# Patient Record
Sex: Male | Born: 1935 | Race: White | Hispanic: No | Smoking: Never smoker
Health system: Southern US, Community
[De-identification: ages and names within clinical notes are randomized; demographics above are authoritative.]

## PROBLEM LIST (undated history)

## (undated) DIAGNOSIS — Z87442 Personal history of urinary calculi: Secondary | ICD-10-CM

## (undated) DIAGNOSIS — I219 Acute myocardial infarction, unspecified: Secondary | ICD-10-CM

## (undated) DIAGNOSIS — I2510 Atherosclerotic heart disease of native coronary artery without angina pectoris: Secondary | ICD-10-CM

## (undated) DIAGNOSIS — E663 Overweight: Secondary | ICD-10-CM

## (undated) DIAGNOSIS — H9190 Unspecified hearing loss, unspecified ear: Secondary | ICD-10-CM

## (undated) DIAGNOSIS — I10 Essential (primary) hypertension: Secondary | ICD-10-CM

## (undated) DIAGNOSIS — I4891 Unspecified atrial fibrillation: Secondary | ICD-10-CM

## (undated) DIAGNOSIS — D699 Hemorrhagic condition, unspecified: Secondary | ICD-10-CM

## (undated) DIAGNOSIS — E785 Hyperlipidemia, unspecified: Secondary | ICD-10-CM

## (undated) DIAGNOSIS — N200 Calculus of kidney: Secondary | ICD-10-CM

## (undated) DIAGNOSIS — I509 Heart failure, unspecified: Secondary | ICD-10-CM

## (undated) HISTORY — DX: Hyperlipidemia, unspecified: E78.5

## (undated) HISTORY — DX: Calculus of kidney: N20.0

## (undated) HISTORY — DX: Heart failure, unspecified: I50.9

## (undated) HISTORY — DX: Hemorrhagic condition, unspecified: D69.9

## (undated) HISTORY — DX: Unspecified atrial fibrillation: I48.91

## (undated) HISTORY — DX: Essential (primary) hypertension: I10

## (undated) HISTORY — DX: Atherosclerotic heart disease of native coronary artery without angina pectoris: I25.10

## (undated) HISTORY — DX: Overweight: E66.3

---

## 1989-06-30 HISTORY — PX: ANGIOPLASTY: SHX39

## 2004-07-02 ENCOUNTER — Ambulatory Visit: Payer: Self-pay | Admitting: *Deleted

## 2005-04-28 ENCOUNTER — Inpatient Hospital Stay (HOSPITAL_COMMUNITY): Admission: EM | Admit: 2005-04-28 | Discharge: 2005-05-08 | Payer: Self-pay | Admitting: Emergency Medicine

## 2005-04-28 ENCOUNTER — Encounter: Payer: Self-pay | Admitting: Emergency Medicine

## 2005-04-29 ENCOUNTER — Ambulatory Visit: Payer: Self-pay | Admitting: Cardiology

## 2005-04-29 ENCOUNTER — Encounter: Payer: Self-pay | Admitting: Cardiology

## 2005-04-29 ENCOUNTER — Ambulatory Visit: Payer: Self-pay | Admitting: *Deleted

## 2005-04-30 HISTORY — PX: CORONARY ARTERY BYPASS GRAFT: SHX141

## 2005-05-20 ENCOUNTER — Ambulatory Visit (HOSPITAL_COMMUNITY): Admission: RE | Admit: 2005-05-20 | Discharge: 2005-05-20 | Payer: Self-pay | Admitting: *Deleted

## 2005-05-20 ENCOUNTER — Ambulatory Visit: Payer: Self-pay | Admitting: *Deleted

## 2005-06-02 ENCOUNTER — Encounter (HOSPITAL_COMMUNITY): Admission: RE | Admit: 2005-06-02 | Discharge: 2005-06-27 | Payer: Self-pay | Admitting: *Deleted

## 2005-06-06 ENCOUNTER — Ambulatory Visit: Payer: Self-pay | Admitting: Cardiology

## 2005-06-11 ENCOUNTER — Ambulatory Visit: Payer: Self-pay | Admitting: *Deleted

## 2005-07-02 ENCOUNTER — Ambulatory Visit: Payer: Self-pay | Admitting: *Deleted

## 2005-07-02 ENCOUNTER — Ambulatory Visit (HOSPITAL_COMMUNITY): Admission: RE | Admit: 2005-07-02 | Discharge: 2005-07-02 | Payer: Self-pay | Admitting: *Deleted

## 2005-07-04 ENCOUNTER — Encounter (HOSPITAL_COMMUNITY): Admission: RE | Admit: 2005-07-04 | Discharge: 2005-08-03 | Payer: Self-pay | Admitting: *Deleted

## 2005-08-04 ENCOUNTER — Encounter (HOSPITAL_COMMUNITY): Admission: RE | Admit: 2005-08-04 | Discharge: 2005-09-03 | Payer: Self-pay | Admitting: *Deleted

## 2005-08-06 ENCOUNTER — Ambulatory Visit: Payer: Self-pay | Admitting: *Deleted

## 2005-09-02 ENCOUNTER — Ambulatory Visit: Payer: Self-pay | Admitting: *Deleted

## 2005-09-05 ENCOUNTER — Encounter (HOSPITAL_COMMUNITY): Admission: RE | Admit: 2005-09-05 | Discharge: 2005-10-05 | Payer: Self-pay | Admitting: *Deleted

## 2005-09-08 ENCOUNTER — Ambulatory Visit: Payer: Self-pay | Admitting: Cardiology

## 2005-09-23 ENCOUNTER — Ambulatory Visit: Payer: Self-pay | Admitting: *Deleted

## 2005-09-26 ENCOUNTER — Ambulatory Visit: Payer: Self-pay | Admitting: Cardiology

## 2005-09-26 ENCOUNTER — Ambulatory Visit (HOSPITAL_COMMUNITY): Admission: RE | Admit: 2005-09-26 | Discharge: 2005-09-26 | Payer: Self-pay | Admitting: *Deleted

## 2005-10-14 ENCOUNTER — Ambulatory Visit: Payer: Self-pay | Admitting: *Deleted

## 2005-10-28 ENCOUNTER — Ambulatory Visit: Payer: Self-pay | Admitting: *Deleted

## 2005-11-21 ENCOUNTER — Ambulatory Visit: Payer: Self-pay | Admitting: *Deleted

## 2005-12-08 ENCOUNTER — Ambulatory Visit: Payer: Self-pay | Admitting: *Deleted

## 2006-01-01 ENCOUNTER — Ambulatory Visit: Payer: Self-pay | Admitting: Internal Medicine

## 2006-01-06 ENCOUNTER — Ambulatory Visit: Payer: Self-pay | Admitting: Cardiology

## 2006-02-19 ENCOUNTER — Ambulatory Visit: Payer: Self-pay | Admitting: Cardiology

## 2006-03-11 ENCOUNTER — Ambulatory Visit: Payer: Self-pay | Admitting: Cardiology

## 2006-04-09 ENCOUNTER — Ambulatory Visit: Payer: Self-pay | Admitting: Cardiology

## 2006-04-25 ENCOUNTER — Emergency Department (HOSPITAL_COMMUNITY): Admission: EM | Admit: 2006-04-25 | Discharge: 2006-04-25 | Payer: Self-pay | Admitting: Obstetrics & Gynecology

## 2006-05-12 ENCOUNTER — Ambulatory Visit: Payer: Self-pay | Admitting: Cardiology

## 2006-06-09 ENCOUNTER — Ambulatory Visit: Payer: Self-pay | Admitting: Cardiology

## 2006-06-30 HISTORY — PX: COLONOSCOPY: SHX174

## 2006-07-07 ENCOUNTER — Ambulatory Visit: Payer: Self-pay | Admitting: Cardiology

## 2006-08-11 ENCOUNTER — Ambulatory Visit: Payer: Self-pay | Admitting: Cardiology

## 2006-08-12 ENCOUNTER — Ambulatory Visit (HOSPITAL_COMMUNITY): Admission: RE | Admit: 2006-08-12 | Discharge: 2006-08-12 | Payer: Self-pay | Admitting: Cardiology

## 2006-09-14 ENCOUNTER — Ambulatory Visit: Payer: Self-pay | Admitting: Cardiology

## 2006-09-16 ENCOUNTER — Ambulatory Visit: Payer: Self-pay | Admitting: Internal Medicine

## 2006-09-16 ENCOUNTER — Ambulatory Visit (HOSPITAL_COMMUNITY): Admission: RE | Admit: 2006-09-16 | Discharge: 2006-09-16 | Payer: Self-pay | Admitting: Internal Medicine

## 2006-09-25 ENCOUNTER — Ambulatory Visit: Payer: Self-pay | Admitting: Cardiology

## 2006-10-08 ENCOUNTER — Ambulatory Visit: Payer: Self-pay | Admitting: Cardiology

## 2006-11-09 ENCOUNTER — Ambulatory Visit: Payer: Self-pay | Admitting: Cardiology

## 2006-11-20 ENCOUNTER — Emergency Department (HOSPITAL_COMMUNITY): Admission: EM | Admit: 2006-11-20 | Discharge: 2006-11-20 | Payer: Self-pay | Admitting: Emergency Medicine

## 2006-12-14 ENCOUNTER — Ambulatory Visit: Payer: Self-pay | Admitting: Cardiology

## 2006-12-22 ENCOUNTER — Ambulatory Visit: Payer: Self-pay | Admitting: Cardiology

## 2007-01-19 ENCOUNTER — Emergency Department (HOSPITAL_COMMUNITY): Admission: EM | Admit: 2007-01-19 | Discharge: 2007-01-19 | Payer: Self-pay | Admitting: Emergency Medicine

## 2007-01-20 ENCOUNTER — Ambulatory Visit: Payer: Self-pay | Admitting: Cardiovascular Disease

## 2007-02-17 ENCOUNTER — Ambulatory Visit: Payer: Self-pay | Admitting: Cardiology

## 2007-03-22 ENCOUNTER — Ambulatory Visit: Payer: Self-pay | Admitting: Cardiology

## 2007-04-26 ENCOUNTER — Ambulatory Visit: Payer: Self-pay | Admitting: Internal Medicine

## 2007-05-05 ENCOUNTER — Ambulatory Visit: Payer: Self-pay | Admitting: Cardiology

## 2007-05-12 ENCOUNTER — Ambulatory Visit: Payer: Self-pay | Admitting: Cardiovascular Disease

## 2007-05-24 ENCOUNTER — Encounter (HOSPITAL_COMMUNITY): Admission: RE | Admit: 2007-05-24 | Discharge: 2007-06-23 | Payer: Self-pay | Admitting: Cardiology

## 2007-05-27 ENCOUNTER — Ambulatory Visit: Payer: Self-pay | Admitting: Cardiology

## 2007-07-05 ENCOUNTER — Ambulatory Visit: Payer: Self-pay | Admitting: Cardiology

## 2008-02-28 ENCOUNTER — Ambulatory Visit: Payer: Self-pay | Admitting: Cardiology

## 2009-03-06 ENCOUNTER — Encounter: Payer: Self-pay | Admitting: Cardiology

## 2009-03-06 ENCOUNTER — Encounter (INDEPENDENT_AMBULATORY_CARE_PROVIDER_SITE_OTHER): Payer: Self-pay | Admitting: *Deleted

## 2009-03-06 LAB — CONVERTED CEMR LAB
ALT: 15 units/L
ALT: 15 units/L (ref 0–53)
AST: 16 units/L
AST: 16 units/L (ref 0–37)
Albumin: 4 g/dL
Albumin: 4 g/dL (ref 3.5–5.2)
Alkaline Phosphatase: 80 units/L
Alkaline Phosphatase: 80 units/L (ref 39–117)
BUN: 18 mg/dL
BUN: 18 mg/dL (ref 6–23)
CO2: 25 meq/L
CO2: 25 meq/L (ref 19–32)
Calcium: 8.8 mg/dL
Calcium: 8.8 mg/dL (ref 8.4–10.5)
Chloride: 105 meq/L
Chloride: 105 meq/L (ref 96–112)
Cholesterol: 134 mg/dL
Cholesterol: 134 mg/dL (ref 0–200)
Creatinine, Ser: 1.04 mg/dL
Creatinine, Ser: 1.04 mg/dL (ref 0.40–1.50)
Glucose, Bld: 115 mg/dL
Glucose, Bld: 115 mg/dL — ABNORMAL HIGH (ref 70–99)
HDL: 48 mg/dL
HDL: 48 mg/dL (ref >39)
LDL Cholesterol: 70 mg/dL
LDL Cholesterol: 70 mg/dL (ref 0–99)
Potassium: 4.9 meq/L
Potassium: 4.9 meq/L (ref 3.5–5.3)
Sodium: 140 meq/L
Sodium: 140 meq/L (ref 135–145)
Total Bilirubin: 0.9 mg/dL (ref 0.3–1.2)
Total CHOL/HDL Ratio: 2.8
Total Protein: 7.1 g/dL
Total Protein: 7.1 g/dL (ref 6.0–8.3)
Triglycerides: 81 mg/dL
Triglycerides: 81 mg/dL (ref <150)
VLDL: 16 mg/dL (ref 0–40)

## 2009-03-07 ENCOUNTER — Encounter (INDEPENDENT_AMBULATORY_CARE_PROVIDER_SITE_OTHER): Payer: Self-pay | Admitting: *Deleted

## 2009-07-23 ENCOUNTER — Telehealth (INDEPENDENT_AMBULATORY_CARE_PROVIDER_SITE_OTHER): Payer: Self-pay

## 2009-07-25 ENCOUNTER — Encounter (INDEPENDENT_AMBULATORY_CARE_PROVIDER_SITE_OTHER): Payer: Self-pay | Admitting: *Deleted

## 2009-08-07 ENCOUNTER — Ambulatory Visit: Payer: Self-pay | Admitting: Cardiology

## 2009-08-07 ENCOUNTER — Encounter: Payer: Self-pay | Admitting: Adult Health

## 2009-08-07 DIAGNOSIS — I10 Essential (primary) hypertension: Secondary | ICD-10-CM

## 2009-08-14 ENCOUNTER — Encounter: Payer: Self-pay | Admitting: Adult Health

## 2009-08-14 ENCOUNTER — Encounter (INDEPENDENT_AMBULATORY_CARE_PROVIDER_SITE_OTHER): Payer: Self-pay | Admitting: *Deleted

## 2009-08-14 LAB — CONVERTED CEMR LAB
ALT: 16 units/L
ALT: 16 units/L
ALT: 16 units/L (ref 0–53)
AST: 16 units/L
AST: 16 units/L
AST: 16 units/L (ref 0–37)
Albumin: 4 g/dL
Albumin: 4 g/dL
Albumin: 4 g/dL (ref 3.5–5.2)
Alkaline Phosphatase: 88 units/L
Alkaline Phosphatase: 88 units/L
Alkaline Phosphatase: 88 units/L (ref 39–117)
BUN: 25 mg/dL
BUN: 25 mg/dL
BUN: 25 mg/dL — ABNORMAL HIGH (ref 6–23)
Bilirubin, Direct: 0.1 mg/dL
Bilirubin, Direct: 0.1 mg/dL
Bilirubin, Direct: 0.1 mg/dL (ref 0.0–0.3)
CO2: 25 meq/L
CO2: 25 meq/L
CO2: 25 meq/L (ref 19–32)
Calcium: 8.9 mg/dL
Calcium: 8.9 mg/dL
Calcium: 8.9 mg/dL (ref 8.4–10.5)
Chloride: 105 meq/L
Chloride: 105 meq/L
Chloride: 105 meq/L (ref 96–112)
Cholesterol: 127 mg/dL
Cholesterol: 127 mg/dL
Cholesterol: 127 mg/dL (ref 0–200)
Creatinine, Ser: 0.96 mg/dL
Creatinine, Ser: 0.96 mg/dL
Creatinine, Ser: 0.96 mg/dL (ref 0.40–1.50)
Glucose, Bld: 109 mg/dL
Glucose, Bld: 109 mg/dL
Glucose, Bld: 109 mg/dL — ABNORMAL HIGH (ref 70–99)
HDL: 42 mg/dL
HDL: 42 mg/dL
HDL: 42 mg/dL (ref >39)
Indirect Bilirubin: 0.5 mg/dL (ref 0.0–0.9)
LDL Cholesterol: 73 mg/dL
LDL Cholesterol: 73 mg/dL
LDL Cholesterol: 73 mg/dL (ref 0–99)
Potassium: 4.7 meq/L
Potassium: 4.7 meq/L
Potassium: 4.7 meq/L (ref 3.5–5.3)
Sodium: 141 meq/L
Sodium: 141 meq/L
Sodium: 141 meq/L (ref 135–145)
Total Bilirubin: 0.6 mg/dL (ref 0.3–1.2)
Total CHOL/HDL Ratio: 3
Total Protein: 6.9 g/dL
Total Protein: 6.9 g/dL
Total Protein: 6.9 g/dL (ref 6.0–8.3)
Triglycerides: 61 mg/dL
Triglycerides: 61 mg/dL
Triglycerides: 61 mg/dL (ref <150)
VLDL: 12 mg/dL (ref 0–40)

## 2009-09-10 ENCOUNTER — Encounter (INDEPENDENT_AMBULATORY_CARE_PROVIDER_SITE_OTHER): Payer: Self-pay | Admitting: *Deleted

## 2009-09-13 ENCOUNTER — Ambulatory Visit: Payer: Self-pay | Admitting: Cardiology

## 2009-09-13 LAB — CONVERTED CEMR LAB: POC INR: 1.1

## 2009-09-24 ENCOUNTER — Ambulatory Visit: Payer: Self-pay | Admitting: Cardiology

## 2009-09-24 LAB — CONVERTED CEMR LAB: POC INR: 2.9

## 2009-10-04 ENCOUNTER — Ambulatory Visit: Payer: Self-pay | Admitting: Cardiology

## 2009-10-04 LAB — CONVERTED CEMR LAB: POC INR: 5

## 2009-10-11 ENCOUNTER — Ambulatory Visit: Payer: Self-pay | Admitting: Cardiology

## 2009-10-11 LAB — CONVERTED CEMR LAB: POC INR: 2.1

## 2009-10-25 ENCOUNTER — Ambulatory Visit: Payer: Self-pay | Admitting: Cardiology

## 2009-10-25 LAB — CONVERTED CEMR LAB: POC INR: 2.4

## 2009-11-15 ENCOUNTER — Ambulatory Visit: Payer: Self-pay | Admitting: Cardiology

## 2009-11-15 LAB — CONVERTED CEMR LAB: POC INR: 2.8

## 2009-12-13 ENCOUNTER — Ambulatory Visit: Payer: Self-pay | Admitting: Cardiology

## 2009-12-13 ENCOUNTER — Encounter (INDEPENDENT_AMBULATORY_CARE_PROVIDER_SITE_OTHER): Payer: Self-pay | Admitting: *Deleted

## 2009-12-13 LAB — CONVERTED CEMR LAB: POC INR: 2

## 2009-12-18 DIAGNOSIS — E663 Overweight: Secondary | ICD-10-CM | POA: Insufficient documentation

## 2009-12-18 DIAGNOSIS — E785 Hyperlipidemia, unspecified: Secondary | ICD-10-CM | POA: Insufficient documentation

## 2009-12-18 DIAGNOSIS — N200 Calculus of kidney: Secondary | ICD-10-CM | POA: Insufficient documentation

## 2009-12-19 ENCOUNTER — Ambulatory Visit: Payer: Self-pay | Admitting: Cardiology

## 2009-12-19 ENCOUNTER — Encounter (INDEPENDENT_AMBULATORY_CARE_PROVIDER_SITE_OTHER): Payer: Self-pay | Admitting: *Deleted

## 2009-12-19 LAB — CONVERTED CEMR LAB
Basophils Absolute: 0 10*3/uL
Basophils Absolute: 0 10*3/uL (ref 0.0–0.1)
Basophils Relative: 0 %
Basophils Relative: 0 % (ref 0–1)
Eosinophils Absolute: 0.3 10*3/uL (ref 0.0–0.7)
Eosinophils Relative: 3 %
Eosinophils Relative: 3 % (ref 0–5)
HCT: 44.6 %
HCT: 44.6 % (ref 39.0–52.0)
Hemoglobin: 14.9 g/dL
Hemoglobin: 14.9 g/dL (ref 13.0–17.0)
Lymphocytes Relative: 28 % (ref 12–46)
Lymphs Abs: 2.3 10*3/uL (ref 0.7–4.0)
MCHC: 33.4 g/dL (ref 30.0–36.0)
MCV: 96.5 fL
MCV: 96.5 fL (ref 78.0–100.0)
Monocytes Absolute: 0.9 10*3/uL
Monocytes Absolute: 0.9 10*3/uL (ref 0.1–1.0)
Monocytes Relative: 11 % (ref 3–12)
Neutro Abs: 4.6 10*3/uL (ref 1.7–7.7)
Neutrophils Relative %: 58 % (ref 43–77)
Platelets: 201 10*3/uL (ref 150–400)
RBC: 4.62 M/uL
RBC: 4.62 M/uL (ref 4.22–5.81)
RDW: 14.8 % (ref 11.5–15.5)
WBC: 8.1 10*3/uL
WBC: 8.1 10*3/uL (ref 4.0–10.5)

## 2009-12-20 ENCOUNTER — Encounter: Payer: Self-pay | Admitting: Cardiology

## 2009-12-28 ENCOUNTER — Encounter: Payer: Self-pay | Admitting: Cardiology

## 2009-12-28 ENCOUNTER — Ambulatory Visit: Payer: Self-pay | Admitting: Internal Medicine

## 2009-12-28 ENCOUNTER — Ambulatory Visit (HOSPITAL_COMMUNITY): Admission: RE | Admit: 2009-12-28 | Discharge: 2009-12-28 | Payer: Self-pay | Admitting: Cardiology

## 2010-01-09 ENCOUNTER — Ambulatory Visit: Payer: Self-pay | Admitting: Cardiology

## 2010-01-09 LAB — CONVERTED CEMR LAB: POC INR: 2.3

## 2010-02-07 ENCOUNTER — Ambulatory Visit: Payer: Self-pay | Admitting: Cardiology

## 2010-02-07 LAB — CONVERTED CEMR LAB: POC INR: 2.4

## 2010-03-01 ENCOUNTER — Encounter (INDEPENDENT_AMBULATORY_CARE_PROVIDER_SITE_OTHER): Payer: Self-pay | Admitting: *Deleted

## 2010-03-07 ENCOUNTER — Ambulatory Visit: Payer: Self-pay | Admitting: Cardiology

## 2010-04-04 ENCOUNTER — Ambulatory Visit: Payer: Self-pay | Admitting: Cardiology

## 2010-05-02 ENCOUNTER — Ambulatory Visit: Payer: Self-pay | Admitting: Cardiology

## 2010-06-03 ENCOUNTER — Ambulatory Visit: Payer: Self-pay | Admitting: Cardiology

## 2010-06-10 ENCOUNTER — Ambulatory Visit: Payer: Self-pay | Admitting: Cardiology

## 2010-06-19 ENCOUNTER — Ambulatory Visit: Payer: Self-pay | Admitting: Cardiology

## 2010-06-19 LAB — CONVERTED CEMR LAB: POC INR: 3.3

## 2010-07-09 ENCOUNTER — Encounter (INDEPENDENT_AMBULATORY_CARE_PROVIDER_SITE_OTHER): Payer: Self-pay | Admitting: *Deleted

## 2010-07-10 ENCOUNTER — Ambulatory Visit: Admission: RE | Admit: 2010-07-10 | Discharge: 2010-07-10 | Payer: Self-pay | Source: Home / Self Care

## 2010-07-10 ENCOUNTER — Encounter (INDEPENDENT_AMBULATORY_CARE_PROVIDER_SITE_OTHER): Payer: Self-pay | Admitting: *Deleted

## 2010-07-10 ENCOUNTER — Ambulatory Visit
Admission: RE | Admit: 2010-07-10 | Discharge: 2010-07-10 | Payer: Self-pay | Source: Home / Self Care | Attending: Cardiology | Admitting: Cardiology

## 2010-07-11 ENCOUNTER — Encounter: Payer: Self-pay | Admitting: Cardiology

## 2010-07-15 LAB — CONVERTED CEMR LAB
AST: 16 units/L (ref 0–37)
BUN: 20 mg/dL (ref 6–23)
Calcium: 8.9 mg/dL (ref 8.4–10.5)
Chloride: 102 meq/L (ref 96–112)
Creatinine, Ser: 1.02 mg/dL (ref 0.40–1.50)
HDL: 44 mg/dL (ref >39)
TSH: 1.247 microintl units/mL (ref 0.350–4.500)
Total CHOL/HDL Ratio: 3.3

## 2010-08-01 NOTE — Medication Information (Signed)
Summary: ccr-lr  Anticoagulant Therapy  Managed by: Vashti Hey, RN PCP: Dr.Scott Gerda Diss Supervising MD: Daleen Squibb MD, Maisie Fus Indication 1: Atrial Fibrillation Lab Used: [facility] [place] Site: [place] INR POC 1.2  Dietary changes: no    Health status changes: no    Bleeding/hemorrhagic complications: no    Recent/future hospitalizations: no    Any changes in medication regimen? no    Recent/future dental: no  Any missed doses?: yes     Details: denies missing doses or med changes  Is patient compliant with meds? yes       Allergies: 1)  ! Ibuprofen  Anticoagulation Management History:      The patient is taking warfarin and comes in today for a routine follow up visit.  Warfarin therapy is being given due to chronic atrial fibrillation.  Positive risk factors for bleeding include an age of 75 years or older.  Negative risk factors for bleeding include no history of CVA/TIA, no history of GI bleeding, and absence of serious comorbidities.  The bleeding index is 'intermediate risk'.  Positive CHADS2 values include History of HTN and Age > 39 years old.  Negative CHADS2 values include History of CHF, History of Diabetes, and Prior Stroke/CVA/TIA.  The start date was 09/13/2009.  Anticoagulation responsible provider: Daleen Squibb MD, Maisie Fus.  INR POC: 1.2.  Cuvette Lot#: [id number].  Exp: 10/2010.    Anticoagulation Management Assessment/Plan:      The patient's current anticoagulation dose is Warfarin sodium 5 mg tabs: take 1 tablet as directed by coumadin clinic.  The target INR is 2.0-3.0.  The next INR is due 06/10/2010.  Anticoagulation instructions were given to patient.  Results were reviewed/authorized by Vashti Hey, RN.  He was notified by Vashti Hey RN.         Prior Anticoagulation Instructions: INR 2.0 Continue coumadin 5mg  once daily except 2.5mg  on Sundays, Tuesdays and Thursdays  Current Anticoagulation Instructions: INR 1.2 Take coumadin 1 1/2 tablets tonight  and tomorrow night then increase dose to 5mg  once daily except 2.5mg  on Sundays and Thursdays

## 2010-08-01 NOTE — Letter (Signed)
Summary: [place] Results Engineer, agricultural at [facility]  [address] [place] [address]   Phone: [phone]  Fax: [phone]      March 01, 2010 MRN: [id number]   [name] [address] [place], [place]  [address]   Dear [name],  Your test ordered by Selena Batten has been reviewed by your physician (or physician assistant) and was found to be normal or stable. Your physician (or physician assistant) felt no changes were needed at this time.  __x__ Echocardiogram  ____ Cardiac Stress Test  ____ Lab Work  ____ Peripheral vascular study of arms, legs or neck  ____ CT scan or X-ray  ____ Lung or Breathing test  ____ Other:  No change in medical treatment at this time, per Dr. Dietrich Pates. Thank you, Tammy Allyne Gee RN    [place] Bing, MD, Lenise Arena.C.Gaylord Shih, MD, F.A.C.C Lewayne Bunting, MD, F.A.C.C Nona Dell, MD, F.A.C.C Charlton Haws, MD, Lenise Arena.C.C

## 2010-08-01 NOTE — Medication Information (Signed)
Summary: ccr-lr  Anticoagulant Therapy  Managed by: Vashti Hey, RN PCP: DR.Scott Gerda Diss Supervising MD: Dietrich Pates MD, Molly Maduro Indication 1: Atrial Fibrillation Lab Used: [facility] [place] Site: [place] INR POC 2.8  Dietary changes: no    Health status changes: no    Bleeding/hemorrhagic complications: no    Recent/future hospitalizations: no    Any changes in medication regimen? no    Recent/future dental: no  Any missed doses?: no       Is patient compliant with meds? yes       Allergies: No Known Drug Allergies  Anticoagulation Management History:      The patient is taking warfarin and comes in today for a routine follow up visit.  The patient is taking warfarin for chronic atrial fibrillation.  Positive risk factors for bleeding include an age of 75 years or older.  Negative risk factors for bleeding include no history of CVA/TIA, no history of GI bleeding, and absence of serious comorbidities.  The bleeding index is 'intermediate risk'.  Positive CHADS2 values include History of HTN and Age > 29 years old.  Negative CHADS2 values include History of CHF, History of Diabetes, and Prior Stroke/CVA/TIA.  The start date was 09/13/2009.  Anticoagulation responsible provider: Dietrich Pates MD, Molly Maduro.  INR POC: 2.8.  Cuvette Lot#: [id number].  Exp: 10/2010.    Anticoagulation Management Assessment/Plan:      The patient's current anticoagulation dose is Warfarin sodium 5 mg tabs: take 1 tablet as directed by coumadin clinic.  The target INR is 2.0-3.0.  The next INR is due 12/13/2009.  Anticoagulation instructions were given to patient.  Results were reviewed/authorized by Vashti Hey, RN.  He was notified by Vashti Hey RN.         Prior Anticoagulation Instructions: INR 2.4 Continue coumadin 5mg  once daily except 2.5mg  on Sundays, Tuesdays and Thursdays  Current Anticoagulation Instructions: INR 2.8 Continue coumadin 5mg  once daily except 2.5mg  on Sundays, Tuesdays  and Thursdays

## 2010-08-01 NOTE — Letter (Signed)
Summary: [place] [facility] Lab Work Engineer, agricultural at [employer]  [address], [place] [address]   Phone: [phone]  Fax: [phone]     July 10, 2010 MRN: [id number]   [name] [address] [place], [place]  [address]      YOUR LAB WORK IS DUE   MONDAY   July 15, 2010  Please go to [facility], located across the street from [facility] on the second floor.  Hours are Monday - Friday 7am until 7:30pm         Saturday 8am until 12noon    _X_  DO NOT EAT OR DRINK AFTER MIDNIGHT EVENING PRIOR TO LABWORK

## 2010-08-01 NOTE — Medication Information (Signed)
Summary: [facility]  Anticoagulant Therapy  Managed by: Vashti Hey, RN PCP: DR.Scott Gerda Diss Supervising MD: Dietrich Pates MD, Molly Maduro Indication 1: Atrial Fibrillation Lab Used: [facility] [place] Site: [place] INR POC 2.9  Dietary changes: no    Health status changes: no    Bleeding/hemorrhagic complications: no    Recent/future hospitalizations: no    Any changes in medication regimen? yes       Details: Restarted on coumadin 5mg  qd on 09/13/09  Has 5mg  tablet  Recent/future dental: no  Any missed doses?: no       Is patient compliant with meds? yes       Allergies: No Known Drug Allergies  Anticoagulation Management History:      The patient is taking warfarin and comes in today for a routine follow up visit.  The patient is taking warfarin for chronic atrial fibrillation.  Positive risk factors for bleeding include an age of 75 years or older.  Negative risk factors for bleeding include no history of CVA/TIA, no history of GI bleeding, and absence of serious comorbidities.  The bleeding index is 'intermediate risk'.  Positive CHADS2 values include History of HTN and Age > 75 years old.  Negative CHADS2 values include History of CHF, History of Diabetes, and Prior Stroke/CVA/TIA.  The start date was 09/13/2009.  Anticoagulation responsible provider: Dietrich Pates MD, Molly Maduro.  INR POC: 2.9.  Cuvette Lot#: [id number].  Exp: 10/2010.    Anticoagulation Management Assessment/Plan:      The patient's current anticoagulation dose is Warfarin sodium 5 mg tabs: take 1 tablet as directed by coumadin clinic.  The target INR is 2.0-3.0.  The next INR is due 10/04/2009.  Anticoagulation instructions were given to patient.  Results were reviewed/authorized by Vashti Hey, RN.  He was notified by Vashti Hey RN.         Prior Anticoagulation Instructions: INR 1.1 TODAY BEGIN COUMADIN AT 1 TABLET DAILY RECHECK IN 10 DAYS  Current Anticoagulation Instructions: INR 2.9 Continue coumadin 5mg   once daily  Prescriptions: METOPROLOL TARTRATE 100 MG TABS (METOPROLOL TARTRATE) Take 1 1/2 tabs two times a day  #90 x 3   Entered by:   Vashti Hey RN   Authorized by:   Kathlen Brunswick, MD, [facility]   Signed by:   Vashti Hey RN on 09/24/2009   Method used:   Electronically to        [facility]* (retail)       [address]       [place], [place]  [address]       Ph: [phone]       Fax: [phone]   RxID:   [id number]

## 2010-08-01 NOTE — Medication Information (Signed)
Summary: ccr-lr  Anticoagulant Therapy  Managed by: Vashti Hey, RN PCP: DR.Scott Gerda Diss Supervising MD: Dietrich Pates MD, Molly Maduro Indication 1: Atrial Fibrillation Lab Used: [facility] [place] Site:  INR POC 2.1  Dietary changes: no    Health status changes: no    Bleeding/hemorrhagic complications: no    Recent/future hospitalizations: no    Any changes in medication regimen? no    Recent/future dental: no  Any missed doses?: no       Is patient compliant with meds? yes       Allergies: No Known Drug Allergies  Anticoagulation Management History:      The patient is taking warfarin and comes in today for a routine follow up visit.  The patient is taking warfarin for chronic atrial fibrillation.  Positive risk factors for bleeding include an age of 75 years or older.  Negative risk factors for bleeding include no history of CVA/TIA, no history of GI bleeding, and absence of serious comorbidities.  The bleeding index is 'intermediate risk'.  Positive CHADS2 values include History of HTN and Age > 52 years old.  Negative CHADS2 values include History of CHF, History of Diabetes, and Prior Stroke/CVA/TIA.  The start date was 09/13/2009.  Anticoagulation responsible provider: Dietrich Pates MD, Molly Maduro.  INR POC: 2.1.  Exp: 10/2010.    Anticoagulation Management Assessment/Plan:      The patient's current anticoagulation dose is Warfarin sodium 5 mg tabs: take 1 tablet as directed by coumadin clinic.  The target INR is 2.0-3.0.  The next INR is due 10/25/2009.  Anticoagulation instructions were given to patient.  Results were reviewed/authorized by Vashti Hey, RN.  He was notified by Vashti Hey RN.         Prior Anticoagulation Instructions: INR 5.0 Hold coumadin tonight and tomorrow night then decrease dose to 2.5mg  once daily except 5mg  on Mondays, Wednesdays and Fridays  Current Anticoagulation Instructions: INR 2.1 Increase coumadin to 5mg  once daily except 2.5mg   on Sundays, Tuesdays and Thursdays

## 2010-08-01 NOTE — Medication Information (Signed)
Summary: ccr-lr  Anticoagulant Therapy  Managed by: Vashti Hey, RN PCP: Dr.Scott Gerda Diss Supervising MD: Dietrich Pates MD, Molly Maduro Indication 1: Atrial Fibrillation Lab Used: [facility] [place] Site: [place] INR POC 1.9  Dietary changes: no    Health status changes: no    Bleeding/hemorrhagic complications: no    Recent/future hospitalizations: no    Any changes in medication regimen? no    Recent/future dental: no  Any missed doses?: no       Is patient compliant with meds? yes       Allergies: 1)  ! Ibuprofen  Anticoagulation Management History:      The patient is taking warfarin and comes in today for a routine follow up visit.  He is being anticoagulated because of chronic atrial fibrillation.  Positive risk factors for bleeding include an age of 75 years or older.  Negative risk factors for bleeding include no history of CVA/TIA, no history of GI bleeding, and absence of serious comorbidities.  The bleeding index is 'intermediate risk'.  Positive CHADS2 values include History of HTN and Age > 75 years old.  Negative CHADS2 values include History of CHF, History of Diabetes, and Prior Stroke/CVA/TIA.  The start date was 09/13/2009.  Anticoagulation responsible provider: Dietrich Pates MD, Molly Maduro.  INR POC: 1.9.  Cuvette Lot#: [id number].  Exp: 10/2010.    Anticoagulation Management Assessment/Plan:      The patient's current anticoagulation dose is Warfarin sodium 5 mg tabs: take 1 tablet as directed by coumadin clinic.  The target INR is 2.0-3.0.  The next INR is due 04/04/2010.  Anticoagulation instructions were given to patient.  Results were reviewed/authorized by Vashti Hey, RN.  He was notified by Vashti Hey RN.         Prior Anticoagulation Instructions: INR 2.4 Continue coumadin 5mg  once daily except 2.5mg  on Sundays, Tuesdays and Thursdays  Current Anticoagulation Instructions: INR 1.9 Take coumadin 1 tablet tonight then resume 1 tablet once daily except 1/2  tablet on Sundays, Tuesdays and Thursdays

## 2010-08-01 NOTE — Medication Information (Signed)
Summary: ccr-lr  Anticoagulant Therapy  Managed by: Vashti Hey, RN PCP: Dr.Scott Gerda Diss Supervising MD: Dietrich Pates MD, Molly Maduro Indication 1: Atrial Fibrillation Lab Used: [facility] [place] Site: [place] INR POC 2.0  Dietary changes: no    Health status changes: no    Bleeding/hemorrhagic complications: no    Recent/future hospitalizations: no    Any changes in medication regimen? no    Recent/future dental: no  Any missed doses?: yes     Details: missed 1 dose yesterday  Is patient compliant with meds? yes       Allergies: 1)  ! Ibuprofen  Anticoagulation Management History:      The patient is taking warfarin and comes in today for a routine follow up visit.  Warfarin therapy is being given due to chronic atrial fibrillation.  Positive risk factors for bleeding include an age of 75 years or older.  Negative risk factors for bleeding include no history of CVA/TIA, no history of GI bleeding, and absence of serious comorbidities.  The bleeding index is 'intermediate risk'.  Positive CHADS2 values include History of HTN and Age > 62 years old.  Negative CHADS2 values include History of CHF, History of Diabetes, and Prior Stroke/CVA/TIA.  The start date was 09/13/2009.  Anticoagulation responsible [place]: Dietrich Pates MD, Molly Maduro.  INR POC: 2.0.  Cuvette Lot#: [id number].  Exp: 10/2010.    Anticoagulation Management Assessment/Plan:      The patient's current anticoagulation dose is Warfarin sodium 5 mg tabs: take 1 tablet as directed by coumadin clinic.  The target INR is 2.0-3.0.  The next INR is due 06/03/2010.  Anticoagulation instructions were given to patient.  Results were reviewed/authorized by Vashti Hey, RN.  He was notified by Vashti Hey RN.         Prior Anticoagulation Instructions: INR 2.5 Continue coumadin 5mg  once daily except 2.5mg  on Sundays, Tuesdays and Thursdays  Current Anticoagulation Instructions: INR 2.0 Continue coumadin 5mg  once daily except  2.5mg  on Sundays, Tuesdays and Thursdays

## 2010-08-01 NOTE — Medication Information (Signed)
Summary: ccr-lr  Anticoagulant Therapy  Managed by: Vashti Hey, RN PCP: Dr.Scott Gerda Diss Supervising MD: Dietrich Pates MD, Molly Maduro Indication 1: Atrial Fibrillation Lab Used: [facility] [place] Site: [place] INR POC 3.9  Dietary changes: no    Health status changes: no    Bleeding/hemorrhagic complications: no    Recent/future hospitalizations: no    Any changes in medication regimen? no    Recent/future dental: no  Any missed doses?: no       Is patient compliant with meds? yes       Allergies: 1)  ! Ibuprofen  Anticoagulation Management History:      The patient is taking warfarin and comes in today for a routine follow up visit.  Anticoagulation is being administered due to chronic atrial fibrillation.  Positive risk factors for bleeding include an age of 75 years or older.  Negative risk factors for bleeding include no history of CVA/TIA, no history of GI bleeding, and absence of serious comorbidities.  The bleeding index is 'intermediate risk'.  Positive CHADS2 values include History of HTN and Age > 58 years old.  Negative CHADS2 values include History of CHF, History of Diabetes, and Prior Stroke/CVA/TIA.  The start date was 09/13/2009.  Anticoagulation responsible provider: Dietrich Pates MD, Molly Maduro.  INR POC: 3.9.  Cuvette Lot#: [id number].  Exp: 10/2010.    Anticoagulation Management Assessment/Plan:      The patient's current anticoagulation dose is Warfarin sodium 5 mg tabs: take 1 tablet as directed by coumadin clinic.  The target INR is 2.0-3.0.  The next INR is due 08/07/2010.  Anticoagulation instructions were given to patient.  Results were reviewed/authorized by Vashti Hey, RN.  He was notified by Vashti Hey RN.         Prior Anticoagulation Instructions: INR 3.3 Decrease coumadin to 5mg  once daily except 2.5mg  on Wednesdays  Current Anticoagulation Instructions: INR 3.9 Hold coumadin tonight then decrease dose to 5mg  once daily except 2.5mg  on Mondays,  Wednesdays and Fridays

## 2010-08-01 NOTE — Progress Notes (Signed)
Summary: needs OV  Phone Note Outgoing Call   Call placed by: Larita Fife Via LPN,  July 23, 2009 3:18 PM Summary of Call: No answer, left message. Patient needs an OV so we can continue refills.  Follow-up for Phone Call        Patient scheduled appt. for 08-02-09. Follow-up by: Larita Fife Via LPN,  July 24, 2009 10:49 AM    New/Updated Medications: CADUET 5-20 MG TABS (AMLODIPINE-ATORVASTATIN) Take 1 tablet by mouth once a day Prescriptions: CADUET 5-20 MG TABS (AMLODIPINE-ATORVASTATIN) Take 1 tablet by mouth once a day  #30 x 0   Entered by:   Larita Fife Via LPN   Authorized by:   Kathlen Brunswick, MD, [facility]   Signed by:   Larita Fife Via LPN on 04/54/0981   Method used:   Electronically to        [facility]* (retail)       [address]       [place], [place]  [address]       Ph: [phone]       Fax: [phone]   RxID:   [id number]

## 2010-08-01 NOTE — Miscellaneous (Signed)
Summary: echo 12/28/2009  Clinical Lists Changes  Observations: Added new observation of ECHOINTERP:  Study Conclusions    - Left ventricle: Poor acoustic window liimits study. LV function is     at least mild to moderately depressed. ANterior wall is not well     seen to evaluate. 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 Cannot     evaluate further. The cavity size was normal. Wall thickness was     increased in a pattern of severe LVH.   - Left atrium: The atrium was severely dilated.   - Right atrium: The atrium was moderately dilated.    -------------------------------------------------------------------- (12/28/2009 9:31)      Echocardiogram  Procedure date:  12/28/2009  Findings:       Study Conclusions    - Left ventricle: Poor acoustic window liimits study. LV function is     at least mild to moderately depressed. ANterior wall is not well     seen to evaluate. 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 Cannot     evaluate further. The cavity size was normal. Wall thickness was     increased in a pattern of severe LVH.   - Left atrium: The atrium was severely dilated.   - Right atrium: The atrium was moderately dilated.    --------------------------------------------------------------------

## 2010-08-01 NOTE — Assessment & Plan Note (Signed)
Summary: past due for f/u per Lynn/tg   Visit Type:  Follow-up Primary Provider:  DR.Scott Luking   History of Present Illness: [name] is a 4 obese CM we are following for history of CAD, Afib, ischemic CM, and hyperlipidemia.  He was last seen per office records in August of 2009, but he insists that we saw him in May 2010.  He is not on coumadin and states we told him to stop taking it.  Last recorded use of coumadin was in 2007, where it was held for colonscopy, but should have been restarted.  He appears mildy confused as to dates of visits, and becomes very aggitated when he is informed of our documentation concerning his last visits and need to continue coumadin.  He is here for medication refills and has missed other follow-up appointments prior to this.  He states that he feels fine, no complaints of DOE, Chest pain, palpations or dizziness.  Review of his medication list with him reveals that he is not taking his medications as prescribed concerning lasix dose. Call to Dr. Fletcher Anon office also shows that he has not seen him since August of 2010.  He was not on coumadin at that time.  Current Medications (verified): 1)  Caduet 5-20 Mg Tabs (Amlodipine-Atorvastatin) .... Take 1 Tablet By Mouth Once A Day 2)  Furosemide 40 Mg Tabs (Furosemide) .... Take 1 Tablet By Mouth Once A Day 3)  Lisinopril 40 Mg Tabs (Lisinopril) .... Take 1 Tablet By Mouth Once A Day 4)  Metoprolol Tartrate 100 Mg Tabs (Metoprolol Tartrate) .... Take 1 1/2 Tablet By Mouth Twice A Day 5)  Simvastatin 5 Mg Tabs (Simvastatin) .... Take 1 Tablet By Mouth Once A Day 6)  Aspir-Low 81 Mg Tbec (Aspirin) .... Take 1 Tab Daily  Allergies (verified): No Known Drug Allergies  Review of Systems       All other systems have been reviewed and are negative unless stated above.   Vital Signs:  Patient profile:   77 year old male Height:      72 inches Weight:      278 pounds BMI:     37.84 Pulse rate:   56 /  minute BP sitting:   132 / 83  (right arm)  Vitals Entered By: Dreama Saa, CNA (August 07, 2009 1:49 PM)  Physical Exam  General:  Well developed, well nourished, in no acute distress. Head:  normocephalic and atraumatic Eyes:  PERRLA/EOM intact; conjunctiva and lids normal. Lungs:  Bilateral wheezes with occaisional cough Heart:  Irregular, bradycardic Abdomen:  Obese, NT 2+ BS Msk:  Back normal, normal gait. Muscle strength and tone normal. Extremities:  trace left pedal edema and trace right pedal edema.   Neurologic:  Alert and oriented x 3. Psych:  agitated.     Impression & Recommendations:  Problem # 1:  ATRIAL FIBRILLATION (ICD-427.31) I am concerned that he is not on coumadin. He is adament that we told him that he didn't need it.  I have no records to confirm this.  We will have to readdress this on next visit after investigating prior visits. His rate is controlled on current dose of Metoprolol and I have changed the dose in the med rec.  Will leave him on this dose for now. The following medications were removed from the medication list:    Amiodarone Hcl 400 Mg Tabs (Amiodarone hcl) .[name]... Take 1 tablet by mouth twice a day    Warfarin Sodium  2.5 Mg Tabs (Warfarin sodium) .[name]... 1 tablet His updated medication list for this problem includes:    Metoprolol Tartrate 100 Mg Tabs (Metoprolol tartrate) .[name]... Take 1 tablet by mouth twice a day    Aspir-low 81 Mg Tbec (Aspirin) .[name]... Take 1 tab daily  Future Orders: T-Basic Metabolic Panel [id number]) ... 08/14/2009  Problem # 2:  HYPERTENSION (ICD-401.9) Assessment: Unchanged No changes in his medications.  Will; check a BMEt in one week. He will follow in one month and refills for more than 30 days will be given at that time. The following medications were removed from the medication list:    Altace 5 Mg Caps (Ramipril) .[name]... Take 1 capsule by mouth once a day    Caduet 2.5-20 Mg Tabs (Amlodipine-atorvastatin) .[name]...  Take 1 tablet by mouth once a day His updated medication list for this problem includes:    Caduet 5-20 Mg Tabs (Amlodipine-atorvastatin) .[name]... Take 1 tablet by mouth once a day    Furosemide 40 Mg Tabs (Furosemide) .[name]... Take 1/2 tablet by mouth once a day    Lisinopril 40 Mg Tabs (Lisinopril) .[name]... Take 1 tablet by mouth once a day    Metoprolol Tartrate 100 Mg Tabs (Metoprolol tartrate) .[name]... Take 1 tablet by mouth twice a day    Aspir-low 81 Mg Tbec (Aspirin) .[name]... Take 1 tab daily  Future Orders: T-Lipid Profile ([id number]) ... 08/14/2009 T-Hepatic Function [phone]) ... 08/14/2009  Patient Instructions: 1)  Your physician recommends that you schedule a follow-up appointment in: 1 month 2)  Your physician recommends that you return for lab work in: 1 week 3)  Your physician has recommended you make the following change in your medication: Increase Furosemide to 40mg  (whole tablet) by mouth once daily  Prescriptions: FUROSEMIDE 40 MG TABS (FUROSEMIDE) Take 1 tablet by mouth once a day  #30 x 1   Entered by:   Larita Fife Via LPN   Authorized by:   Joni Reining, NP   Signed by:   Larita Fife Via LPN on 62/95/2841   Method used:   Electronically to        [facility]* (retail)       [address]       [place], [place]  [address]       Ph: [phone]       Fax: [phone]   RxID:   [id number] CADUET 5-20 MG TABS (AMLODIPINE-ATORVASTATIN) Take 1 tablet by mouth once a day  #30 x 1   Entered by:   Larita Fife Via LPN   Authorized by:   Joni Reining, NP   Signed by:   Larita Fife Via LPN on 16/60/6301   Method used:   Electronically to        [facility]* (retail)       [address]       [place], [place]  [address]       Ph: [phone]       Fax: [phone]   RxID:   [id number] LISINOPRIL 40 MG TABS (LISINOPRIL) Take 1 tablet by mouth once a day  #30 x 1   Entered by:   Larita Fife Via LPN   Authorized by:   Joni Reining, NP   Signed by:   Larita Fife Via LPN on 12/24/9483   Method used:   Electronically to        [facility]* (  retail)       [address]       [place], [place]  [address]       Ph: [phone]       Fax: [phone]   RxID:   [id number]

## 2010-08-01 NOTE — Medication Information (Signed)
Summary: ccr-lr  Anticoagulant Therapy  Managed by: Vashti Hey, RN PCP: Dr.Scott Gerda Diss Supervising MD: Dietrich Pates MD, Molly Maduro Indication 1: Atrial Fibrillation Lab Used: [facility] [place] Site: [place] INR POC 2.4  Dietary changes: no    Health status changes: no    Bleeding/hemorrhagic complications: no    Recent/future hospitalizations: no    Any changes in medication regimen? no    Recent/future dental: no  Any missed doses?: no       Is patient compliant with meds? yes       Allergies: 1)  ! Ibuprofen  Anticoagulation Management History:      The patient is taking warfarin and comes in today for a routine follow up visit.  He is being anticoagulated due to chronic atrial fibrillation.  Positive risk factors for bleeding include an age of 75 years or older.  Negative risk factors for bleeding include no history of CVA/TIA, no history of GI bleeding, and absence of serious comorbidities.  The bleeding index is 'intermediate risk'.  Positive CHADS2 values include History of HTN and Age > 35 years old.  Negative CHADS2 values include History of CHF, History of Diabetes, and Prior Stroke/CVA/TIA.  The start date was 09/13/2009.  Anticoagulation responsible provider: Dietrich Pates MD, Molly Maduro.  INR POC: 2.4.  Cuvette Lot#: [id number].  Exp: 10/2010.    Anticoagulation Management Assessment/Plan:      The patient's current anticoagulation dose is Warfarin sodium 5 mg tabs: take 1 tablet as directed by coumadin clinic.  The target INR is 2.0-3.0.  The next INR is due 03/07/2010.  Anticoagulation instructions were given to patient.  Results were reviewed/authorized by Vashti Hey, RN.  He was notified by Vashti Hey RN.         Prior Anticoagulation Instructions: INR 2.3 Continue coumadin 5mg  once daily except 2.5mg  on Sundays, Tuesdays and Thursdays  Current Anticoagulation Instructions: INR 2.4 Continue coumadin 5mg  once daily except 2.5mg  on Sundays, Tuesdays and  Thursdays

## 2010-08-01 NOTE — Medication Information (Signed)
Summary: ccr-lr  Anticoagulant Therapy  Managed by: Vashti Hey, RN PCP: Dr.Scott Gerda Diss Supervising MD: Dietrich Pates MD, Molly Maduro Indication 1: Atrial Fibrillation Lab Used: [facility] [place] Site: [place] INR POC 2.5  Dietary changes: no    Health status changes: no    Bleeding/hemorrhagic complications: no    Recent/future hospitalizations: no    Any changes in medication regimen? no    Recent/future dental: no  Any missed doses?: no       Is patient compliant with meds? yes       Allergies: 1)  ! Ibuprofen  Anticoagulation Management History:      The patient is taking warfarin and comes in today for a routine follow up visit.  He is being anticoagulated because of chronic atrial fibrillation.  Positive risk factors for bleeding include an age of 75 years or older.  Negative risk factors for bleeding include no history of CVA/TIA, no history of GI bleeding, and absence of serious comorbidities.  The bleeding index is 'intermediate risk'.  Positive CHADS2 values include History of HTN and Age > 3 years old.  Negative CHADS2 values include History of CHF, History of Diabetes, and Prior Stroke/CVA/TIA.  The start date was 09/13/2009.  Anticoagulation responsible provider: Dietrich Pates MD, Molly Maduro.  INR POC: 2.5.  Cuvette Lot#: [id number].  Exp: 10/2010.    Anticoagulation Management Assessment/Plan:      The patient's current anticoagulation dose is Warfarin sodium 5 mg tabs: take 1 tablet as directed by coumadin clinic.  The target INR is 2.0-3.0.  The next INR is due 05/02/2010.  Anticoagulation instructions were given to patient.  Results were reviewed/authorized by Vashti Hey, RN.  He was notified by Vashti Hey RN.         Prior Anticoagulation Instructions: INR 1.9 Take coumadin 1 tablet tonight then resume 1 tablet once daily except 1/2 tablet on Sundays, Tuesdays and Thursdays  Current Anticoagulation Instructions: INR 2.5 Continue coumadin 5mg  once daily except  2.5mg  on Sundays, Tuesdays and Thursdays

## 2010-08-01 NOTE — Miscellaneous (Signed)
Summary: labs bmp,lipid,liver,08/14/2009  Clinical Lists Changes  Observations: Added new observation of CALCIUM: 8.9 mg/dL (16/03/9603 54:09) Added new observation of ALBUMIN: 4.0 g/dL (81/19/1478 29:56) Added new observation of PROTEIN, TOT: 6.9 g/dL (21/30/8657 84:69) Added new observation of SGPT (ALT): 16 units/L (08/14/2009 14:58) Added new observation of SGOT (AST): 16 units/L (08/14/2009 14:58) Added new observation of ALK PHOS: 88 units/L (08/14/2009 14:58) Added new observation of BILI DIRECT: 0.1 mg/dL (62/95/2841 32:44) Added new observation of CREATININE: 0.96 mg/dL (07/02/7251 66:44) Added new observation of BUN: 25 mg/dL (03/47/4259 56:38) Added new observation of BG RANDOM: 109 mg/dL (75/64/3329 51:88) Added new observation of CO2 PLSM/SER: 25 meq/L (08/14/2009 14:58) Added new observation of CL SERUM: 105 meq/L (08/14/2009 14:58) Added new observation of K SERUM: 4.7 meq/L (08/14/2009 14:58) Added new observation of NA: 141 meq/L (08/14/2009 14:58) Added new observation of LDL: 73 mg/dL (41/66/0630 16:01) Added new observation of HDL: 42 mg/dL (09/32/3557 32:20) Added new observation of TRIGLYC TOT: 61 mg/dL (25/42/7062 37:62) Added new observation of CHOLESTEROL: 127 mg/dL (83/15/1761 60:73)  Appended Document: labs bmp,lipid,liver,08/14/2009    Clinical Lists Changes  Problems: Changed problem from ATRIAL FIBRILLATION (ICD-427.31) to ATRIAL FIBRILLATION-POSTOPERATIVE (ICD-427.31) Added new problem of ATHEROSCLEROTIC CARDIOVASCULAR DISEASE-CABG (ICD-429.2) Observations: Added new observation of PAST SURG HX: Coronary artery bypass graft surgery-2006 (09/11/2009 19:59) Added new observation of PAST MED HX: ASCVD: The coronary artery bypass graft surgery in 11/06; MUGA in 11/08-44% Atrial fibrillation-initially occurred following CABG; event recorder in 11/08-no AF Hypertension Hyperlipidemia Overweight Nephrolithiasis (09/11/2009 19:59) Added new observation of  PRIMARY MD: DR.Scott Luking (09/11/2009 19:59) Added new observation of CXR RESULTS: Post coronary artery bypass graft surgery Cardiomegaly Right lower lobe volume loss or scarring (01/19/2007 20:05) Added new observation of MRI BRAIN: No acute intracranial abnormality Chronic small vessel disease Right maxillary sinus fluid-possible sinusitis Mild atherosclerotic disease of the mid basilar and mid left posterior      cerebral arteries (01/19/2007 20:03) Added new observation of CARDCATHFIND: LV-115/19 with an left ventricular end-diastolic pressure of 37 RA-70 RV- 45/12 Left main-calcified; 60% lesion Left anterior descending-95% mid Circumflex 60% proximal; 95% second marginal RCA-95% proximal (04/29/2005 20:08)        MRI Brain  Procedure date:  01/19/2007  Findings:      No acute intracranial abnormality Chronic small vessel disease Right maxillary sinus fluid-possible sinusitis Mild atherosclerotic disease of the mid basilar and mid left posterior      cerebral arteries  CXR  Procedure date:  01/19/2007  Findings:      Post coronary artery bypass graft surgery Cardiomegaly Right lower lobe volume loss or scarring  Cardiac Cath  Procedure date:  04/29/2005  Findings:      LV-115/19 with an left ventricular end-diastolic pressure of 37 RA-70 RV- 45/12 Left main-calcified; 60% lesion Left anterior descending-95% mid Circumflex 60% proximal; 95% second marginal RCA-95% proximal   Past History:  Past Medical History: ASCVD: The coronary artery bypass graft surgery in 11/06; MUGA in 11/08-44% Atrial fibrillation-initially occurred following CABG; event recorder in 11/08-no AF Hypertension Hyperlipidemia Overweight Nephrolithiasis  Past Surgical History: Coronary artery bypass graft surgery-2006

## 2010-08-01 NOTE — Miscellaneous (Signed)
Summary: labs bmp,lipids,liver,08/14/2009  Clinical Lists Changes  Observations: Added new observation of CALCIUM: 8.9 mg/dL (45/40/9811 91:47) Added new observation of ALBUMIN: 4.0 g/dL (82/95/6213 08:65) Added new observation of PROTEIN, TOT: 6.9 g/dL (78/46/9629 52:84) Added new observation of SGPT (ALT): 16 units/L (08/14/2009 12:36) Added new observation of SGOT (AST): 16 units/L (08/14/2009 12:36) Added new observation of ALK PHOS: 88 units/L (08/14/2009 12:36) Added new observation of BILI DIRECT: 0.1 mg/dL (13/24/4010 27:25) Added new observation of CREATININE: 0.96 mg/dL (36/64/4034 74:25) Added new observation of BUN: 25 mg/dL (95/63/8756 43:32) Added new observation of BG RANDOM: 109 mg/dL (95/18/8416 60:63) Added new observation of CO2 PLSM/SER: 25 meq/L (08/14/2009 12:36) Added new observation of CL SERUM: 105 meq/L (08/14/2009 12:36) Added new observation of K SERUM: 4.7 meq/L (08/14/2009 12:36) Added new observation of NA: 141 meq/L (08/14/2009 12:36) Added new observation of LDL: 73 mg/dL (01/60/1093 23:55) Added new observation of HDL: 42 mg/dL (73/22/0254 27:06) Added new observation of TRIGLYC TOT: 61 mg/dL (23/76/2831 51:76) Added new observation of CHOLESTEROL: 127 mg/dL (16/12/3708 62:69)

## 2010-08-01 NOTE — Medication Information (Signed)
Summary: ccr-lr  Anticoagulant Therapy  Managed by: Vashti Hey, RN PCP: DR.Scott Gerda Diss Supervising MD: Dietrich Pates MD, Molly Maduro Indication 1: Atrial Fibrillation Lab Used: [facility] [place] Site: [place] INR POC 2.4  Dietary changes: no    Health status changes: no    Bleeding/hemorrhagic complications: no    Recent/future hospitalizations: no    Any changes in medication regimen? no    Recent/future dental: no  Any missed doses?: no       Is patient compliant with meds? yes       Allergies: No Known Drug Allergies  Anticoagulation Management History:      The patient is taking warfarin and comes in today for a routine follow up visit.  The patient is taking warfarin for chronic atrial fibrillation.  Positive risk factors for bleeding include an age of 75 years or older.  Negative risk factors for bleeding include no history of CVA/TIA, no history of GI bleeding, and absence of serious comorbidities.  The bleeding index is 'intermediate risk'.  Positive CHADS2 values include History of HTN and Age > 13 years old.  Negative CHADS2 values include History of CHF, History of Diabetes, and Prior Stroke/CVA/TIA.  The start date was 09/13/2009.  Anticoagulation responsible provider: Dietrich Pates MD, Molly Maduro.  INR POC: 2.4.  Cuvette Lot#: [id number].  Exp: 10/2010.    Anticoagulation Management Assessment/Plan:      The patient's current anticoagulation dose is Warfarin sodium 5 mg tabs: take 1 tablet as directed by coumadin clinic.  The target INR is 2.0-3.0.  The next INR is due 11/15/2009.  Anticoagulation instructions were given to patient.  Results were reviewed/authorized by Vashti Hey, RN.  He was notified by Vashti Hey RN.         Prior Anticoagulation Instructions: INR 2.1 Increase coumadin to 5mg  once daily except 2.5mg  on Sundays, Tuesdays and Thursdays  Current Anticoagulation Instructions: INR 2.4 Continue coumadin 5mg  once daily except 2.5mg  on Sundays, Tuesdays  and Thursdays

## 2010-08-01 NOTE — Medication Information (Signed)
Summary: CCR  Anticoagulant Therapy  Managed by: Vashti Hey, RN PCP: DR.Scott Gerda Diss Supervising MD: Dietrich Pates MD, Molly Maduro Indication 1: Atrial Fibrillation Lab Used: [facility] [place] Site: [place] INR POC 5.0  Dietary changes: no    Health status changes: no    Bleeding/hemorrhagic complications: no    Recent/future hospitalizations: no    Any changes in medication regimen? no    Recent/future dental: no  Any missed doses?: no       Is patient compliant with meds? yes       Allergies: No Known Drug Allergies  Anticoagulation Management History:      The patient is taking warfarin and comes in today for a routine follow up visit.  The patient is taking warfarin for chronic atrial fibrillation.  Positive risk factors for bleeding include an age of 75 years or older.  Negative risk factors for bleeding include no history of CVA/TIA, no history of GI bleeding, and absence of serious comorbidities.  The bleeding index is 'intermediate risk'.  Positive CHADS2 values include History of HTN and Age > 46 years old.  Negative CHADS2 values include History of CHF, History of Diabetes, and Prior Stroke/CVA/TIA.  The start date was 09/13/2009.  Anticoagulation responsible provider: Dietrich Pates MD, Molly Maduro.  INR POC: 5.0.  Cuvette Lot#: [id number].  Exp: 10/2010.    Anticoagulation Management Assessment/Plan:      The patient's current anticoagulation dose is Warfarin sodium 5 mg tabs: take 1 tablet as directed by coumadin clinic.  The target INR is 2.0-3.0.  The next INR is due 10/11/2009.  Anticoagulation instructions were given to patient.  Results were reviewed/authorized by Vashti Hey, RN.  He was notified by Vashti Hey RN.         Prior Anticoagulation Instructions: INR 2.9 Continue coumadin 5mg  once daily   Current Anticoagulation Instructions: INR 5.0 Hold coumadin tonight and tomorrow night then decrease dose to 2.5mg  once daily except 5mg  on Mondays, Wednesdays and  Fridays

## 2010-08-01 NOTE — Medication Information (Signed)
Summary: ccr-lr  Anticoagulant Therapy  Managed by: Vashti Hey, RN PCP: Dr.Scott Gerda Diss Supervising MD: Daleen Squibb MD, Maisie Fus Indication 1: Atrial Fibrillation Lab Used: [facility] [place] Site: [place] INR POC 1.6  Dietary changes: no    Health status changes: no    Bleeding/hemorrhagic complications: no    Recent/future hospitalizations: no    Any changes in medication regimen? no    Recent/future dental: no  Any missed doses?: no       Is patient compliant with meds? yes       Allergies: 1)  ! Ibuprofen  Anticoagulation Management History:      The patient is taking warfarin and comes in today for a routine follow up visit.  Warfarin therapy is being given due to chronic atrial fibrillation.  Positive risk factors for bleeding include an age of 75 years or older.  Negative risk factors for bleeding include no history of CVA/TIA, no history of GI bleeding, and absence of serious comorbidities.  The bleeding index is 'intermediate risk'.  Positive CHADS2 values include History of HTN and Age > 75 years old.  Negative CHADS2 values include History of CHF, History of Diabetes, and Prior Stroke/CVA/TIA.  The start date was 09/13/2009.  Anticoagulation responsible provider: Daleen Squibb MD, Maisie Fus.  INR POC: 1.6.  Cuvette Lot#: [id number].  Exp: 10/2010.    Anticoagulation Management Assessment/Plan:      The patient's current anticoagulation dose is Warfarin sodium 5 mg tabs: take 1 tablet as directed by coumadin clinic.  The target INR is 2.0-3.0.  The next INR is due 06/19/2010.  Anticoagulation instructions were given to patient.  Results were reviewed/authorized by Vashti Hey, RN.  He was notified by Vashti Hey RN.         Prior Anticoagulation Instructions: INR 1.2 Take coumadin 1 1/2 tablets tonight and tomorrow night then increase dose to 5mg  once daily except 2.5mg  on Sundays and Thursdays  Current Anticoagulation Instructions: INR 1.6 Take couamdin 1 1/2 tablets  tonight then increase dose to 5mg  once daily

## 2010-08-01 NOTE — Medication Information (Signed)
Summary: ccr-lr  Anticoagulant Therapy  Managed by: Vashti Hey, RN PCP: DR.Scott Gerda Diss Supervising MD: Dietrich Pates MD, Molly Maduro Indication 1: Atrial Fibrillation Lab Used: [facility] [place] Site:  INR POC 2.0  Dietary changes: no    Health status changes: no    Bleeding/hemorrhagic complications: no    Recent/future hospitalizations: no    Any changes in medication regimen? no    Recent/future dental: no  Any missed doses?: no       Is patient compliant with meds? yes       Allergies: No Known Drug Allergies  Anticoagulation Management History:      The patient is taking warfarin and comes in today for a routine follow up visit.  The patient is on warfarin for chronic atrial fibrillation.  Positive risk factors for bleeding include an age of 75 years or older.  Negative risk factors for bleeding include no history of CVA/TIA, no history of GI bleeding, and absence of serious comorbidities.  The bleeding index is 'intermediate risk'.  Positive CHADS2 values include History of HTN and Age > 25 years old.  Negative CHADS2 values include History of CHF, History of Diabetes, and Prior Stroke/CVA/TIA.  The start date was 09/13/2009.  Anticoagulation responsible provider: Dietrich Pates MD, Molly Maduro.  INR POC: 2.0.  Cuvette Lot#: [id number].  Exp: 10/2010.    Anticoagulation Management Assessment/Plan:      The patient's current anticoagulation dose is Warfarin sodium 5 mg tabs: take 1 tablet as directed by coumadin clinic.  The target INR is 2.0-3.0.  The next INR is due 01/09/2010.  Anticoagulation instructions were given to patient.  Results were reviewed/authorized by Vashti Hey, RN.  He was notified by Vashti Hey RN.         Prior Anticoagulation Instructions: INR 2.8 Continue coumadin 5mg  once daily except 2.5mg  on Sundays, Tuesdays and Thursdays  Current Anticoagulation Instructions: INR 2.0 Take coumadin 5mg  tonight then resume 5mg  once daily except 2.5mg  on  Sundays, Tuesdays and Thursdays

## 2010-08-01 NOTE — Medication Information (Signed)
Summary: ccr-lr  Anticoagulant Therapy  Managed by: Vashti Hey, RN PCP: Dr.Scott Gerda Diss Supervising MD: Diona Browner MD, Remi Deter Indication 1: Atrial Fibrillation Lab Used: [facility] [place] Site: [place] INR POC 2.3  Dietary changes: no    Health status changes: no    Bleeding/hemorrhagic complications: no    Recent/future hospitalizations: no    Any changes in medication regimen? no    Recent/future dental: no  Any missed doses?: no       Is patient compliant with meds? yes       Allergies: 1)  ! Ibuprofen  Anticoagulation Management History:      The patient is taking warfarin and comes in today for a routine follow up visit.  He is being anticoagulated due to chronic atrial fibrillation.  Positive risk factors for bleeding include an age of 75 years or older.  Negative risk factors for bleeding include no history of CVA/TIA, no history of GI bleeding, and absence of serious comorbidities.  The bleeding index is 'intermediate risk'.  Positive CHADS2 values include History of HTN and Age > 105 years old.  Negative CHADS2 values include History of CHF, History of Diabetes, and Prior Stroke/CVA/TIA.  The start date was 09/13/2009.  Anticoagulation responsible provider: Diona Browner MD, Remi Deter.  INR POC: 2.3.  Cuvette Lot#: [id number].  Exp: 10/2010.    Anticoagulation Management Assessment/Plan:      The patient's current anticoagulation dose is Warfarin sodium 5 mg tabs: take 1 tablet as directed by coumadin clinic.  The target INR is 2.0-3.0.  The next INR is due 02/07/2010.  Anticoagulation instructions were given to patient.  Results were reviewed/authorized by Vashti Hey, RN.  He was notified by Vashti Hey RN.         Prior Anticoagulation Instructions: INR 2.0 Take coumadin 5mg  tonight then resume 5mg  once daily except 2.5mg  on Sundays, Tuesdays and Thursdays  Current Anticoagulation Instructions: INR 2.3 Continue coumadin 5mg  once daily except 2.5mg  on Sundays,  Tuesdays and Thursdays

## 2010-08-01 NOTE — Miscellaneous (Signed)
Summary: labs cbcd 12/19/2009  Clinical Lists Changes  Observations: Added new observation of ABSOLUTE BAS: 0.0 K/uL (12/19/2009 9:31) Added new observation of BASOPHIL %: 0 % (12/19/2009 9:31) Added new observation of EOS ABSLT: 0.3 K/uL (12/19/2009 9:31) Added new observation of % EOS AUTO: 3 % (12/19/2009 9:31) Added new observation of ABSOLUTE MON: 0.9 K/uL (12/19/2009 9:31) Added new observation of MONOCYTE %: 11 % (12/19/2009 9:31) Added new observation of ABS LYMPHOCY: 2.3 K/uL (12/19/2009 9:31) Added new observation of LYMPHS %: 28 % (12/19/2009 9:31) Added new observation of PLATELETK/UL: 201 K/uL (12/19/2009 9:31) Added new observation of RDW: 14.8 % (12/19/2009 9:31) Added new observation of MCHC RBC: 33.4 g/dL (29/56/2130 8:65) Added new observation of MCV: 96.5 fL (12/19/2009 9:31) Added new observation of HCT: 44.6 % (12/19/2009 9:31) Added new observation of HGB: 14.9 g/dL (78/46/9629 5:28) Added new observation of RBC M/UL: 4.62 M/uL (12/19/2009 9:31) Added new observation of WBC COUNT: 8.1 10*3/microliter (12/19/2009 9:31)

## 2010-08-01 NOTE — Assessment & Plan Note (Signed)
Summary: F3M   Visit Type:  Follow-up Primary Provider:  Dr.Scott Luking   History of Present Illness: [name] returns to the office for continuing assessment of atrial fibrillation.  Since his last visit, he has been asymptomatic.  He is relatively sedentary, but walks in large local stores without any cardiopulmonary symptoms. He experiences no palpitations, nor has he had edema, orthopnea, PND or respiratory difficulties.  EKG  Procedure date:  12/19/2009  Findings:      Rhythm Strip  Atrial fibrillation Borderline ventricular response with a heart rate of 60 bpm   Current Medications (verified): 1)  Caduet 5-20 Mg Tabs (Amlodipine-Atorvastatin) .... Take 1 Tablet By Mouth Once A Day 2)  Furosemide 40 Mg Tabs (Furosemide) .... Take 1 Tablet By Mouth Once A Day 3)  Lisinopril 40 Mg Tabs (Lisinopril) .... Take 1 Tablet By Mouth Once A Day 4)  Metoprolol Tartrate 100 Mg Tabs (Metoprolol Tartrate) .... Take 1 1/2 Tabs Two Times A Day 5)  Aspir-Low 81 Mg Tbec (Aspirin) .... Take 1 Tab Daily 6)  Meclizine Hcl 25 Mg Tabs (Meclizine Hcl) .... Take As Needed 7)  Warfarin Sodium 5 Mg Tabs (Warfarin Sodium) .... Take 1 Tablet As Directed By Coumadin Clinic  Allergies (verified): 1)  ! Ibuprofen  Past History:  PMH, FH, and Social History reviewed and updated.  Review of Systems       See history of present illness.  Vital Signs:  Patient profile:   77 year old male Weight:      278 pounds Pulse rate:   61 / minute BP sitting:   136 / 89  (right arm)  Vitals Entered By: Dreama Saa, CNA (December 19, 2009 1:18 PM)  Physical Exam  General:  Overweight; well developed; no acute distress:   Neck-No JVD; no carotid bruits: Lungs-No tachypnea, no rales; no rhonchi; no wheezes: Cardiovascular-irregular rhythm; normal PMI; normal S1 and S2: Abdomen-BS normal; soft and non-tender without masses or organomegaly:  Neurologic-Normal cranial nerves; symmetric strength and  tone:  Skin-Warm, no significant lesions: Extremities: distal pulses: 1-2+; trace edema:     Impression & Recommendations:  Problem # 1:  ATRIAL FIBRILLATION (ICD-427.31) Heart rate is on the slow side at rest, but overall appears adequately controlled.  Anticoagulation has been stable and therapeutic since initial mild excessive anticoagulation.  We will continue to monitor her INRs and will obtain a CBC and stool for Hemoccult testing as well.  Patient's most recent echocardiogram was in 2006 and was technically suboptimal.  A repeat study will be performed to reassess left ventricular function and to rule out significant valvular abnormalities.  Problem # 2:  ATHEROSCLEROTIC CARDIOVASCULAR DISEASE-CABG (ICD-429.2) No symptoms to suggest recurrent myocardial ischemia.  LV systolic function has been borderline to mildly depressed when assessed in the past, but there has been no clinical congestive heart failure.  Current medications are appropriate and will be continued.  Problem # 3:  HYPERTENSION (ICD-401.9) Blood pressure control is good.  I plan see this nice gentleman again in 7 months.  Other Orders: 2-D Echocardiogram (2D Echo) Hemoccult Cards (Take Home) (Hemoccult Cards) T-CBC w/Diff [phone])  Patient Instructions: 1)  Your physician recommends that you schedule a follow-up appointment in: 7 MONTHS 2)  Your physician recommends that you return for lab work [id number] 3)  Your physician has requested that you have an echocardiogram.  Echocardiography is a painless test that uses sound waves to create images of your heart. It provides your doctor  with information about the size and shape of your heart and how well your heart's chambers and valves are working.  This procedure takes approximately one hour. There are no restrictions for this procedure. 4)  Your physician has asked that you test your stool for blood. It is necessary to test 3 different stool specimens for  accuracy. You will be given 3 hemoccult cards for specimen collection. For each stool specimen, place a small portion of stool sample (from 2 different areas of the stool) into the 2 squares on the card. Close card. Repeat with 2 more stool specimens. Bring the cards back to the office for testing.

## 2010-08-01 NOTE — Assessment & Plan Note (Signed)
Summary: 7 mth f/u per checkout on 12/19/09/tg   Visit Type:  Follow-up Primary Provider:  Dr.Scott Luking   History of Present Illness: [name] returns to the office for continued assessment and treatment of atrial fibrillation.  Since his last visit, he has done quite well.  He denies dyspnea or chest discomfort.  He has not noted significant pedal edema, occasionally experiences brief episodes of dizziness without falls or loss of consciousness.  EKG  Procedure date:  07/10/2010  Findings:      Rhythm Strip  Atrial fibrillation with a slow ventricular response Heart rate is 50 bpm   Current Medications (verified): 1)  Caduet 10-20 Mg Tabs (Amlodipine-Atorvastatin) .... Take 1 Tablet By Mouth Once A Day 2)  Furosemide 40 Mg Tabs (Furosemide) .... Take 1 Tablet By Mouth Once A Day 3)  Lisinopril 40 Mg Tabs (Lisinopril) .... Take 1 Tablet By Mouth Once A Day 4)  Metoprolol Tartrate 100 Mg Tabs (Metoprolol Tartrate) .... Take 1 Tablet By Mouth Two Times A Day 5)  Meclizine Hcl 25 Mg Tabs (Meclizine Hcl) .... Take As Needed 6)  Warfarin Sodium 5 Mg Tabs (Warfarin Sodium) .... Take 1 Tablet As Directed By Coumadin Clinic  Allergies (verified): 1)  ! Ibuprofen  Comments:  Nurse/Medical Assistant: patient brought med list reviewed with [facility] [place] [facility] patients pharmacy  Past History:  PMH, FH, and Social History reviewed and updated.  Review of Systems       See history of present illness.  Vital Signs:  Patient profile:   77 year old male Weight:      275 pounds BMI:     37.43 O2 Sat:      94 % on Room air Pulse rate:   50 / minute BP sitting:   171 / 90  (left arm)  Vitals Entered By: Jimmy Saa, CNA (July 10, 2010 11:19 AM)  O2 Flow:  Room air  Physical Exam  General:  Overweight; well developed; no acute distress:   Neck-No JVD; no carotid bruits: Lungs-No tachypnea, no rales; no rhonchi; no  wheezes: Cardiovascular-irregular rhythm; normal PMI; normal S1 and S2: Abdomen-BS normal; soft and non-tender without masses or organomegaly:  Neurologic-Normal cranial nerves; symmetric strength and tone:  Skin-Warm, no significant lesions: Extremities: distal pulses: 1+ edema:     Impression & Recommendations:  Problem # 1:  HYPERTENSION (ICD-401.9) Blood pressure control remains suboptimal.  Repeat assessment with a manual cuff showed a value of 145/75.  Automatic cuffs may not provide accurate results in individuals with atrial fibrillation.  His dose of amlodipine will be increased to 10 mg q.d.  He will collect additional blood pressures at home and return in 2 months for a check by the cardiology nurses.  Problem # 2:  ATRIAL FIBRILLATION (ICD-427.31) Heart rate is on the slow side, which may be the cause of [name] intermittent dizziness.  Metoprolol will be decreased to 100 mg b.i.d.  Unfortunately, this may further impair control of hypertension.  Problem # 3:  ANTICOAGULATION (ICD-V58.61) He has done well with Coumadin therapy.  Since he has no known vascular disease, aspirin can be discontinued.  Stool testing was performed, but the results have been lost.  We will once again ask him to provide samples for assessment by Hemoccult.  A metabolic profile, lipid profile and TSH are pending.  I will plan to reassess this nice gentleman in 9 months.  Other Orders: Future Orders: T-Comprehensive Metabolic Panel ([id number]) .Marland KitchenMarland Kitchen  07/15/2010 T-Lipid Profile [phone]) ... 07/15/2010 T-TSH [phone]) ... 07/15/2010  Patient Instructions: 1)  Your physician recommends that you schedule a follow-up appointment in: 9 MONTHS 2)  Your physician recommends that you return for lab work in: 07/15/10 3)  Your physician has recommended you make the following change in your medication: STOP ASPIRIN, DECREASE METORPOLOL TO 1 TABLET TWICE DAILY, INCREASE CADUET TO 10/20  WITH NEXT  REFILL 4)  You have been referred to NURSE VISIT IN 2 MONTHS , PLEASE BRING BP DIARY TO NURSE VISIT 5)  Your physician has requested that you regularly monitor and record your blood pressure readings at home.  Please use the same machine at the same time of day to check your readings and record them to bring to your follow-up visit. 6)  Your physician has asked that you test your stool for blood. It is necessary to test 3 different stool specimens for accuracy. You will be given 3 hemoccult cards for specimen collection. For each stool specimen, place a small portion of stool sample (from 2 different areas of the stool) into the 2 squares on the card. Close card. Repeat with 2 more stool specimens. Bring the cards back to the office for testing. Prescriptions: CADUET 10-20 MG TABS (AMLODIPINE-ATORVASTATIN) Take 1 tablet by mouth once a day  #30 x 3   Entered by:   Teressa Lower RN   Authorized by:   Kathlen Brunswick, MD, [facility]   Signed by:   Teressa Lower RN on 07/10/2010   Method used:   Electronically to        [facility]* (retail)       [address]       [place], [place]  [address]       Ph: [phone]       Fax: [phone]   RxID:   [id number]

## 2010-08-01 NOTE — Assessment & Plan Note (Signed)
Summary: 1 MTH F/U PER CHECKUOT ON 08/07/09/TG   Visit Type:  Follow-up Primary Provider:  DR.Scott Luking  CC:  no cardiology complaints today.  History of Present Illness: [name] is a 16 obese CM who we are seeing on follow-up for history of hyptertension and Atril fib.  He has a history of ischemic cardiomyopathy, and hyperlipidemia. He is not on coumadin, however had been in the past, but stopped taking it for unclear reasons.  He had not been taking his lasix as described either.  I have reviewed his medications with him and looked at his bottles.  The lasix bottle is empty, but he states he took the last one today. His weight it essentially the same.  He denies symptoms. His wife is with him and she helps to remind him of his history.  His CHADs score is 3.   Current Medications (verified): 1)  Caduet 5-20 Mg Tabs (Amlodipine-Atorvastatin) .... Take 1 Tablet By Mouth Once A Day 2)  Furosemide 40 Mg Tabs (Furosemide) .... Take 1 Tablet By Mouth Once A Day 3)  Lisinopril 40 Mg Tabs (Lisinopril) .... Take 1 Tablet By Mouth Once A Day 4)  Metoprolol Tartrate 100 Mg Tabs (Metoprolol Tartrate) .... Take 1 1/2 Tabs Two Times A Day 5)  Aspir-Low 81 Mg Tbec (Aspirin) .... Take 1 Tab Daily 6)  Meclizine Hcl 25 Mg Tabs (Meclizine Hcl) .... Take As Needed 7)  Warfarin Sodium 5 Mg Tabs (Warfarin Sodium) .... Take 1 Tablet As Directed By Coumadin Clinic  Allergies (verified): No Known Drug Allergies  Review of Systems       All other systems have been reviewed and are negative unless stated above.   Vital Signs:  Patient profile:   77 year old male Weight:      279 pounds Pulse rate:   60 / minute BP sitting:   151 / 79  (right arm)  Vitals Entered By: Dreama Saa, CNA (September 13, 2009 2:44 PM)  Physical Exam  General:  Well developed, well nourished, in no acute distress. Lungs:  Clear bilaterally to auscultation and percussion. Heart:  Distant, irregular Abdomen:  obese  2+  BS Msk:  Back normal, normal gait. Muscle strength and tone normal. Extremities:  1+ left pedal edema and 1+ right pedal edema.   Neurologic:  Alert and oriented x 3. Psych:  Normal affect.   Impression & Recommendations:  Problem # 1:  ATRIAL FIBRILLATION-POSTOPERATIVE (ICD-427.31) His rate is controlled.  I have discussed the issue of restarting the coumadin therapy.  Risks and benefits.  He and his wife have decided to resume therapy.  She is seen by the coumadin clinic today and will follow up.  His updated medication list for this problem includes:    Metoprolol Tartrate 100 Mg Tabs (Metoprolol tartrate) .[name]... Take 1 1/2 tabs two times a day    Aspir-low 81 Mg Tbec (Aspirin) .[name]... Take 1 tab daily    Warfarin Sodium 5 Mg Tabs (Warfarin sodium) .[name]... Take 1 tablet as directed by coumadin clinic  Problem # 2:  HYPERTENSION (ICD-401.9) Review of his labs and renal function have been done.  His BP is mildly elevated at this time.  I have not made any changes at this time. His updated medication list for this problem includes:    Caduet 5-20 Mg Tabs (Amlodipine-atorvastatin) .[name]... Take 1 tablet by mouth once a day    Furosemide 40 Mg Tabs (Furosemide) .[name]... Take 1 tablet by mouth once  a day    Lisinopril 40 Mg Tabs (Lisinopril) .[name]... Take 1 tablet by mouth once a day    Metoprolol Tartrate 100 Mg Tabs (Metoprolol tartrate) .[name]... Take 1 1/2 tabs two times a day    Aspir-low 81 Mg Tbec (Aspirin) .[name]... Take 1 tab daily  Patient Instructions: 1)  Your physician recommends that you schedule a follow-up appointment in: 3 months 2)  Your physician has recommended you make the following change in your medication: resume Warfarin 5mg  as directed by coumadin clinic. Prescriptions: WARFARIN SODIUM 5 MG TABS (WARFARIN SODIUM) take 1 tablet as directed by coumadin clinic  #60 x 6   Entered by:   [name]   Authorized by:   Jimmy Reining, [name]   Signed by:   [name] on 16/06/930    Method used:   Electronically to        [facility]* (retail)       [address]       [place], [place]  [address]       Ph: [phone]       Fax: [phone]   RxID:   [id number] METOPROLOL TARTRATE 100 MG TABS (METOPROLOL TARTRATE) Take 1 1/2 tabs two times a day  #45 x 6   Entered by:   [name]   Authorized by:   Jimmy Reining, [name]   Signed by:   [name] on 54/62/7035   Method used:   Electronically to        [facility]* (retail)       [address]       [place], [place]  [address]       Ph: [phone]       Fax: [phone]   RxID:   [id number] LISINOPRIL 40 MG TABS (LISINOPRIL) Take 1 tablet by mouth once a day  #30 x 6   Entered by:   [name]   Authorized by:   Jimmy Reining, [name]   Signed by:   [name] on 36/14/4315   Method used:   Electronically to        [facility]* (retail)       [address]       [place], [place]  [address]       Ph: [phone]       Fax: [phone]   RxID:   [id number] FUROSEMIDE 40 MG TABS (FUROSEMIDE) Take 1 tablet by mouth once a day  #30 x 6   Entered by:   [name]   Authorized by:   Jimmy Reining, [name]   Signed by:   [name] on 37/90/2409   Method used:   Electronically to        [facility]* (retail)       [address]       [place], [place]  [address]       Ph: [phone]       Fax: [phone]   RxID:   [id number] CADUET 5-20 MG TABS (AMLODIPINE-ATORVASTATIN) Take 1 tablet by mouth once a day  #30 x 6   Entered by:   [name]   Authorized by:   Jimmy Reining, [name]   Signed by:  [name] on 16/03/9603   Method used:   Electronically to        [facility]* (retail)       [address]       [place], [place]  [address]       Ph: [phone]       Fax: [phone]   RxID:   [id number]

## 2010-08-01 NOTE — Medication Information (Signed)
Summary: CVCR  Anticoagulant Therapy  Managed by: Teressa Lower, RN PCP: DR.Scott Gerda Diss Supervising MD: Dietrich Pates MD, Molly Maduro Indication 1: Atrial Fibrillation INR POC 1.1  Dietary changes: no    Health status changes: no    Bleeding/hemorrhagic complications: no    Recent/future hospitalizations: no    Any changes in medication regimen? no    Recent/future dental: no  Any missed doses?: no       Is patient compliant with meds? yes       Current Medications (verified): 1)  Caduet 5-20 Mg Tabs (Amlodipine-Atorvastatin) .... Take 1 Tablet By Mouth Once A Day 2)  Furosemide 40 Mg Tabs (Furosemide) .... Take 1 Tablet By Mouth Once A Day 3)  Lisinopril 40 Mg Tabs (Lisinopril) .... Take 1 Tablet By Mouth Once A Day 4)  Metoprolol Tartrate 100 Mg Tabs (Metoprolol Tartrate) .... Take 1 1/2 Tabs Two Times A Day 5)  Aspir-Low 81 Mg Tbec (Aspirin) .... Take 1 Tab Daily 6)  Meclizine Hcl 25 Mg Tabs (Meclizine Hcl) .... Take As Needed 7)  Warfarin Sodium 5 Mg Tabs (Warfarin Sodium) .... Take 1 Tablet As Directed By Coumadin Clinic  Allergies (verified): No Known Drug Allergies  Anticoagulation Management History:      The patient comes in today for his initial visit for anticoagulation therapy.  Positive risk factors for bleeding include an age of 11 years or older.  The bleeding index is 'intermediate risk'.  Positive CHADS2 values include History of HTN and Age > 65 years old.  Anticoagulation responsible provider: Dietrich Pates MD, Molly Maduro.  INR POC: 1.1.  Cuvette Lot#: [id number].  Exp: 10/2010.    Anticoagulation Management Assessment/Plan:      The patient's current anticoagulation dose is Warfarin sodium 5 mg tabs: take 1 tablet as directed by coumadin clinic.  The target INR is 2.0-3.0.  The next INR is due 09/24/2009.  Results were reviewed/authorized by Teressa Lower, RN.  He was notified by Teressa Lower RN.         Current Anticoagulation Instructions: INR 1.1 TODAY BEGIN COUMADIN  AT 1 TABLET DAILY RECHECK IN 10 DAYS

## 2010-08-01 NOTE — Medication Information (Signed)
Summary: ccr-lr  Anticoagulant Therapy  Managed by: Vashti Hey, RN PCP: Dr.Scott Gerda Diss Supervising MD: Dietrich Pates MD, Molly Maduro Indication 1: Atrial Fibrillation Lab Used: [facility] [place] Site: [place] INR POC 3.3  Dietary changes: no    Health status changes: no    Bleeding/hemorrhagic complications: no    Recent/future hospitalizations: no    Any changes in medication regimen? no    Recent/future dental: no  Any missed doses?: no       Is patient compliant with meds? yes       Allergies: 1)  ! Ibuprofen  Anticoagulation Management History:      The patient is taking warfarin and comes in today for a routine follow up visit.  Warfarin therapy is being given due to chronic atrial fibrillation.  Positive risk factors for bleeding include an age of 75 years or older.  Negative risk factors for bleeding include no history of CVA/TIA, no history of GI bleeding, and absence of serious comorbidities.  The bleeding index is 'intermediate risk'.  Positive CHADS2 values include History of HTN and Age > 85 years old.  Negative CHADS2 values include History of CHF, History of Diabetes, and Prior Stroke/CVA/TIA.  The start date was 09/13/2009.  Anticoagulation responsible provider: Dietrich Pates MD, Molly Maduro.  INR POC: 3.3.  Exp: 10/2010.    Anticoagulation Management Assessment/Plan:      The patient's current anticoagulation dose is Warfarin sodium 5 mg tabs: take 1 tablet as directed by coumadin clinic.  The target INR is 2.0-3.0.  The next INR is due 07/10/2010.  Anticoagulation instructions were given to patient.  Results were reviewed/authorized by Vashti Hey, RN.  He was notified by Vashti Hey RN.         Prior Anticoagulation Instructions: INR 1.6 Take couamdin 1 1/2 tablets tonight then increase dose to 5mg  once daily   Current Anticoagulation Instructions: INR 3.3 Decrease coumadin to 5mg  once daily except 2.5mg  on Wednesdays

## 2010-08-01 NOTE — Miscellaneous (Signed)
Summary: cmp,lipids,03/06/2009  Clinical Lists Changes  Observations: Added new observation of CALCIUM: 8.8 mg/dL (91/47/8295 62:13) Added new observation of ALBUMIN: 4.0 g/dL (08/65/7846 96:29) Added new observation of PROTEIN, TOT: 7.1 g/dL (52/84/1324 40:10) Added new observation of SGPT (ALT): 15 units/L (03/06/2009 16:36) Added new observation of SGOT (AST): 16 units/L (03/06/2009 16:36) Added new observation of ALK PHOS: 80 units/L (03/06/2009 16:36) Added new observation of CREATININE: 1.04 mg/dL (27/25/3664 40:34) Added new observation of BUN: 18 mg/dL (74/25/9563 87:56) Added new observation of BG RANDOM: 115 mg/dL (43/32/9518 84:16) Added new observation of CO2 PLSM/SER: 25 meq/L (03/06/2009 16:36) Added new observation of CL SERUM: 105 meq/L (03/06/2009 16:36) Added new observation of K SERUM: 4.9 meq/L (03/06/2009 16:36) Added new observation of NA: 140 meq/L (03/06/2009 16:36) Added new observation of LDL: 70 mg/dL (60/63/0160 10:93) Added new observation of HDL: 48 mg/dL (23/55/7322 02:54) Added new observation of TRIGLYC TOT: 81 mg/dL (27/11/2374 28:31) Added new observation of CHOLESTEROL: 134 mg/dL (51/76/1607 37:10)

## 2010-08-07 ENCOUNTER — Encounter: Payer: Self-pay | Admitting: Cardiology

## 2010-08-07 ENCOUNTER — Encounter (INDEPENDENT_AMBULATORY_CARE_PROVIDER_SITE_OTHER): Payer: Medicare Other

## 2010-08-07 ENCOUNTER — Encounter (INDEPENDENT_AMBULATORY_CARE_PROVIDER_SITE_OTHER): Payer: Self-pay | Admitting: *Deleted

## 2010-08-07 ENCOUNTER — Ambulatory Visit (INDEPENDENT_AMBULATORY_CARE_PROVIDER_SITE_OTHER): Payer: Medicare Other

## 2010-08-07 DIAGNOSIS — R195 Other fecal abnormalities: Secondary | ICD-10-CM

## 2010-08-07 DIAGNOSIS — Z7901 Long term (current) use of anticoagulants: Secondary | ICD-10-CM

## 2010-08-07 DIAGNOSIS — I4891 Unspecified atrial fibrillation: Secondary | ICD-10-CM

## 2010-08-07 LAB — CONVERTED CEMR LAB: OCCULT 3: NEGATIVE

## 2010-08-09 ENCOUNTER — Encounter (INDEPENDENT_AMBULATORY_CARE_PROVIDER_SITE_OTHER): Payer: Self-pay | Admitting: *Deleted

## 2010-08-15 NOTE — Medication Information (Signed)
Summary: ccr-lr [place]  Anticoagulant Therapy Managed by: Vashti Hey, RN Patient Assessment Part 2:  Have you MISSED ANY DOSES or CHANGED TABLETS?  0  Have you had any BRUISING or BLEEDING ( nose or gum bleeds,blood in urine or stool)?  Have you STARTED or STOPPED any MEDICATIONS, including OTC meds,herbals or supplements?  Have you CHANGED your DIET, especially green vegetables,or ALCOHOL intake?  Have you had any ILLNESSES or HOSPITALIZATIONS?  Have you had any signs of CLOTTING?(chest discomfort,dizziness,shortness of breath,arms tingling,slurred speech,swelling or redness in leg)       Regimen Out:    Total Weekly: 27.50 mg mg  Next INR Due: 09/04/2010      Allergies: 1)  ! Ibuprofen   Anticoagulation Management History:      The patient is taking warfarin and comes in today for a routine follow up visit.  Anticoagulation is being administered due to chronic atrial fibrillation.  Positive risk factors for bleeding include an age of 75 years or older.  Negative risk factors for bleeding include no history of CVA/TIA, no history of GI bleeding, and absence of serious comorbidities.  The bleeding index is 'intermediate risk'.  Positive CHADS2 values include History of HTN and Age > 15 years old.  Negative CHADS2 values include History of CHF, History of Diabetes, and Prior Stroke/CVA/TIA.  The start date was 09/13/2009.  Anticoagulation responsible provider: Dietrich Pates MD, Molly Maduro.  INR POC: 2.8.  Cuvette Lot#: [id number].  Exp: 10/2010.    Anticoagulation Management Assessment/Plan:      The patient's current anticoagulation dose is Warfarin sodium 5 mg tabs: take 1 tablet as directed by coumadin clinic.  The target INR is 2.0-3.0.  The next INR is due 09/04/2010.  Anticoagulation instructions were given to patient.  Results were reviewed/authorized by Vashti Hey, RN.  He was notified by Vashti Hey RN.         Prior Anticoagulation Instructions: INR 3.9 Hold coumadin tonight then  decrease dose to 5mg  once daily except 2.5mg  on Mondays, Wednesdays and Fridays  Current Anticoagulation Instructions: INR 2.8 Continue coumadin 5mg  once daily except 2.5mg  on Mondays, Wednesdays and Fridays

## 2010-08-15 NOTE — Miscellaneous (Signed)
Summary: hemmocult cards 08/07/2010  Clinical Lists Changes  Observations: Added new observation of HEMOCCULT 3: neg (08/07/2010 8:39) Added new observation of HEMOCCULT 2: neg (08/07/2010 8:39) Added new observation of HEMOCCULT 1: neg (08/07/2010 8:39)

## 2010-09-04 ENCOUNTER — Encounter: Payer: Self-pay | Admitting: Cardiovascular Disease

## 2010-09-04 ENCOUNTER — Encounter: Payer: Self-pay | Admitting: Cardiology

## 2010-09-04 ENCOUNTER — Encounter (INDEPENDENT_AMBULATORY_CARE_PROVIDER_SITE_OTHER): Payer: Self-pay | Admitting: *Deleted

## 2010-09-04 ENCOUNTER — Encounter (INDEPENDENT_AMBULATORY_CARE_PROVIDER_SITE_OTHER): Payer: Medicare Other

## 2010-09-04 ENCOUNTER — Ambulatory Visit (INDEPENDENT_AMBULATORY_CARE_PROVIDER_SITE_OTHER): Payer: Medicare Other

## 2010-09-04 DIAGNOSIS — R0989 Other specified symptoms and signs involving the circulatory and respiratory systems: Secondary | ICD-10-CM

## 2010-09-04 DIAGNOSIS — I4891 Unspecified atrial fibrillation: Secondary | ICD-10-CM

## 2010-09-04 DIAGNOSIS — Z7901 Long term (current) use of anticoagulants: Secondary | ICD-10-CM

## 2010-09-04 LAB — CONVERTED CEMR LAB: POC INR: 2.6

## 2010-09-10 NOTE — Assessment & Plan Note (Signed)
Summary: F2M F/U PER CHECK OUT ON 1/11/TG/JML   Visit Type:  2 month nurse visit Primary Provider:  Dr.Scott Luking   History of Present Illness: S: 2 month nurse visit B: office visit on 07/10/10, labs on 07/15/10 stopped asa, decreased metoprolol to 1 tablet two times a day, increased caduet to 10/20 daily A: denies c/o, labs wnl, bp diary returned and scanned into EMR R:  Current Medications (verified): 1)  Caduet 10-20 Mg Tabs (Amlodipine-Atorvastatin) .... Take 1 Tablet By Mouth Once A Day 2)  Furosemide 40 Mg Tabs (Furosemide) .... Take 1 Tablet By Mouth Once A Day 3)  Lisinopril 40 Mg Tabs (Lisinopril) .... Take 1 Tablet By Mouth Once A Day 4)  Metoprolol Tartrate 100 Mg Tabs (Metoprolol Tartrate) .... Take 1 Tablet By Mouth Two Times A Day 5)  Meclizine Hcl 25 Mg Tabs (Meclizine Hcl) .... Take As Needed 6)  Warfarin Sodium 5 Mg Tabs (Warfarin Sodium) .... Take 1 Tablet As Directed By Coumadin Clinic  Allergies (verified): 1)  ! Ibuprofen   Vital Signs:  Patient profile:   77 year old male Height:      72 inches Weight:      276 pounds BMI:     37.57 O2 Sat:      96 % on Room air Pulse rate:   53 / minute BP sitting:   143 / 81  (left arm)  Vitals Entered By: Teressa Lower RN (September 04, 2010 9:55 AM)  O2 Flow:  Room air  Appended Document: F2M F/U PER CHECK OUT ON 1/11/TG/JML Spoke with pt, no change in medical regimen

## 2010-09-10 NOTE — Letter (Signed)
Summary: BP LOG  BP LOG   Imported By: Faythe Ghee 09/04/2010 11:47:26  _____________________________________________________________________  External Attachment:    Type:   Image     Comment:   External Document

## 2010-09-10 NOTE — Medication Information (Signed)
Summary: ccr-lr  Anticoagulant Therapy  Managed by: Jimmy Hey, RN PCP: [name] Supervising Sanders: [name], Theron Arista Indication 1: Atrial Fibrillation Lab Used: [facility]  Site: [place] INR POC 2.6  Dietary changes: no    Health status changes: no    Bleeding/hemorrhagic complications: no    Recent/future hospitalizations: no    Any changes in medication regimen? no    Recent/future dental: no  Any missed doses?: no       Is patient compliant with meds? yes       Allergies: 1)  ! Ibuprofen  Anticoagulation Management History:      The patient is taking warfarin and comes in today for a routine follow up visit.  Anticoagulation is being administered due to chronic atrial fibrillation.  Positive risk factors for bleeding include an age of 75 years or older.  Negative risk factors for bleeding include no history of CVA/TIA, no history of GI bleeding, and absence of serious comorbidities.  The bleeding index is 'intermediate risk'.  Positive CHADS2 values include History of HTN and Age > 68 years old.  Negative CHADS2 values include History of CHF, History of Diabetes, and Prior Stroke/CVA/TIA.  The start date was 09/13/2009.  Anticoagulation responsible provider: Eden Emms Sanders, Theron Arista.  INR POC: 2.6.  Cuvette Lot#: [id number].  Exp: 10/2010.    Anticoagulation Management Assessment/Plan:      The patient's current anticoagulation dose is Warfarin sodium 5 mg tabs: take 1 tablet as directed by coumadin clinic.  The target INR is 2.0-3.0.  The next INR is due 10/02/2010.  Anticoagulation instructions were given to patient.  Results were reviewed/authorized by Jimmy Hey, RN.  He was notified by Jimmy Hey RN.         Prior Anticoagulation Instructions: INR 2.8 Continue coumadin 5mg  once daily except 2.5mg  on Mondays, Wednesdays and Fridays  Current Anticoagulation Instructions: INR 2.6 Continue coumadin 5mg  once daily except 2.5mg  on Mondays, Wednesdays and  Fridays

## 2010-10-01 ENCOUNTER — Encounter: Payer: Self-pay | Admitting: Cardiology

## 2010-10-01 DIAGNOSIS — I4821 Permanent atrial fibrillation: Secondary | ICD-10-CM | POA: Insufficient documentation

## 2010-10-01 DIAGNOSIS — Z7901 Long term (current) use of anticoagulants: Secondary | ICD-10-CM | POA: Insufficient documentation

## 2010-10-01 DIAGNOSIS — I4891 Unspecified atrial fibrillation: Secondary | ICD-10-CM

## 2010-10-02 ENCOUNTER — Ambulatory Visit (INDEPENDENT_AMBULATORY_CARE_PROVIDER_SITE_OTHER): Payer: Medicare Other | Admitting: *Deleted

## 2010-10-02 DIAGNOSIS — I4891 Unspecified atrial fibrillation: Secondary | ICD-10-CM

## 2010-10-02 DIAGNOSIS — Z7901 Long term (current) use of anticoagulants: Secondary | ICD-10-CM

## 2010-10-02 LAB — POCT INR: INR: 2.8

## 2010-10-09 ENCOUNTER — Other Ambulatory Visit: Payer: Self-pay | Admitting: Adult Health

## 2010-10-15 ENCOUNTER — Other Ambulatory Visit: Payer: Self-pay | Admitting: Cardiology

## 2010-10-15 ENCOUNTER — Other Ambulatory Visit: Payer: Self-pay

## 2010-10-15 MED ORDER — METOPROLOL TARTRATE 100 MG PO TABS: 100.0000 mg | ORAL_TABLET | Freq: Two times a day (BID) | ORAL | Status: DC

## 2010-10-30 ENCOUNTER — Encounter: Payer: Medicare Other | Admitting: *Deleted

## 2010-10-31 ENCOUNTER — Ambulatory Visit (INDEPENDENT_AMBULATORY_CARE_PROVIDER_SITE_OTHER): Payer: Medicare Other | Admitting: *Deleted

## 2010-10-31 DIAGNOSIS — Z7901 Long term (current) use of anticoagulants: Secondary | ICD-10-CM

## 2010-10-31 DIAGNOSIS — I4891 Unspecified atrial fibrillation: Secondary | ICD-10-CM

## 2010-10-31 LAB — POCT INR: INR: 3.5

## 2010-11-12 NOTE — Letter (Signed)
May 05, 2007    Scott A. Gerda Diss, MD  [address], [address]  [place], [place] [address]   RE:  BLYTHE, HARTSHORN  MRN:  [id number]  /  DOB:  15-Feb-1931   Dear Lorin Picket:   Mr. Tolliver returns to the office for continued assessment and treatment  of coronary disease and a history of paroxysmal atrial fibrillation, 5  months beyond his anticipated return visit.  He is not taking his  medication as we anticipated but has done well from a symptomatic  standpoint.  He was seen in the emergency department at [facility] on one occasion with vertigo.  He thought that this was due to  hypotension but there is no documentation of that, and he was treated  with Dramamine.   CURRENT MEDICATIONS:  1. Warfarin with stable and therapeutic anticoagulation.  2. Aspirin 81 mg daily.  3. Furosemide 40 mg daily.  4. Caduet 2.5/20 mg daily.  5. Lisinopril 20 mg daily.  6. Metoprolol, a total of 200 mg per day.   EXAM:  Overweight, pleasant gentleman.  The weight is 273, 13 pounds more than in February.  Blood pressure  160/80, heart rate 70 and regular, respirations 16.  NECK:  No jugular venous distention.  No carotid bruits.  LUNGS:  Decreased breath sounds at the bases.  Otherwise clear.  CARDIAC:  Normal first and second heart sounds.  Fourth heart sound  present.  ABDOMEN:  Soft and nontender.  No organomegaly.  EXTREMITIES:  Trace edema.   Most recent laboratory was from earlier this year, at which time lipid  profile was excellent.   IMPRESSION:  Mr. Dada is doing well from a symptomatic standpoint.  We  will once again try to convince him to reduce his Lasix to 20 mg daily.  Due to suboptimal control of hypertension, Caduet will be increased to  5/20 mg daily and lisinopril to 40 mg daily.  Since there has been no  apparent recurrence of atrial fibrillation off amiodarone, warfarin will  be discontinued.  He will carry an event recorder for one month to  verify that there is no  asymptomatic atrial fibrillation.  He will see  the cardiology nurse in 1 and 2 months to review his medications and be  sure that he is taking them correctly.  I will see him again in 6  months.  We will offer him influenza vaccine or ask him to check with  your office for administration.    Sincerely,      Gerrit Friends. Dietrich Pates, MD, [facility]  Electronically Signed    RMR/MedQ  DD: 05/05/2007  DT: 05/06/2007  Job #: [id number]

## 2010-11-12 NOTE — Letter (Signed)
February 28, 2008    Scott A. Gerda Diss, MD  [address]  [place], [place] [address]   RE:  NYAN, DUFRESNE  MRN:  [id number]  /  DOB:  [birth year]   Dear Lorin Picket,   Mr. Bomar returns to the office for continued assessment and treatment  of coronary disease, now nearly 3 years status post CABG surgery.  He  has mildly depressed left ventricular systolic function, but has not had  clinical congestive heart failure except postoperatively.  He has noted  no dyspnea, no chest pain, no palpitations, no lightheadedness, and no  syncope.  He is relatively inactive, but does some yard work.  He has  served on the [employer] for 12 years, but is about to leave that  post.   CURRENT MEDICATIONS:  1. Lisinopril 40 mg daily.  2. Caduet 5/20 mg daily.  3. Furosemide 20 mg daily.  4. Metoprolol 100 mg b.i.d.  5. Aspirin 81 mg daily.   PHYSICAL EXAMINATION:  GENERAL:  Overweight, pleasant gentleman.  VITAL SIGNS:  The weight is 278, 5 pounds more than last year.  Blood  pressure 115/75, heart rate 64 and regular, and respirations 14.  NECK:  No jugular venous distention; no carotid bruits.  LUNGS:  Few coarse breath sounds at the right base.  CARDIAC:  Normal first and second heart sounds; modest early systolic  ejection murmur.  ABDOMEN:  Obese; otherwise benign.  SKIN:  Minor flaking macular rash over the left side of the abdomen.  The patient is experiencing no symptoms referable to this.  EXTREMITIES:  Trace edema; distal pulses intact.   Chemistry profile in December was normal.  Lipid profile was excellent  with total cholesterol of 137, HDL of 47, and LDL of 72.   Health maintenance activities are up-to-date.  An unremarkable  colonoscopy was performed last year.  Pneumococcal vaccine and influenza  vaccine have been received per guidelines.   IMPRESSION:  Mr. Nevel is doing beautifully except with regards to his  weight.  He is advised to increase his level of activity and to  decrease  his caloric intake.  He understands the need for these measures.  Medical therapy is optimal and will not be modified.  I will plan see  this nice gentleman again in 1 year at which time a chemistry profile  and lipid profile will be obtained.    Sincerely,      Gerrit Friends. Dietrich Pates, MD, [facility]  Electronically Signed    RMR/MedQ  DD: 02/28/2008  DT: 02/29/2008  Job #: [id number]

## 2010-11-15 NOTE — Consult Note (Signed)
NAME:  [name], [name] NO.:  [id number]   MEDICAL RECORD NO.:  [id number]           PATIENT TYPE:   LOCATION:                                 FACILITY:   PHYSICIAN:  Lionel December, M.D.    DATE OF BIRTH:  [birth year]   DATE OF CONSULTATION:  01/01/2006  DATE OF DISCHARGE:                                   CONSULTATION   REASON FOR CONSULTATION:  Referred for colonoscopy.   HISTORY OF PRESENT ILLNESS:  [name] is a 77 year old Caucasian male who is  referred through the courtesy of Dr. Lilyan Punt for screening colonoscopy.  He has never had one.  He recently had physical exam by him and was advised  of this procedure.  According to Dr. Fletcher Anon note, he has had occasional  constipation.  The patient presently denies any GI symptoms.  He states his  bowels move regularly.  He denies abdominal pain, melena or rectal bleeding,  nausea or vomiting.  He has a good appetite.  He is trying to lose weight.  During his illness with cardiac disease surgery, etc., he has lost 40 pounds  but this now has leveled off.  He has heartburn infrequently with certain  foods.  He denies nausea, vomiting or dysphagia.   He is on:  1.  Metoprolol 100 mg b.i.d.  2.  Altace 5 mg b.i.d.  3.  Coumadin 2.5 mg five days a week and 2 mg two days a week.  4.  Caduet 2.5 mg daily.  5.  Amiodarone 200 mg daily.  6.  Lasix 40 mg q.a.m.  7.  Aspirin 81 mg daily.   PAST MEDICAL HISTORY:  1.  He has been hypertensive for several years.  2.  History of kidney stones.  He has passed a few and he had one removed      via left nephrostomy about 5 years ago.  3.  CAD.  He had four-vessel CABG in October 2006.  4.  He also has history of atrial fibrillation.  Cardioversion did not work.      He is now being treated medically.  5.  History of CHF which is also well controlled with therapy.  6.  Hyperlipidemia.   Other surgeries include tonsillectomy as a child and he had right knee  arthroscopy 20  years ago.   ALLERGIES:  To IBUPROFEN which causes rash.   FAMILY HISTORY:  Mother had lupus and died at 61.  Father died of MI at age  22.  One brother died of lung carcinoma at age 34 in 67.  Extended family  history is negative for colorectal carcinoma.   SOCIAL HISTORY:  He is married but does not have any children.  He a retired  Patent attorney.  He has never smoked cigarettes or drank alcohol.   PHYSICAL EXAMINATION:  GENERAL:  Pleasant, mildly-obese Caucasian male who  is in no acute distress.  He weighs 250 pounds.  He is 6 feet tall.  His BMI  is 33.9%.  Pulse 56 per minute and appears to be regular, blood  pressure  106/60, temperature is 97.9.  HEENT:  Conjunctivae are pink.  Sclerae are nonicteric.  Oropharyngeal  mucosa is normal.  He has upper dental plate but own teeth in lower jaw.  NECK:  No neck masses noted.  Carotids are 2+ bilaterally and without  bruits.  CARDIAC:  With regular rhythm.  Normal S1 and S2.  No murmur or gallop  noted.  LUNGS:  Clear to auscultation.  ABDOMEN:  Protuberant but soft and nontender without organomegaly or masses.  RECTAL:  Examination deferred.  EXTREMITIES:  He does not have clubbing or peripheral edema.   ASSESSMENT:  [name] is a 77 year old Caucasian male with average risk for  colorectal carcinoma.  He has never been screened before.  He does not have  any gastrointestinal symptoms other than occasional constipation.   He appears to be stable from cardiac standpoint.  He will have to be off his  Coumadin for 4 days but we will check with Dr. Marchelle Folks office to make sure  that he does not need to be bridged with Lovenox, etc.   RECOMMENDATIONS:  Colonoscopy to be performed at [facility] in the near future.  He  will continue his aspirin but we will stop his Coumadin after conferring  with Dr. Dorethea Clan to make sure he does not need to be bridged with Lovenox.   I have reviewed the procedure risks with the patient and he is  agreeable.   We would like to thank Dr. Lilyan Punt for the opportunity to participate  in the care of this gentleman.      Lionel December, M.D.  Electronically Signed     NR/MEDQ  D:  01/01/2006  T:  01/01/2006  Job:  [id number]   cc:   Lorin Picket A. Gerda Diss, MD  Fax: [phone]   Vida Roller, M.D.  Fax: [phone]

## 2010-11-15 NOTE — Letter (Signed)
August 11, 2006    Scott A. Gerda Diss, MD  [address]  [place] [address]   RE:  JAHMIER, WILLADSEN  MRN:  [id number]  /  DOB:  1930/11/24   Dear Lorin Picket:   Mr. Rosol returns to the office for continued assessment and treatment  of coronary disease, paroxysmal atrial fibrillation and multiple  cardiovascular risk factors.  He was previously followed by Dr. Dorethea Clan,  but has now entered my practice.  From a cardiovascular standpoint, he  has been doing exceedingly well.  He reports no symptoms.  The left  ventricular systolic function was initially moderately impaired, but  recovered significantly postoperatively.  He has an excellent lipid  profile and hepatic profile earlier this year. He reports no  palpitations nor adverse effects related to amiodarone.   His other medications include:  1. Warfarin with stable and therapeutic anticoagulation.  2. Aspirin 81 mg daily.  3. Ramipril 5 mg b.i.d.  4. Metoprolol 100 mg b.i.d..  5. Furosemide 40 mg daily.  6. Caduet 2.5 mg/20 mg daily.  7. Amiodarone 200 mg daily.   On exam, a pleasant, overweight gentleman in no acute distress.  The weight is 260 pounds, 10 pounds more than in June/2007.  Heart rate  70 and regular  Blood pressure 145/80.  Respirations 16.  NECK:  No jugular venous distention; normal carotid upstrokes without  bruits.  LUNGS:  Clear.  CARDIAC:  Distant 1st and 2nd heart sounds.  ABDOMEN:  Soft and nontender; no organomegaly; no masses.  EXTREMITIES:  No edema; distal pulses intact.   IMPRESSION:  Mr. Pavich is doing generally well.  His medical regimen  could do with some simplification.  We will change twice a day ACE-  inhibitor to Lisinopril 20 mg daily.  His furosemide dose will be  reduced to 20 mg daily.  His amiodarone has no known benefit for  survival or symptoms and will be discontinued.  We will obtain a CBC and  stool for Hemoccult testing due to chronic anticoagulation and  monitoring  studies for amiodarone.  I will reassess this nice gentleman  in 4 months.   He also asked me about excessive stress.  We discussed some coping  strategies and possible referral to behavioral health, which he declines  at present.    Sincerely,      Gerrit Friends. Dietrich Pates, MD, [facility]  Electronically Signed    RMR/MedQ  DD: 08/11/2006  DT: 08/11/2006  Job #: [id number]

## 2010-11-15 NOTE — Cardiovascular Report (Signed)
NAME:  [name], [name] NO.:  [id number]   MEDICAL RECORD NO.:  [id number]          PATIENT TYPE:  INP   LOCATION:  2399                         FACILITY:  [facility]   PHYSICIAN:  Vida Roller, M.D.   DATE OF BIRTH:  26-Apr-1931   DATE OF PROCEDURE:  04/29/2005  DATE OF DISCHARGE:                              CARDIAC CATHETERIZATION   HISTORY OF PRESENT ILLNESS:  Mr. Bohman is a 77 year old man with known  coronary artery disease, status post angioplasty about 15 years ago.  He  presented to [facility] with acute chest discomfort. He was  admitted to [facility] after transfer to [facility]; found to have an  abnormal troponin.  He was taken to the catheterization lab emergently,  where he was found to have severe three-vessel coronary disease.   PROCEDURES PERFORMED:  1.  Left heart catheterization.  2.  Selective coronary angiography.  3.  Proximal aortography.  4.  Right heart catheterization.  5.  Placement of aortic balloon pump.   PROCEDURE IN DETAIL:  After obtaining informed consent, the patient was  brought to cardiac catheterization laboratory. There he was prepped and  draped in the usual sterile manner, and local anesthetic was obtained over  the right groin using 1% lidocaine without epinephrine.  The right femoral  artery was cannulated using the modified Seldinger technique with a 6-French  10-cm sheath; and left heart catheterization was performed using a 6-French  Judkins left #5 6-French nontorquing right catheter and a 6-French pigtail  catheter. The pigtail catheter crossed the aortic valve, but it was not used  for left ventriculography due to the elevated LVEDP.  The patient then  underwent right heart catheterization, followed by aortography of the  proximal aorta to assure no significant aortic insufficiency. The patient  then had a 40 cc balloon pump placed under fluoroscopic guidance to the  proximal portion of the thoracic  aorta, at the aortic knob.  The patient was  then sent emergently to the operating room for emergency coronary bypass  surgery, with both sheaths in place.   TOTAL FLUOROSCOPIC CONTRAST:  105 cc.   TOTAL FLUOROSCOPY TIME:  11.1 minutes.   RESULTS:  1.  Aortic pressure 109/66 with a mean of 83 mmHg.  2.  Left ventricular pressure 115/19 with an end-diastolic pressure of 37      mmHg.  3.  Right atrial pressure:  A-wave 20, V-wave 17, mean of 16 mmHg.  4.  Right ventricle pressure:  A-wave systolic of 45, diastolic 12, and      RVEDP of 19 mmHg.  5.  Pulmonary artery pressure  45/21 with a mean of 34 mmHg.  6.  Pulmonary capillary wedge pressure:  A-wave 36, V-wave 37, mean of 31      mmHg.  7.  Aortic saturations 96%. Pulmonary artery saturations 60%.  8.  Cardiac output by the Fick method 3.9. Cardiac index 1.6. Thermodilution      7.0, index of 2.0.   CORONARY ANGIOGRAPHY:  1.  The left main coronary artery is severely calcified.  This  is a large      artery and has a 60% stenosis in its mid portion, which is      hemodynamically significant -- with dampening of the catheter upon      engagement.  2.  The left anterior descending coronary artery is a large artery, which is      calcified throughout its proximal and mid portion.  It has a 95%      stenosis in the mid portion, with TIMI-II flow distal to that.  There      are three small diagonals proximally; there is a moderate-caliber      diagonal distal to the stenosis.  3.  The left circumflex coronary artery has severe calcium throughout its      proximal portion and has a 60% proximal lesion.  Prior to a small obtuse      marginal there is a moderate caliber trifurcating second obtuse      marginal, with a 95% stenosis prior to the trifurcation.  It apparently      perfuses most of the anterolateral wall.  4.  The right coronary artery is a moderate-caliber dominant vessel, with a      95% stenosis in its proximal portion  at the takeoff of an acute      marginal.   LEFT VENTRICULOGRAM:  No left ventriculogram was done.  An echocardiogram  was done as a quick look.  His ejection fraction was 30-35%, with anterior  akinesis, no mitral regurgitation and no aortic insufficiency.   ASSESSMENT:  1.  Acute non-ST segment elevation myocardial infarction.  2.  Severe three-vessel coronary disease.  3.  Depressed left ventricular systolic function.   PLAN:  A STAT CVTS consult with emergent surgical intervention.      Vida Roller, M.D.  Electronically Signed     JH/MEDQ  D:  04/29/2005  T:  04/29/2005  Job:  [id number]

## 2010-11-15 NOTE — Op Note (Signed)
NAMEMarland Kitchen  IRBIN, FINES NO.:  [id number]   MEDICAL RECORD NO.:  [id number]          PATIENT TYPE:  INP   LOCATION:  2310                         FACILITY:  [facility]   PHYSICIAN:  Kaylyn Layer. Michelle Piper, M.D.   DATE OF BIRTH:  1931-02-22   DATE OF PROCEDURE:  04/29/2005  DATE OF DISCHARGE:                                 OPERATIVE REPORT   PROCEDURE PERFORMED:  Transesophageal echo probe placement and monitoring  during coronary artery bypass grafting.   INDICATIONS FOR PROCEDURE:  I was consulted by Sheliah Plane, MD to place  a transesophageal echo probe into [name].  He is a patient who had an  MI, had left main coronary artery disease and presents for coronary artery  bypass grafting emergently today.   DESCRIPTION OF PROCEDURE:  After uneventful induction of anesthesia and  endotracheal intubation, the transesophageal echo probe was easily passed  into the stomach.  Initial short axis view of the left ventricle were a  little difficult to obtain, possibly because of air in the stomach; however,  other than the distal septum, the rest of the left ventricle could be seen  to be functioning normally. There was mild left ventricular hypertrophy.  Turning to the mitral valve, the valve structures looked normal in all  quadrants.  The valve tips coapted normally.  On placing Color Flow Doppler  across the valve there was 1+ mitral regurgitation into the left atrium.  Left atrium was normal.  Flow into the pulmonary veins was forward.  Interatrial septum was intact.  The aortic valve was tricuspid.  In the  longitudinal view there was a trace of tricuspid insufficiency; however,  there was no fluttering of the anterior leaflet over the mitral valve.  Left  ventricular outflow tract was normal other than the trace of aortic  insufficiency.  There was appearance of asymmetrical septal hypertrophy.  The maximal point of the interventricular septum measured 2.6 and 7 cm.  Supravalvularly above the aortic valve there was some dilatation.  The  annulus measured 2.75 cm and the supravalvular area measured 4 cm.  The  right side of the heart was within normal limits. There was a trace of  tricuspid insufficiency commensurate with a Swan-Ganz catheter being placed.   The patient was placed on cardiopulmonary bypass by Dr. Tyrone Sage and  underwent coronary artery bypass grafting.   Cardiopulmonary bypass was discontinued uneventfully using minimal  inotropes.  At this time, the left ventricle could not be well visual imaged  but got a sense that there were no wall motion abnormalities.  The mitral  valve structure looked normal in all quadrants and had trace to 1+ mitral  regurgitation. Left atrium was normal.  Flow in the pulmonary veins was  forward. Interatrial septum was normal.  The aortic valve was normal other  than trace of aortic insufficiency and the right side of the heart was  within normal limits.   At the end of the procedure, the transesophageal echo probe was removed from  the patient and he was transferred in stable condition to the  intensive care  unit intubated.           ______________________________  Kaylyn Layer. Michelle Piper, M.D.     KDO/MEDQ  D:  04/29/2005  T:  04/30/2005  Job:  [id number]

## 2010-11-15 NOTE — Op Note (Signed)
NAME:  QUINZELL, MALCOMB NO.:  [id number]   MEDICAL RECORD NO.:  [id number]          PATIENT TYPE:  AMB   LOCATION:  DAY                           FACILITY:  [facility]   PHYSICIAN:  Lionel December, M.D.    DATE OF BIRTH:  [birth year]   DATE OF PROCEDURE:  09/16/2006  DATE OF DISCHARGE:                               OPERATIVE REPORT   PROCEDURE:  Colonoscopy.   INDICATION:  [name] is 77 year old Caucasian male who is undergoing  average risk screening colonoscopy.  Procedure risks were reviewed the  patient, informed consent was obtained.   MEDS FOR CONSCIOUS SEDATION:  Demerol 50 mg IV, Versed 2 mg IV.   FINDINGS:  Procedure performed in endoscopy suite.  The patient's vital  signs and O2 sat were monitored during the procedure and remained  stable.  The patient was placed left lateral position and rectal  examination performed.  No abnormality noted external or digital exam.  Pentax videoscope was placed rectum and advanced under vision into  sigmoid colon beyond.  Preparation was excellent.  He had few scattered  diverticula at sigmoid colon.  Scope was passed into cecum which was  identified by appendiceal orifice and ileocecal valve.  Pictures taken  for the record.  Colonic mucosa was examined for the second time on the  way out and there no polyps or other mucosal abnormalities.  Rectal  mucosa was normal.  Scope was retroflexed to examine anorectal junction  which was unremarkable.  Endoscope was straightened and withdrawn.  The  patient tolerated the procedure well.   FINAL DIAGNOSIS:  Few scattered diverticula at sigmoid colon, otherwise  normal colonoscopy.   RECOMMENDATIONS:  High-fiber diet.  Yearly Hemoccults.  Consider next  screening exam in 10 years from now.  Resume Coumadin and ASA in the  usual dose today.      Lionel December, M.D.  Electronically Signed     NR/MEDQ  D:  09/16/2006  T:  09/16/2006  Job:  [id number]   cc:   Lorin Picket A. Gerda Diss, MD  Fax: [phone]

## 2010-11-15 NOTE — Discharge Summary (Signed)
NAMEMarland Kitchen  Sanders, [name] NO.:  [id number]   MEDICAL RECORD NO.:  [id number]          PATIENT TYPE:  INP   LOCATION:  2035                         FACILITY:  [facility]   PHYSICIAN:  Sheliah Plane, MD    DATE OF BIRTH:  23-May-1931   DATE OF ADMISSION:  04/28/2005  DATE OF DISCHARGE:  05/08/2005                                 DISCHARGE SUMMARY   PRIMARY DIAGNOSIS:  Acute myocardial infarction.   IN-HOSPITAL DIAGNOSES:  1.  Hypertension.  2.  Postoperative atrial fibrillation.  3.  Volume overload.   ALLERGIES:  The patient states allergic to IBUPROFEN.   IN-HOSPITAL OPERATIONS AND PROCEDURES:  1.  Cardiac catheterization with left heart catheterization, selective      coronary angiography, proximal aortography, right heart catheterization,      placement of Erich balloon pump.  2.  Emergency coronary artery bypass grafting times four with a left      internal mammary artery to the left anterior descending coronary artery,      sequential reverse saphenous vein graft to the mid portion of the      circumflex and distal circumflex vessel, and reverse saphenous vein      graft to the posterior descending coronary artery with right leg      endovein harvesting.  3.  Intraoperative transesophageal echocardiogram.   HISTORY AND PHYSICAL/HOSPITAL COURSE:  The patient is a 77 year old male who  has had a previous history of angioplasty more than 10 years ago. He  presented with ongoing chest pain approximately 24 hours prior to surgery.  After hospitalization he continued to have episodic pain. His troponin was  elevated to 6 and ultimately cardiac catheterization was performed by Dr.  Dorethea Clan which showed 60% left main, subtotal LAD, a large circumflex system  with distal disease at trifurcation and mid right lesion. Because of ongoing  chest pain the patient underwent cardiac catheterization and an intra-aortic  balloon pump was placed, and urgent coronary artery bypass  grafting was  recommended. Dr. Tyrone Sage was consulted. He discussed with the patient and  family undergoing emergent coronary artery bypass grafting. He discussed the  risks and benefits of the procedure. The patient acknowledged understanding  and wished to proceed.   The patient was taken emergently to the operating room on April 29, 2005,  where he underwent emergency coronary artery bypass grafting times four  using a left internal mammary artery to the left anterior descending  coronary artery, sequential reverse saphenous vein graft to the mid portion  of the circumflex in the distal circumflex vessels and reverse saphenous  vein graft to the posterior descending coronary artery with right leg  endovein harvesting. The patient tolerated this procedure and was  transferred up to the intensive care unit in stable condition. He required  several inotropic drips to treat his hypotension postoperatively.  The  patient was hemodynamically stable postoperatively. He did require several  days on the vent to stabilize.  The drips were weaned appropriately as blood  pressure tolerated. The balloon pump was weaned as well and was DC'd on  April 30, 2005, on postoperative day one.  The patient tolerated it well  and was seen to have positive pulses distally following removal of the  balloon pump. The patient was extubated on May 01, 2005, postoperative  two.  He was able to tolerate being off the ventilator without difficulty.  Working with his incentive spirometry, the patient was able to be weaned  from his oxygen, saturating 94% to 98% on room air prior to discharge.  Postoperatively, the patient did go into atrial fibrillation. He was started  on amiodarone drip. The patient remained on amiodarone and was given digoxin  protocol. His beta blocker was continued. The patient remained in atrial  fibrillation for the next several days. His rate was able to be controlled  with amiodarone  and digoxin. The patient was started on Lovenox and Coumadin  on postoperative day four. This was adjusted appropriately according to the  patient's PT-INR levels. Dr. Daleen Squibb is planning on cardioverting the patient,  May 06, 2005, due to persistent atrial fibrillation due to the patient's  not requiring emergent cardioversion. Plan was to continue Coumadin for  three weeks and then re-assess at that point. This was discussed with the  patient and family, and they agreed. The patient did develop volume overload  postoperatively. He was started on diuretics. Last weight was 11 pounds  above preoperatively. The patient was discharged on diuretics.  Postoperatively, the patient's incisions were dry, intact, and healing well.  He was out of bed, ambulating well. Again, saturating greater than 90% on  room air prior to discharge.  The patient was discharged to home in atrial  fibrillation with rate control.   The patient is totally ready for discharge on May 08, 2005,  postoperative day one.  Follow-up appointment was scheduled with Dr.  Tyrone Sage for May 29, 2005, at 12 noon. The patient is to follow up with  Dr. Daleen Squibb in two weeks. He will obtain a PA and lateral chest x-ray at this  appointment which he will then bring with him to Dr. Dennie Maizes appointment.  The patient is to obtain his PT-INR bloodwork on May 12, 2005, at  [facility]. The patient is to call and schedule his appointment. [name] received instructions on diet, activity level, and incisional care.  He is told no driving until released to do so, no heavy lifting over 10  pounds. The patient is allowed to shower, washing his incisions using soap  and water. He is to contact the office if he develops any drainage or  openings from any of his incision sites. The patient is told to ambulate three to four times a day, to progress as tolerated. He is told to continue  his breathing exercises. He is educated on  diet to be low fat, low salt. He  acknowledges understanding.   DISCHARGE MEDICATIONS:  1.  Aspirin 325 mg p.o. daily.  2.  Lopressor 150 mg p.o. b.i.d.  3.  Altace 5 mg p.o. b.i.d.  4.  Zocor 40 mg p.o. q.h.s.  5.  Coumadin will be dosed prior to patient's discharge PT-INR level. Again,      Dr. Daleen Squibb will be managing the patient's Coumadin dosing.  6.  Digoxin 0.125 mg p.o. daily.  7.  Lasix 40 mg p.o. daily times five days.  8.  Potassium chloride 20 mEq p.o. daily times five days.  9.  Tylox one to two tabs p.o. q.4-6h. p.r.n. pain.      Stephanie Acre  [place], [place]      Sheliah Plane, MD  Electronically Signed    KMD/MEDQ  D:  05/07/2005  T:  05/07/2005  Job:  [id number]   cc:   Thomas C. Wall, M.D.  [address]  [address]  [place]  [place] [address]

## 2010-11-15 NOTE — H&P (Signed)
[name]  FERMON, URETA NO.:  [id number]   MEDICAL RECORD NO.:  [id number]          PATIENT TYPE:  INP   LOCATION:  2922                         FACILITY:  [facility]   PHYSICIAN:  Jesse Sans. Wall, M.D.   DATE OF BIRTH:  1931/02/11   DATE OF ADMISSION:  04/28/2005  DATE OF DISCHARGE:                                HISTORY & PHYSICAL   CHIEF COMPLAINT:  Chest aching since early this afternoon while working in  the yard.   HISTORY OF PRESENT ILLNESS:  [name] is a 77 year old married white male,  retired principal, from [place], who has known coronary disease.  He had  a PTCA per his recollection about 10 years ago at [facility]  Records are currently pending.   He has had a history of hypertension and hyperlipidemia but he took himself  off his medications about three years ago.  He has not seen his primary care  doctor in several years.   Around noon today he developed some aching in his chest while in the yard.  After lunch it got worse.  He went to the emergency room at [facility].   EKG there showed normal sinus rhythm with a left axis deviation with Q's  anteriorly consistent with old anterior infarct, undetermined age.  He had  no acute ST segment changes.   He was treated with aspirin, IV nitroglycerin and IV heparin.  He still had  some mild aching in his chest.   Upon arrival here at [facility], we repeated his EKG, which demonstrates sinus  rhythm with a first degree AV block, left axis deviation, biphasic T in aVL,  old anterior wall infarct, question of old inferior wall infarct.  There are  no acute changes other than minimal ST J-point elevation in V2 and V3.   PAST MEDICAL HISTORY:  Remarkable for the above.  He has had kidney stones  and has had a laparoscopy and kidney stone removal for this in the past.   He is intolerant of ibuprofen.   His only medicine at home was aspirin.  He discontinued his other  medications.  He has been on  Crestor in the past apparently.  He could not  remember his blood pressure medicine.   SOCIAL HISTORY:  He is married and lives in [place].  He has no children.  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  He worked there  for 47 years.   He does not smoke, does not drink.   FAMILY HISTORY:  Noncontributory.   REVIEW OF SYSTEMS:  Negative other than the HPI.  He denies any bleeding,  any trouble swallowing, any indigestion or reflux, no melena.  His weight  has been stable.   PHYSICAL EXAMINATION:  GENERAL:  Here in the emergency room, he is in no  acute distress.  Family is visiting along with his chaplain.  His pain is  minimal, he says, at present.  His skin is warm and dry.  VITAL SIGNS:  Blood pressure is 141/77, his heart rate is 70 in sinus  rhythm, his O2 saturation is 97-98%, respiratory rate is 18.  He is  afebrile.  SKIN:  Warm and dry.  HEENT:  He is balding.  He has an upper plate.  On oral exam he has some  cheilosis.  PERRLA, extraocular movements are intact.  He wears glasses.  NECK:  Carotid upstrokes were equal bilaterally without bruits.  There is no  obvious JVD.  Thyroid is not palpable.  Trachea is midline.  CHEST:  Lungs are clear to auscultation.  CARDIAC:  Heart reveals a poorly-defined PMI.  He has a soft S1, S2 that is  split.  There is a soft systolic murmur along the left sternal border.  There is no gallop.  ABDOMEN:  Protuberant, good bowel sounds.  There is no midline bruit.  There  is no hepatic enlargement or no organomegaly, though exam is difficult.  His  femoral pulses are 2+/4+ without bruits.  EXTREMITIES:  His lower extremities show 1+ pretibial edema bilaterally.  His dorsalis pedis pulses are 2+/4+, posterior tibials are 1+/4+.  NEUROLOGIC:  Intact.   Blood work from [facility] demonstrated a troponin on point of care markers  that was elevated at 0.1.  His CPK-MB maximum was only 6.2 with 1-8 being  normal.  PT and  PTT are normal.  CBC and comprehensive metabolic panel are  unremarkable.  I do not see a chest x-ray.   ASSESSMENT:  1.  Unstable angina/acute coronary syndrome.  2.  History of coronary artery disease with percutaneous transluminal      coronary angioplasty about 10 years ago per his history.  Looking at his      EKG, it looks like he has had an old anterior myocardial infarction.  It      is possible he could have had an old inferior infarct, but he looks like      it is mostly left axis deviation.  3.  Hypertension.  4.  Hyperlipidemia.  5.  Obesity.  6.  History of kidney stone.  7.  Noncompliance.   PLAN:  1.  Admit to step-down.  2.  Serial cardiac enzymes.  3.  Lipid panel and then begin statin.  4.  Aspirin.  5.  IV nitroglycerin.  6.  IV heparin.  7.  Lopressor 25 mg p.o. q.6h.  8.  Cardiac catheterization tomorrow.  Indications, risks and potential      benefits have been discussed.  The patient has had this before and      recalls it fairly vividly.  He had no reaction to dye.  The patient and      wife agree to proceed as discussed.      Thomas C. Wall, M.D.  Electronically Signed     TCW/MEDQ  D:  04/28/2005  T:  04/29/2005  Job:  [id number]   cc:   Kirk Ruths, M.D.  Fax: [phone]

## 2010-11-15 NOTE — Op Note (Signed)
NAME:  [name], [name] NO.:  [id number]   MEDICAL RECORD NO.:  [id number]          PATIENT TYPE:  INP   LOCATION:  2310                         FACILITY:  [facility]   PHYSICIAN:  Sheliah Plane, MD    DATE OF BIRTH:  [birth year]   DATE OF PROCEDURE:  04/29/2005  DATE OF DISCHARGE:                                 OPERATIVE REPORT   PREOPERATIVE DIAGNOSIS:  Acute myocardial infarction.   POSTOPERATIVE DIAGNOSIS:  Acute myocardial infarction.   PROCEDURE:  Emergency coronary artery bypass grafting x 4 with the left  internal mammary artery to the left anterior descending coronary artery,  sequential reverse saphenous vein graft to the midportion of the circumflex  and the distal circumflex vessel and reverse saphenous vein graft to the  posterior descending coronary artery with right leg endovein harvesting.   SURGEON:  Sheliah Plane, M.D.   FIRST ASSISTANT:  Coral Ceo, P.A.   BRIEF HISTORY:  The patient is a 77 year old male who has had a previous  history of angioplasty more than 10 years ago.  He presented with ongoing  chest pain approximately 24 hours prior to surgery.  After hospitalization,  he continued to have episodic pain.  His troponin elevated to 6.  Ultimately  a cardiac catheterization was performed by Dr. Dorethea Clan, which showed 60% left  main, subtotal LAD, a large circumflex system with distal disease at  trifurcation and mid right lesion.  Because of ongoing chest pain, the  patient underwent cardiac catheterization and intra-aortic balloon pump was  placed and urgent coronary artery bypass grafting was recommended.  The  patient agreed and signed informed consent.   DESCRIPTION OF PROCEDURE:  With the Swan-Ganz and arterial line monitors in  place, the patient underwent general endotracheal anesthesia.  As noted, the  intra-aortic balloon pump had been placed in the cath lab.  The skin of the  chest was prepped with Betadine and draped in the  usual sterile manner.  The  vein was harvested endoscopically from the right lower extremity, was of  good quality and caliber.  A medium sternotomy was performed.  Left internal  mammary artery was dissected down as a pedicle graft.  The distal artery was  divided.  It had good flow.  The pericardium was opened.  Overall  ventricular function appeared preserved with anterior hypokinesis and  evidence of severe left ventricular hypertrophy.   The patient was systemically heparinized.  The ascending aorta was  cannulated.  The right atrium was cannulated.  He was placed on  cardiopulmonary bypass, 2.4 liters per minute/m2.  Sites of anastomosis were  selected and dissected out of the epicardium.  The patient's temperature was  cooled to 30 degrees.  Aortic cross-clamp was applied and 500 mL of cold-  blood potassium cardioplegia was administered with rapid diastolic arrest of  the heart.  Myocardial septal temperature was monitored throughout the cross-  clamp.  Attention was turned first to the posterior descending coronary  artery which was opened then a 1.5-mm probe using a running 7-0 Prolene, the  distal anastomosis was performed.  Attention was then turned to the midportion of the circumflex which was  diffusely diseased.  It did admit a 1.5-mm probe proximally and distally.  Using a longitudinal side-to-side anastomosis, it was carried out with a  running 7-0 Prolene.  The distance of the same vein was then carried  parallel to the vessel after trifurcation where the main trunk of the  circumflex was smaller, but bypassable. The vessel was opened and would  admit a 1.5-mm probe.  Using a running 7-0 Prolene, distal anastomosis was  performed.  The LAD was partially intramyocardial and was very diffusely  diseased throughout the course.  It was located.  A portion was opened and  using running 8-0 Prolene, the left internal mammary artery was anastomosed  to the left anterior  descending coronary artery.  With the cross-clamp still  in place, two punch aortotomies were performed, each two vein grafts  anastomosed to the ascending aorta.  The aorta was packed away from the  grafts and aorta.  The patient's aortic cross-clamp was then removed with  total cross-clamp time of 83 minutes.  The patient remained hemodynamically  stable.  He was decannulated in the usual fashion.  Protamine sulfate was  administered with operative field hemostatic, two atrial and two ventricular  pacing wires were brought out.  Left pleural tubes, two mediastinal tubes  left in place.  The sternum was closed with #6 stainless steel wire.  The  fascia was closed with interrupted 0 Vicryl, running 3-0 Vicryl in  subcutaneous tissue, a 4-0 subcuticular stitch in the skin edges.  Dry  dressings were applied.  The sponge and needle counts were reported as  correct at the completion of the procedure.   The patient was transferred to surgical intensive care unit with the intra-  aortic balloon pump still in place.  Cross-clamp time was 83 minutes.  Total  pump time was 115 minutes.      Sheliah Plane, MD  Electronically Signed     EG/MEDQ  D:  04/29/2005  T:  04/29/2005  Job:  [id number]   cc:   Vida Roller, M.D.  Fax: [phone]   [employer]

## 2010-11-15 NOTE — Procedures (Signed)
NAME:  [name], [name] NO.:  [id number]   MEDICAL RECORD NO.:  [id number]          PATIENT TYPE:  OUT   LOCATION:  RAD                           FACILITY:  [facility]   PHYSICIAN:  Wellman Bing, M.D. LHCDATE OF BIRTH:  1931-01-11   DATE OF PROCEDURE:  09/26/2005  DATE OF DISCHARGE:  09/26/2005                                  ECHOCARDIOGRAM   REFERRING:  Drs. Scott Gerda Diss and [place]   CLINICAL DATA:  A 77 year old gentleman with prior CABG surgery.   M-MODE:  Aorta 4.4, left atrium 4.8, septum 1.6, posterior wall 1.4, LV  diastole 5.2, LV systole 3.9.   1.  Technically difficult and limited echocardiographic study.  2.  Mild to moderate right atrial enlargement; moderate left atrial      dilatation.  Normal right ventricular size and function.  3.  Mild aortic valvular and ascending aortic sclerosis with normal valve      function.  Mild aortic root dilatation.  4.  Normal mitral valve; mild annular calcification; mild regurgitation.  5.  Normal tricuspid valve; mild regurgitation.  6.  Mild IVC dilatation.  7.  Pulmonic valve and proximal pulmonary artery not well imaged.  8.  Normal left ventricular size; mild to moderate hypertrophy; distal      septum and apex appear severely hypokinetic to akinetic.  Overall left      ventricular systolic function is mildly impaired.  The LV was assessed      with the aid of intravenous contrast.       Bing, M.D. [facility]  Electronically Signed     RR/MEDQ  D:  09/26/2005  T:  09/28/2005  Job:  [id number]

## 2010-11-21 ENCOUNTER — Ambulatory Visit (INDEPENDENT_AMBULATORY_CARE_PROVIDER_SITE_OTHER): Payer: Medicare Other | Admitting: *Deleted

## 2010-11-21 DIAGNOSIS — Z7901 Long term (current) use of anticoagulants: Secondary | ICD-10-CM

## 2010-11-21 DIAGNOSIS — I4891 Unspecified atrial fibrillation: Secondary | ICD-10-CM

## 2010-12-04 ENCOUNTER — Other Ambulatory Visit: Payer: Self-pay | Admitting: *Deleted

## 2010-12-04 MED ORDER — METOPROLOL TARTRATE 100 MG PO TABS: 100.0000 mg | ORAL_TABLET | Freq: Two times a day (BID) | ORAL | Status: DC

## 2010-12-10 ENCOUNTER — Other Ambulatory Visit: Payer: Self-pay | Admitting: Cardiology

## 2010-12-11 ENCOUNTER — Other Ambulatory Visit: Payer: Self-pay

## 2010-12-11 MED ORDER — AMLODIPINE-ATORVASTATIN 10-20 MG PO TABS: 1.0000 | ORAL_TABLET | Freq: Every day | ORAL | Status: DC

## 2010-12-19 ENCOUNTER — Ambulatory Visit (INDEPENDENT_AMBULATORY_CARE_PROVIDER_SITE_OTHER): Payer: Medicare Other | Admitting: *Deleted

## 2010-12-19 DIAGNOSIS — Z7901 Long term (current) use of anticoagulants: Secondary | ICD-10-CM

## 2010-12-19 DIAGNOSIS — I4891 Unspecified atrial fibrillation: Secondary | ICD-10-CM

## 2011-01-16 ENCOUNTER — Ambulatory Visit (INDEPENDENT_AMBULATORY_CARE_PROVIDER_SITE_OTHER): Payer: Medicare Other | Admitting: *Deleted

## 2011-01-16 DIAGNOSIS — Z7901 Long term (current) use of anticoagulants: Secondary | ICD-10-CM

## 2011-01-16 DIAGNOSIS — I4891 Unspecified atrial fibrillation: Secondary | ICD-10-CM

## 2011-02-17 ENCOUNTER — Ambulatory Visit (INDEPENDENT_AMBULATORY_CARE_PROVIDER_SITE_OTHER): Payer: Medicare Other | Admitting: *Deleted

## 2011-02-17 DIAGNOSIS — Z7901 Long term (current) use of anticoagulants: Secondary | ICD-10-CM

## 2011-02-17 DIAGNOSIS — I4891 Unspecified atrial fibrillation: Secondary | ICD-10-CM

## 2011-03-17 ENCOUNTER — Ambulatory Visit (INDEPENDENT_AMBULATORY_CARE_PROVIDER_SITE_OTHER): Payer: Medicare Other | Admitting: *Deleted

## 2011-03-17 DIAGNOSIS — I4891 Unspecified atrial fibrillation: Secondary | ICD-10-CM

## 2011-03-17 DIAGNOSIS — Z7901 Long term (current) use of anticoagulants: Secondary | ICD-10-CM

## 2011-03-17 LAB — POCT INR: INR: 2.9

## 2011-04-14 LAB — I-STAT 8, (EC8 V) (CONVERTED LAB)
Chloride: 106
Glucose, Bld: 114 — ABNORMAL HIGH
HCT: 44
Potassium: 4.4
Sodium: 140
pH, Ven: 7.382 — ABNORMAL HIGH

## 2011-04-14 LAB — POCT I-STAT CREATININE
Creatinine, Ser: 1.1
Operator id: 234501

## 2011-04-14 LAB — POCT CARDIAC MARKERS: CKMB, poc: 2.3

## 2011-04-14 LAB — PROTIME-INR: INR: 2.2 — ABNORMAL HIGH

## 2011-04-21 ENCOUNTER — Ambulatory Visit (INDEPENDENT_AMBULATORY_CARE_PROVIDER_SITE_OTHER): Payer: Medicare Other | Admitting: *Deleted

## 2011-04-21 DIAGNOSIS — I4891 Unspecified atrial fibrillation: Secondary | ICD-10-CM

## 2011-04-21 DIAGNOSIS — Z7901 Long term (current) use of anticoagulants: Secondary | ICD-10-CM

## 2011-04-21 LAB — POCT INR: INR: 1.8

## 2011-05-05 ENCOUNTER — Other Ambulatory Visit: Payer: Self-pay | Admitting: Adult Health

## 2011-05-05 ENCOUNTER — Other Ambulatory Visit: Payer: Self-pay | Admitting: *Deleted

## 2011-05-05 MED ORDER — LISINOPRIL 40 MG PO TABS: 40.0000 mg | ORAL_TABLET | Freq: Every day | ORAL | Status: DC

## 2011-05-06 ENCOUNTER — Encounter: Payer: Self-pay | Admitting: Cardiology

## 2011-05-06 ENCOUNTER — Other Ambulatory Visit: Payer: Self-pay | Admitting: *Deleted

## 2011-05-06 MED ORDER — FUROSEMIDE 40 MG PO TABS: 40.0000 mg | ORAL_TABLET | Freq: Two times a day (BID) | ORAL | Status: DC

## 2011-05-08 ENCOUNTER — Encounter: Payer: Self-pay | Admitting: Cardiology

## 2011-05-08 ENCOUNTER — Ambulatory Visit (INDEPENDENT_AMBULATORY_CARE_PROVIDER_SITE_OTHER): Payer: Medicare Other | Admitting: Cardiology

## 2011-05-08 ENCOUNTER — Ambulatory Visit (INDEPENDENT_AMBULATORY_CARE_PROVIDER_SITE_OTHER): Payer: Medicare Other | Admitting: *Deleted

## 2011-05-08 ENCOUNTER — Other Ambulatory Visit: Payer: Self-pay | Admitting: *Deleted

## 2011-05-08 ENCOUNTER — Encounter: Payer: Self-pay | Admitting: *Deleted

## 2011-05-08 DIAGNOSIS — Z7901 Long term (current) use of anticoagulants: Secondary | ICD-10-CM

## 2011-05-08 DIAGNOSIS — I4891 Unspecified atrial fibrillation: Secondary | ICD-10-CM

## 2011-05-08 DIAGNOSIS — I10 Essential (primary) hypertension: Secondary | ICD-10-CM

## 2011-05-08 DIAGNOSIS — E785 Hyperlipidemia, unspecified: Secondary | ICD-10-CM

## 2011-05-08 DIAGNOSIS — E7800 Pure hypercholesterolemia, unspecified: Secondary | ICD-10-CM

## 2011-05-08 DIAGNOSIS — E663 Overweight: Secondary | ICD-10-CM

## 2011-05-08 LAB — POCT INR: INR: 2.3

## 2011-05-08 MED ORDER — ATORVASTATIN CALCIUM 40 MG PO TABS: 40.0000 mg | ORAL_TABLET | Freq: Every day | ORAL | Status: DC

## 2011-05-08 NOTE — Assessment & Plan Note (Signed)
Atorvastatin will be started at a dose of 40 mg per day with a repeat lipid profile in one month.

## 2011-05-08 NOTE — Patient Instructions (Signed)
Your physician recommends that you schedule a follow-up appointment in: 1 year  Your physician has recommended you make the following change in your medication:  Atorvastatin 40 mg daily  Your physician recommends that you return for lab work in: 1 month (CBC, CMP, Lipids)  Stool cards and return asap

## 2011-05-08 NOTE — Progress Notes (Signed)
HPI : [name] returns to the office as scheduled for continuing assessment and treatment of coronary disease and cardiovascular risk factors, now 6 years following CABG surgery.  He continues to do generally well with no chest discomfort, orthopnea, PND, lightheadedness, syncope or pedal edema.  He does note dyspnea on exertion, but this occurs with tasks such as tying his shoes rather than with walking.  Blood pressure control has been excellent.  He did not bring his medications with him as instructed, but it appears that amlodipine has been substituted for Caduet.  As a result, he is receiving no lipid-lowering treatment.  Current Outpatient Prescriptions on File Prior to Visit  Medication Sig Dispense Refill  . amlodipine 10 MG per tablet Take 1 tablet by mouth daily.  30 tablet  4  . COUMADIN 5 MG tablet TAKE 1 TABLET AS DIRECTED BY COUMADIN CLINIC.  60 each  2  . furosemide (LASIX) 40 MG tablet Take 1 tablet (40 mg total) by mouth 2 (two) times daily.  30 tablet  6  . LASIX 40 MG tablet TAKE (1) TABLET BY MOUTH ONCE DAILY.  30 each  6  . lisinopril (PRINIVIL,ZESTRIL) 40 MG tablet Take 1 tablet (40 mg total) by mouth daily.  30 tablet  12  . metoprolol (LOPRESSOR) 100 MG tablet Take 1 tablet (100 mg total) by mouth 2 (two) times daily.  60 tablet  6     Allergies  Allergen Reactions  . Ibuprofen     REACTION: skin rash      Past medical history, social history, and family history reviewed and updated.  ROS: See history of present illness  PHYSICAL EXAM: BP 122/72  Pulse 52  Wt 128.876 kg (284 lb 1.9 oz) ; 9 pound weight gain General-Well developed; no acute distress Body habitus-obese Neck-no JVD; no carotid bruits Lungs-clear lung fields; resonant to percussion; decreased breath sounds Cardiovascular-normal PMI; normal S1 and S2; irregular rhythm Abdomen-normal bowel sounds; soft and non-tender without masses or organomegaly Musculoskeletal-No deformities, no cyanosis or  clubbing Neurologic-Normal cranial nerves; symmetric strength and tone Skin-Warm, no significant lesions Extremities-distal pulses intact; trace edema  ASSESSMENT AND PLAN:

## 2011-05-08 NOTE — Assessment & Plan Note (Signed)
CBC and stool for Hemoccult testing will be obtained to exclude occult GI blood loss.

## 2011-05-08 NOTE — Assessment & Plan Note (Signed)
Patient is asymptomatic with good control of heart rate.  Current medications will be continued.

## 2011-05-08 NOTE — Assessment & Plan Note (Signed)
Weight loss advised and dietary strategies as well as advisability of increased exercise discussed.

## 2011-05-08 NOTE — Assessment & Plan Note (Signed)
Blood pressure control is excellent; current medications will be continued. 

## 2011-05-09 ENCOUNTER — Other Ambulatory Visit: Payer: Self-pay | Admitting: *Deleted

## 2011-05-09 MED ORDER — FUROSEMIDE 40 MG PO TABS: 40.0000 mg | ORAL_TABLET | Freq: Every day | ORAL | Status: DC

## 2011-05-21 ENCOUNTER — Ambulatory Visit (INDEPENDENT_AMBULATORY_CARE_PROVIDER_SITE_OTHER): Payer: Medicare Other

## 2011-05-21 DIAGNOSIS — Z7901 Long term (current) use of anticoagulants: Secondary | ICD-10-CM

## 2011-05-23 ENCOUNTER — Telehealth: Payer: Self-pay | Admitting: *Deleted

## 2011-05-23 NOTE — Telephone Encounter (Signed)
Left message regarding negative stool cards.

## 2011-05-30 ENCOUNTER — Encounter: Payer: Self-pay | Admitting: Cardiology

## 2011-06-05 ENCOUNTER — Ambulatory Visit (INDEPENDENT_AMBULATORY_CARE_PROVIDER_SITE_OTHER): Payer: Medicare Other | Admitting: *Deleted

## 2011-06-05 DIAGNOSIS — Z7901 Long term (current) use of anticoagulants: Secondary | ICD-10-CM

## 2011-06-05 DIAGNOSIS — I4891 Unspecified atrial fibrillation: Secondary | ICD-10-CM

## 2011-06-05 LAB — POCT INR: INR: 2.3

## 2011-06-06 ENCOUNTER — Other Ambulatory Visit: Payer: Self-pay | Admitting: Cardiology

## 2011-06-07 LAB — CBC
HCT: 46.4 % (ref 39.0–52.0)
Platelets: 197 10*3/uL (ref 150–400)
RDW: 14.8 % (ref 11.5–15.5)
WBC: 6.4 10*3/uL (ref 4.0–10.5)

## 2011-06-07 LAB — COMPREHENSIVE METABOLIC PANEL
ALT: 16 U/L (ref 0–53)
AST: 19 U/L (ref 0–37)
Albumin: 4 g/dL (ref 3.5–5.2)
Calcium: 9.1 mg/dL (ref 8.4–10.5)
Chloride: 105 mEq/L (ref 96–112)
Potassium: 6 mEq/L — ABNORMAL HIGH (ref 3.5–5.3)
Sodium: 140 mEq/L (ref 135–145)
Total Protein: 7 g/dL (ref 6.0–8.3)

## 2011-06-09 ENCOUNTER — Other Ambulatory Visit: Payer: Self-pay | Admitting: *Deleted

## 2011-06-10 ENCOUNTER — Other Ambulatory Visit: Payer: Self-pay | Admitting: *Deleted

## 2011-06-10 ENCOUNTER — Encounter: Payer: Self-pay | Admitting: *Deleted

## 2011-06-10 DIAGNOSIS — I10 Essential (primary) hypertension: Secondary | ICD-10-CM

## 2011-06-10 MED ORDER — LISINOPRIL 10 MG PO TABS: 10.0000 mg | ORAL_TABLET | Freq: Every day | ORAL | Status: DC

## 2011-06-10 MED ORDER — AMLODIPINE BESYLATE 10 MG PO TABS: 10.0000 mg | ORAL_TABLET | Freq: Every day | ORAL | Status: DC

## 2011-06-11 ENCOUNTER — Other Ambulatory Visit: Payer: Self-pay | Admitting: *Deleted

## 2011-06-11 MED ORDER — LISINOPRIL 40 MG PO TABS: 40.0000 mg | ORAL_TABLET | Freq: Every day | ORAL | Status: DC

## 2011-06-11 MED ORDER — AMLODIPINE BESY-BENAZEPRIL HCL 10-40 MG PO CAPS: 1.0000 | ORAL_CAPSULE | Freq: Every day | ORAL | Status: DC

## 2011-06-16 ENCOUNTER — Other Ambulatory Visit: Payer: Self-pay | Admitting: Cardiology

## 2011-06-19 ENCOUNTER — Other Ambulatory Visit: Payer: Self-pay | Admitting: Otolaryngology

## 2011-06-19 DIAGNOSIS — H9190 Unspecified hearing loss, unspecified ear: Secondary | ICD-10-CM

## 2011-06-25 ENCOUNTER — Ambulatory Visit
Admission: RE | Admit: 2011-06-25 | Discharge: 2011-06-25 | Disposition: A | Discharge status: Home / Self Care | Payer: Medicare Other | Source: Ambulatory Visit | Attending: Otolaryngology | Admitting: Otolaryngology

## 2011-06-25 DIAGNOSIS — H9190 Unspecified hearing loss, unspecified ear: Secondary | ICD-10-CM

## 2011-06-25 MED ORDER — GADOBENATE DIMEGLUMINE 529 MG/ML IV SOLN
20.0000 mL | Freq: Once | INTRAVENOUS | Status: AC | PRN
  Administered 2011-06-25: 20 mL via INTRAVENOUS

## 2011-06-26 ENCOUNTER — Other Ambulatory Visit: Payer: Self-pay | Admitting: Cardiology

## 2011-06-27 LAB — BASIC METABOLIC PANEL
CO2: 25 mEq/L (ref 19–32)
Glucose, Bld: 114 mg/dL — ABNORMAL HIGH (ref 70–99)
Potassium: 4.8 mEq/L (ref 3.5–5.3)
Sodium: 141 mEq/L (ref 135–145)

## 2011-07-03 ENCOUNTER — Ambulatory Visit (INDEPENDENT_AMBULATORY_CARE_PROVIDER_SITE_OTHER): Payer: Medicare Other | Admitting: *Deleted

## 2011-07-03 ENCOUNTER — Other Ambulatory Visit: Payer: Self-pay | Admitting: *Deleted

## 2011-07-03 DIAGNOSIS — I4891 Unspecified atrial fibrillation: Secondary | ICD-10-CM

## 2011-07-03 DIAGNOSIS — Z7901 Long term (current) use of anticoagulants: Secondary | ICD-10-CM

## 2011-07-03 MED ORDER — LISINOPRIL 10 MG PO TABS: 10.0000 mg | ORAL_TABLET | Freq: Every day | ORAL | Status: DC

## 2011-07-16 ENCOUNTER — Other Ambulatory Visit: Payer: Self-pay | Admitting: *Deleted

## 2011-07-16 MED ORDER — METOPROLOL TARTRATE 100 MG PO TABS: 100.0000 mg | ORAL_TABLET | Freq: Two times a day (BID) | ORAL | Status: DC

## 2011-08-07 ENCOUNTER — Ambulatory Visit (INDEPENDENT_AMBULATORY_CARE_PROVIDER_SITE_OTHER): Payer: Medicare Other | Admitting: *Deleted

## 2011-08-07 DIAGNOSIS — I4891 Unspecified atrial fibrillation: Secondary | ICD-10-CM

## 2011-08-07 DIAGNOSIS — Z7901 Long term (current) use of anticoagulants: Secondary | ICD-10-CM

## 2011-09-18 ENCOUNTER — Ambulatory Visit (INDEPENDENT_AMBULATORY_CARE_PROVIDER_SITE_OTHER): Payer: Medicare Other | Admitting: *Deleted

## 2011-09-18 DIAGNOSIS — I4891 Unspecified atrial fibrillation: Secondary | ICD-10-CM

## 2011-09-18 DIAGNOSIS — Z7901 Long term (current) use of anticoagulants: Secondary | ICD-10-CM

## 2011-09-18 LAB — POCT INR: INR: 2.6

## 2011-10-30 ENCOUNTER — Ambulatory Visit (INDEPENDENT_AMBULATORY_CARE_PROVIDER_SITE_OTHER): Payer: Medicare Other | Admitting: *Deleted

## 2011-10-30 DIAGNOSIS — I4891 Unspecified atrial fibrillation: Secondary | ICD-10-CM

## 2011-10-30 DIAGNOSIS — Z7901 Long term (current) use of anticoagulants: Secondary | ICD-10-CM

## 2011-10-30 LAB — POCT INR: INR: 2.1

## 2011-12-11 ENCOUNTER — Ambulatory Visit (INDEPENDENT_AMBULATORY_CARE_PROVIDER_SITE_OTHER): Payer: Medicare Other | Admitting: *Deleted

## 2011-12-11 DIAGNOSIS — I4891 Unspecified atrial fibrillation: Secondary | ICD-10-CM

## 2011-12-11 DIAGNOSIS — Z7901 Long term (current) use of anticoagulants: Secondary | ICD-10-CM

## 2011-12-11 LAB — POCT INR: INR: 1.8

## 2011-12-22 ENCOUNTER — Other Ambulatory Visit: Payer: Self-pay | Admitting: Cardiology

## 2012-01-05 ENCOUNTER — Ambulatory Visit (INDEPENDENT_AMBULATORY_CARE_PROVIDER_SITE_OTHER): Payer: Medicare Other | Admitting: *Deleted

## 2012-01-05 DIAGNOSIS — I4891 Unspecified atrial fibrillation: Secondary | ICD-10-CM

## 2012-01-05 DIAGNOSIS — Z7901 Long term (current) use of anticoagulants: Secondary | ICD-10-CM

## 2012-01-26 ENCOUNTER — Other Ambulatory Visit: Payer: Self-pay | Admitting: Cardiology

## 2012-01-26 MED ORDER — METOPROLOL TARTRATE 100 MG PO TABS: 100.0000 mg | ORAL_TABLET | Freq: Two times a day (BID) | ORAL | Status: DC

## 2012-02-09 ENCOUNTER — Ambulatory Visit (INDEPENDENT_AMBULATORY_CARE_PROVIDER_SITE_OTHER): Payer: Medicare Other | Admitting: *Deleted

## 2012-02-09 DIAGNOSIS — I4891 Unspecified atrial fibrillation: Secondary | ICD-10-CM

## 2012-02-09 DIAGNOSIS — Z7901 Long term (current) use of anticoagulants: Secondary | ICD-10-CM

## 2012-02-09 LAB — POCT INR: INR: 2.2

## 2012-03-22 ENCOUNTER — Ambulatory Visit (INDEPENDENT_AMBULATORY_CARE_PROVIDER_SITE_OTHER): Payer: Medicare Other | Admitting: *Deleted

## 2012-03-22 DIAGNOSIS — I4891 Unspecified atrial fibrillation: Secondary | ICD-10-CM

## 2012-03-22 DIAGNOSIS — Z7901 Long term (current) use of anticoagulants: Secondary | ICD-10-CM

## 2012-03-22 LAB — POCT INR: INR: 2.5

## 2012-04-26 ENCOUNTER — Ambulatory Visit (INDEPENDENT_AMBULATORY_CARE_PROVIDER_SITE_OTHER): Payer: Medicare Other | Admitting: *Deleted

## 2012-04-26 DIAGNOSIS — Z7901 Long term (current) use of anticoagulants: Secondary | ICD-10-CM

## 2012-04-26 DIAGNOSIS — I4891 Unspecified atrial fibrillation: Secondary | ICD-10-CM

## 2012-05-10 ENCOUNTER — Encounter: Payer: Self-pay | Admitting: Cardiology

## 2012-05-10 ENCOUNTER — Ambulatory Visit (INDEPENDENT_AMBULATORY_CARE_PROVIDER_SITE_OTHER): Payer: Medicare Other | Admitting: Cardiology

## 2012-05-10 VITALS — BP 122/74 | HR 71 | Ht 72.0 in | Wt 273.8 lb

## 2012-05-10 DIAGNOSIS — E785 Hyperlipidemia, unspecified: Secondary | ICD-10-CM

## 2012-05-10 DIAGNOSIS — E663 Overweight: Secondary | ICD-10-CM

## 2012-05-10 DIAGNOSIS — Z7901 Long term (current) use of anticoagulants: Secondary | ICD-10-CM

## 2012-05-10 DIAGNOSIS — N200 Calculus of kidney: Secondary | ICD-10-CM

## 2012-05-10 DIAGNOSIS — I4891 Unspecified atrial fibrillation: Secondary | ICD-10-CM

## 2012-05-10 DIAGNOSIS — I10 Essential (primary) hypertension: Secondary | ICD-10-CM

## 2012-05-10 NOTE — Assessment & Plan Note (Signed)
Patient is doing generally well without definite symptoms attributable to atrial fibrillation; however, he has had lightheadedness and is bradycardic.  Toprol will be further reduced to 50 mg per day with patient monitoring blood pressure and heart rate at home.

## 2012-05-10 NOTE — Assessment & Plan Note (Addendum)
Blood pressure control has been excellent.  Patient will monitor following a decrease in his dose of metoprolol, but I doubt he will need additional pharmacologic therapy.

## 2012-05-10 NOTE — Patient Instructions (Addendum)
Your physician recommends that you schedule a follow-up appointment in: 1 year  Stool Cards  Your physician recommends that you return for lab work in: Today  ADDENDUM 05/11/12:  DECREASE METOPROLOL TO 50 MG BID. PT MADE AWARE.

## 2012-05-10 NOTE — Assessment & Plan Note (Addendum)
Excellent lipid profile when last assessed 11 months ago.  Pharmacologic therapy does not appear necessary.

## 2012-05-10 NOTE — Assessment & Plan Note (Signed)
Patient has done well with anticoagulation.  A repeat CBC and stool for Hemoccult testing will be obtained to exclude occult GI blood loss.

## 2012-05-10 NOTE — Assessment & Plan Note (Signed)
11 pound weight loss noted since last year.  Patient urged to continue to restrict calories and to remain active.

## 2012-05-10 NOTE — Progress Notes (Signed)
Patient ID: [name], male   DOB: [birth year], 78 y.o.   MRN: [id number]  HPI: Scheduled return visit for this very nice gentleman with long-standing atrial fibrillation requiring anticoagulation, hypertension and hyperlipidemia.  Since his previous visit, he has done extremely well.  He is active including caring for his wife, who recently sustained an ankle fracture, without any cardiopulmonary symptoms.  He has not been hospitalized or required urgent medical care nor has he developed any new medical problems.  Prior to Admission medications   Medication Sig Start Date End Date Taking? Authorizing Provider  amLODipine-benazepril (LOTREL) 10-40 MG per capsule Take 1 capsule by mouth daily. 06/11/11 06/10/12 Yes Kathlen Brunswick, MD  COUMADIN 5 MG tablet TAKE 1 TABLET DAILY AS DIRECTED. 06/16/11  Yes Jonelle Sidle, MD  furosemide (LASIX) 40 MG tablet Take 1 tablet (40 mg total) by mouth daily. 05/09/11  Yes Kathlen Brunswick, MD  lisinopril (PRINIVIL) 10 MG tablet Take 1 tablet (10 mg total) by mouth daily. 07/03/11 07/02/12 Yes Kathlen Brunswick, MD  metoprolol (LOPRESSOR) 100 MG tablet TAKE (1) TABLET TWICE DAILY. 12/22/11  Yes Kathlen Brunswick, MD   Allergies  Allergen Reactions  . Ibuprofen     REACTION: skin rash     Past medical history, social history, and family history reviewed and updated.  ROS: Denies orthopnea, PND, palpitations or syncope.  He has had occasional lightheadedness.  No melena, hematochezia or other evidence for occult GI blood loss.  All other systems reviewed and are negative.  PHYSICAL EXAM: BP 122/74  Pulse 71  Ht 6' (1.829 m)  Wt 124.195 kg (273 lb 12.8 oz)  BMI 37.13 kg/m2  SpO2 95%  General-Well developed; no acute distress Body habitus-Moderately overweight Neck-No JVD; no carotid bruits Lungs-clear lung fields; resonant to percussion; mild kyphosis Cardiovascular-normal PMI; Distant S1 and S2; irregular rhythm Abdomen-normal bowel sounds; soft  and non-tender without masses or organomegaly Musculoskeletal-No deformities, no cyanosis or clubbing Neurologic-Normal cranial nerves; symmetric strength and tone Skin-Warm, no significant lesions Extremities-distal pulses intact; 1/2+ ankle and pretibial edema  Rhythm Strip: Atrial fibrillation with slow ventricular response; LAFB vs prior inferior MI; prior anterior myocardial infarction  ASSESSMENT AND PLAN:  [place] Bing, MD 05/10/2012 2:03 PM

## 2012-05-10 NOTE — Progress Notes (Deleted)
Name: [name]    DOB: February 20, 1931  Age: 78 y.o.  MR#: [id number]       PCP:  Lilyan Punt, MD      Insurance: @PAYORNAME @    MEDICATION BOTTLES REVIEWED   CC:    Chief Complaint  Patient presents with  . Follow-up    VS BP 122/74  Pulse 71  Ht 6' (1.829 m)  Wt 273 lb 12.8 oz (124.195 kg)  BMI 37.13 kg/m2  SpO2 95%  Weights Current Weight  05/10/12 273 lb 12.8 oz (124.195 kg)  05/08/11 284 lb 1.9 oz (128.876 kg)  09/04/10 276 lb (125.193 kg)    Blood Pressure  BP Readings from Last 3 Encounters:  05/10/12 122/74  05/08/11 122/72  09/04/10 143/81     Admit date:  (Not on file) Last encounter with [facility]:  12/22/2011   Allergy Allergies  Allergen Reactions  . Ibuprofen     REACTION: skin rash    Current Outpatient Prescriptions  Medication Sig Dispense Refill  . amLODipine-benazepril (LOTREL) 10-40 MG per capsule Take 1 capsule by mouth daily.  90 capsule  3  . COUMADIN 5 MG tablet TAKE 1 TABLET DAILY AS DIRECTED.  60 each  6  . furosemide (LASIX) 40 MG tablet Take 1 tablet (40 mg total) by mouth daily.  90 tablet  3  . lisinopril (PRINIVIL) 10 MG tablet Take 1 tablet (10 mg total) by mouth daily.  30 tablet  6  . metoprolol (LOPRESSOR) 100 MG tablet TAKE (1) TABLET TWICE DAILY.  60 tablet  6  . [DISCONTINUED] metoprolol (LOPRESSOR) 100 MG tablet Take 1 tablet (100 mg total) by mouth 2 (two) times daily.  60 tablet  6    Discontinued Meds:    Medications Discontinued During This Encounter  Medication Reason  . metoprolol (LOPRESSOR) 100 MG tablet Error    Patient Active Problem List  Diagnosis  . HYPERLIPIDEMIA  . Overweight  . HYPERTENSION  . NEPHROLITHIASIS  . Atrial fibrillation  . Chronic anticoagulation    LABS Anti-coag visit on 04/26/2012  Component Date Value  . INR 04/26/2012 2.9   Anti-coag visit on 03/22/2012  Component Date Value  . INR 03/22/2012 2.5   Anti-coag visit on 02/09/2012  Component Date Value  . INR 02/09/2012 2.2      Results for this Opt Visit:     Results for orders placed in visit on 04/26/12  POCT INR      Component Value Range   INR 2.9      EKG Orders placed in visit on 07/11/10  . CONVERTED CEMR EKG     Prior Assessment and Plan Problem List as of 05/10/2012            Cardiology Problems   HYPERLIPIDEMIA   Last Assessment & Plan Note   05/08/2011 Office Visit Signed 05/08/2011  4:09 PM by Kathlen Brunswick, MD    Atorvastatin will be started at a dose of 40 mg per day with a repeat lipid profile in one month.    HYPERTENSION   Last Assessment & Plan Note   05/08/2011 Office Visit Signed 05/08/2011  4:09 PM by Kathlen Brunswick, MD    Blood pressure control is excellent; current medications will be continued.    Atrial fibrillation   Last Assessment & Plan Note   05/08/2011 Office Visit Signed 05/08/2011  3:55 PM by Kathlen Brunswick, MD    Patient is asymptomatic with good control of heart rate.  Current medications will be continued.      Other   Overweight   Last Assessment & Plan Note   05/08/2011 Office Visit Signed 05/08/2011  4:10 PM by Kathlen Brunswick, MD    Weight loss advised and dietary strategies as well as advisability of increased exercise discussed.    NEPHROLITHIASIS   Chronic anticoagulation   Last Assessment & Plan Note   05/08/2011 Office Visit Signed 05/08/2011  4:11 PM by Kathlen Brunswick, MD    CBC and stool for Hemoccult testing will be obtained to exclude occult GI blood loss.        Imaging: No results found.   FRS Calculation: Score not calculated. Missing: Total Cholesterol

## 2012-05-11 LAB — COMPREHENSIVE METABOLIC PANEL
Alkaline Phosphatase: 76 U/L (ref 39–117)
Creat: 1.03 mg/dL (ref 0.50–1.35)
Glucose, Bld: 93 mg/dL (ref 70–99)
Sodium: 142 mEq/L (ref 135–145)
Total Bilirubin: 0.8 mg/dL (ref 0.3–1.2)
Total Protein: 6.9 g/dL (ref 6.0–8.3)

## 2012-05-11 LAB — CBC
Hemoglobin: 14.8 g/dL (ref 13.0–17.0)
MCH: 32.6 pg (ref 26.0–34.0)
MCHC: 34.2 g/dL (ref 30.0–36.0)
MCV: 95.4 fL (ref 78.0–100.0)
Platelets: 225 10*3/uL (ref 150–400)

## 2012-05-11 MED ORDER — METOPROLOL TARTRATE 50 MG PO TABS: 50.0000 mg | ORAL_TABLET | Freq: Two times a day (BID) | ORAL | Status: DC

## 2012-05-26 ENCOUNTER — Other Ambulatory Visit: Payer: Self-pay | Admitting: Cardiology

## 2012-05-26 NOTE — Telephone Encounter (Signed)
rx sent to pharmacy by e-script  

## 2012-06-03 ENCOUNTER — Ambulatory Visit (INDEPENDENT_AMBULATORY_CARE_PROVIDER_SITE_OTHER): Payer: Medicare Other | Admitting: *Deleted

## 2012-06-03 DIAGNOSIS — I4891 Unspecified atrial fibrillation: Secondary | ICD-10-CM

## 2012-06-03 DIAGNOSIS — Z7901 Long term (current) use of anticoagulants: Secondary | ICD-10-CM

## 2012-06-21 ENCOUNTER — Other Ambulatory Visit: Payer: Self-pay | Admitting: Cardiology

## 2012-06-21 MED ORDER — WARFARIN SODIUM 5 MG PO TABS: 5.0000 mg | ORAL_TABLET | Freq: Every day | ORAL | Status: DC

## 2012-06-21 NOTE — Telephone Encounter (Signed)
rx sent to pharmacy by e-script  

## 2012-06-23 ENCOUNTER — Other Ambulatory Visit: Payer: Self-pay | Admitting: Cardiology

## 2012-06-24 ENCOUNTER — Other Ambulatory Visit: Payer: Self-pay | Admitting: *Deleted

## 2012-06-24 MED ORDER — AMLODIPINE BESY-BENAZEPRIL HCL 10-40 MG PO CAPS: 1.0000 | ORAL_CAPSULE | Freq: Every day | ORAL | Status: DC

## 2012-06-24 NOTE — Telephone Encounter (Signed)
Fax Received. Refill Completed. Teejay Meader Chowoe (R.M.A)   

## 2012-07-15 ENCOUNTER — Ambulatory Visit (INDEPENDENT_AMBULATORY_CARE_PROVIDER_SITE_OTHER): Payer: Medicare Other | Admitting: *Deleted

## 2012-07-15 DIAGNOSIS — Z7901 Long term (current) use of anticoagulants: Secondary | ICD-10-CM

## 2012-07-15 DIAGNOSIS — I4891 Unspecified atrial fibrillation: Secondary | ICD-10-CM

## 2012-07-15 LAB — POCT INR: INR: 2.8

## 2012-07-23 ENCOUNTER — Other Ambulatory Visit: Payer: Self-pay | Admitting: Cardiology

## 2012-07-27 ENCOUNTER — Other Ambulatory Visit: Payer: Self-pay | Admitting: *Deleted

## 2012-07-27 ENCOUNTER — Telehealth: Payer: Self-pay | Admitting: *Deleted

## 2012-07-27 MED ORDER — LISINOPRIL 40 MG PO TABS: 40.0000 mg | ORAL_TABLET | Freq: Every day | ORAL | Status: DC

## 2012-07-27 NOTE — Telephone Encounter (Signed)
All scripts to be sent to [facility]

## 2012-07-27 NOTE — Telephone Encounter (Signed)
Message left to clarify current pharmacy information.

## 2012-08-26 ENCOUNTER — Ambulatory Visit (INDEPENDENT_AMBULATORY_CARE_PROVIDER_SITE_OTHER): Payer: Medicare Other | Admitting: *Deleted

## 2012-08-26 DIAGNOSIS — I4891 Unspecified atrial fibrillation: Secondary | ICD-10-CM

## 2012-08-26 DIAGNOSIS — Z7901 Long term (current) use of anticoagulants: Secondary | ICD-10-CM

## 2012-10-07 ENCOUNTER — Ambulatory Visit (INDEPENDENT_AMBULATORY_CARE_PROVIDER_SITE_OTHER): Payer: 59 | Admitting: *Deleted

## 2012-10-07 DIAGNOSIS — I4891 Unspecified atrial fibrillation: Secondary | ICD-10-CM

## 2012-10-07 DIAGNOSIS — Z7901 Long term (current) use of anticoagulants: Secondary | ICD-10-CM

## 2012-10-07 LAB — POCT INR: INR: 2.3

## 2012-11-18 ENCOUNTER — Ambulatory Visit (INDEPENDENT_AMBULATORY_CARE_PROVIDER_SITE_OTHER): Payer: 59 | Admitting: *Deleted

## 2012-11-18 DIAGNOSIS — Z7901 Long term (current) use of anticoagulants: Secondary | ICD-10-CM

## 2012-11-18 DIAGNOSIS — I4891 Unspecified atrial fibrillation: Secondary | ICD-10-CM

## 2012-11-18 LAB — POCT INR: INR: 2.9

## 2012-11-23 ENCOUNTER — Telehealth: Payer: Self-pay | Admitting: Cardiology

## 2012-11-23 MED ORDER — AMLODIPINE BESY-BENAZEPRIL HCL 10-40 MG PO CAPS: 1.0000 | ORAL_CAPSULE | Freq: Every day | ORAL | Status: DC

## 2012-11-23 MED ORDER — METOPROLOL TARTRATE 50 MG PO TABS: 50.0000 mg | ORAL_TABLET | Freq: Two times a day (BID) | ORAL | Status: DC

## 2012-11-23 NOTE — Telephone Encounter (Signed)
rx sent to pharmacy by e-script  

## 2012-11-23 NOTE — Telephone Encounter (Signed)
Needs RX for Amlodopine sent to [facility] pharmacy. Also needs RX for Metoprolol 50mg  sent to [facility]. / tgs

## 2012-12-30 ENCOUNTER — Ambulatory Visit (INDEPENDENT_AMBULATORY_CARE_PROVIDER_SITE_OTHER): Payer: 59 | Admitting: *Deleted

## 2012-12-30 DIAGNOSIS — Z7901 Long term (current) use of anticoagulants: Secondary | ICD-10-CM

## 2012-12-30 DIAGNOSIS — I4891 Unspecified atrial fibrillation: Secondary | ICD-10-CM

## 2012-12-30 LAB — POCT INR: INR: 2.9

## 2013-01-03 ENCOUNTER — Encounter: Payer: Self-pay | Admitting: *Deleted

## 2013-02-03 ENCOUNTER — Telehealth: Payer: Self-pay | Admitting: *Deleted

## 2013-02-03 MED ORDER — LISINOPRIL 40 MG PO TABS: 40.0000 mg | ORAL_TABLET | Freq: Every day | ORAL | Status: DC

## 2013-02-03 MED ORDER — FUROSEMIDE 40 MG PO TABS: ORAL_TABLET | ORAL | Status: DC

## 2013-02-03 NOTE — Telephone Encounter (Signed)
PT NEEDS LISINOPRIL AND FUROSEMIDE CALLED IN TO [facility]

## 2013-02-10 ENCOUNTER — Ambulatory Visit (INDEPENDENT_AMBULATORY_CARE_PROVIDER_SITE_OTHER): Payer: 59 | Admitting: *Deleted

## 2013-02-10 DIAGNOSIS — Z7901 Long term (current) use of anticoagulants: Secondary | ICD-10-CM

## 2013-02-10 DIAGNOSIS — I4891 Unspecified atrial fibrillation: Secondary | ICD-10-CM

## 2013-03-10 ENCOUNTER — Ambulatory Visit (INDEPENDENT_AMBULATORY_CARE_PROVIDER_SITE_OTHER): Payer: 59 | Admitting: *Deleted

## 2013-03-10 DIAGNOSIS — I4891 Unspecified atrial fibrillation: Secondary | ICD-10-CM

## 2013-03-10 DIAGNOSIS — Z7901 Long term (current) use of anticoagulants: Secondary | ICD-10-CM

## 2013-03-23 ENCOUNTER — Ambulatory Visit (INDEPENDENT_AMBULATORY_CARE_PROVIDER_SITE_OTHER): Payer: Medicare Other | Admitting: Family Medicine

## 2013-03-23 ENCOUNTER — Encounter: Payer: Self-pay | Admitting: Cardiology

## 2013-03-23 ENCOUNTER — Encounter: Payer: Self-pay | Admitting: Family Medicine

## 2013-03-23 VITALS — BP 110/70 | Ht 68.5 in | Wt 278.0 lb

## 2013-03-23 DIAGNOSIS — Z23 Encounter for immunization: Secondary | ICD-10-CM

## 2013-03-23 DIAGNOSIS — Z0000 Encounter for general adult medical examination without abnormal findings: Secondary | ICD-10-CM

## 2013-03-23 NOTE — Progress Notes (Signed)
  Subjective:    Patient ID: [name], male    DOB: [birth year], 79 y.o.   MRN: [id number]  HPI AWV- Annual Wellness Visit  The patient was seen for their annual wellness visit. The patient's past medical history, surgical history, and family history were reviewed. Pertinent vaccines were reviewed ( tetanus, pneumonia, shingles, flu) The patient's medication list was reviewed and updated. The height and weight were entered. The patient's current BMI is:41.65  Cognitive screening was completed. Outcome of Mini - WUJ:WJXB   Falls within the past 6 months:NO Current tobacco usage:Never smoked (All patients who use tobacco were given written and verbal information on quitting)  Recent listing of emergency department/hospitalizations over the past year were reviewed.  current specialist the patient sees on a regular basis: [facility]  Medicare annual wellness visit patient questionnaire was reviewed.  A written screening schedule for the patient for the next 5-10 years was given. Appropriate discussion of followup regarding next visit was discussed.        Review of Systems  Constitutional: Negative for fever, activity change and appetite change.  HENT: Negative for congestion, rhinorrhea and neck pain.   Eyes: Negative for discharge.  Respiratory: Negative for cough and wheezing.   Cardiovascular: Negative for chest pain.  Gastrointestinal: Negative for vomiting, abdominal pain and blood in stool.  Genitourinary: Negative for frequency and difficulty urinating.  Skin: Negative for rash.  Allergic/Immunologic: Negative for environmental allergies and food allergies.  Neurological: Negative for weakness and headaches.  Psychiatric/Behavioral: Negative for agitation.       Objective:   Physical Exam  Constitutional: He appears well-developed and well-nourished.  HENT:  Head: Normocephalic and atraumatic.  Right Ear: External ear normal.  Left Ear: External ear  normal.  Nose: Nose normal.  Mouth/Throat: Oropharynx is clear and moist.  Eyes: EOM are normal. Pupils are equal, round, and reactive to light.  Neck: Normal range of motion. Neck supple. No thyromegaly present.  Cardiovascular: Normal rate, regular rhythm and normal heart sounds.   No murmur heard. Pulmonary/Chest: Effort normal and breath sounds normal. No respiratory distress. He has no wheezes.  Abdominal: Soft. Bowel sounds are normal. He exhibits no distension and no mass. There is no tenderness.  Genitourinary: Prostate normal and penis normal.  Musculoskeletal: Normal range of motion. He exhibits no edema.  Lymphadenopathy:    He has no cervical adenopathy.  Neurological: He is alert. He exhibits normal muscle tone.  Skin: Skin is warm and dry. No erythema.  Psychiatric: He has a normal mood and affect. His behavior is normal. Judgment normal.          Assessment & Plan:  Patient does suffer with obesity he does try to watch how he eats he also tries to do a better job of increasing activity. Patient is aware that he is up-to-date on colonoscopy will need one again in the future possibly in 2013 but at that point in time his age might indicate that he doesn't have to do a Patient is up-to-date on immunizations. Flu shot was given today. Safety measures including driving dietary exercise all discussed patient should have annual wellness visit again in one years time

## 2013-04-07 ENCOUNTER — Ambulatory Visit (INDEPENDENT_AMBULATORY_CARE_PROVIDER_SITE_OTHER): Payer: 59 | Admitting: *Deleted

## 2013-04-07 DIAGNOSIS — Z7901 Long term (current) use of anticoagulants: Secondary | ICD-10-CM

## 2013-04-07 DIAGNOSIS — I4891 Unspecified atrial fibrillation: Secondary | ICD-10-CM

## 2013-04-07 LAB — POCT INR: INR: 3.2

## 2013-05-05 ENCOUNTER — Ambulatory Visit (INDEPENDENT_AMBULATORY_CARE_PROVIDER_SITE_OTHER): Payer: 59 | Admitting: *Deleted

## 2013-05-05 ENCOUNTER — Encounter: Payer: Self-pay | Admitting: Cardiology

## 2013-05-05 ENCOUNTER — Ambulatory Visit (INDEPENDENT_AMBULATORY_CARE_PROVIDER_SITE_OTHER): Payer: 59 | Admitting: Cardiology

## 2013-05-05 VITALS — BP 118/68 | HR 68 | Ht 72.0 in | Wt 272.0 lb

## 2013-05-05 DIAGNOSIS — Z7901 Long term (current) use of anticoagulants: Secondary | ICD-10-CM

## 2013-05-05 DIAGNOSIS — I4891 Unspecified atrial fibrillation: Secondary | ICD-10-CM

## 2013-05-05 DIAGNOSIS — E782 Mixed hyperlipidemia: Secondary | ICD-10-CM

## 2013-05-05 DIAGNOSIS — I10 Essential (primary) hypertension: Secondary | ICD-10-CM

## 2013-05-05 DIAGNOSIS — I25810 Atherosclerosis of coronary artery bypass graft(s) without angina pectoris: Secondary | ICD-10-CM

## 2013-05-05 MED ORDER — METOPROLOL SUCCINATE ER 50 MG PO TB24: 50.0000 mg | ORAL_TABLET | Freq: Every day | ORAL | Status: DC

## 2013-05-05 MED ORDER — ATORVASTATIN CALCIUM 40 MG PO TABS: 40.0000 mg | ORAL_TABLET | Freq: Every day | ORAL | Status: DC

## 2013-05-05 NOTE — Patient Instructions (Addendum)
Your physician recommends that you schedule a follow-up appointment in: MONTH  Your physician has recommended you make the following change in your medication:   1) STOP TAKING LISINOPRIL 2) STOP TAKING LOPRESSOR  3) START TAKING TOPROL XL 50MG  ONCE DAILY 4) START TAKING ASPIRIN 81MG  ONCE DAILY (OTC) 5) START LIPITOR 40MG  ONCE DAILY   Your physician has requested that you have an echocardiogram. Echocardiography is a painless test that uses sound waves to create images of your heart. It provides your doctor with information about the size and shape of your heart and how well your heart's chambers and valves are working. This procedure takes approximately one hour. There are no restrictions for this procedure.  WE WILL CALL YOU WITH YOUR TEST RESULTS/INSTRUCTIONS/NEXT STEPS ONCE RECEIVED BY THE PROVIDER  PLEASE BE ADVISED YOU WILL STILL NEED TO KEEP YOUR FOLLOW UP APPOINTMENT TO DISCUSS FURTHER DETAILS OF YOUR TEST RESULTS/NEXT STEPS/FUTURE PLAN OF CARE WITH YOUR PROVIDER DESPITE THE FACT THAT YOU MAY HAVE NORMAL TEST RESULTS   Your physician recommends that you return for lab work in: THIS WEEK (SLIPS GIVEN FOR FASTING LIPID PANEL, CMET)

## 2013-05-05 NOTE — Progress Notes (Addendum)
Clinical Summary [name] is a 79 y.o.male former patient of Dr Dietrich Pates, seen today for follow up of the following problems.  1. CAD/ICM - prior CABG in Nov 2006 at [facility], LVEF mild to moderately decreased by echo July 2011( no percentage given)  - no chest pain. Mild DOE at 6 blocks,which is stable. No orthopnea, no PND, no LE edema. - compliant with meds: benazepril, metoprolol (lopressor)   2. Afib  - no palpitations. Denies any lightheadedness or dizziness - compliant with coumadin, denies any bleeding issues  3. HTN - does not check at home - compliant with meds  4. HL - was taken off therapy previously because of normal panel, not currently on statin  Past Medical History  Diagnosis Date  . ASCVD (arteriosclerotic cardiovascular disease)     CABG 11/06; MUGA 11/08 - 44%; 3/07 mildly decreased EF on echo  . Atrial fibrillation     Initially occurred following CABG; event recorder in 11/08 - no AF; recurred 08/2009  . Anticoagulant disorder   . Hypertension   . Hyperlipidemia   . Overweight(278.02)   . Nephrolithiasis   . CHF (congestive heart failure)      Allergies  Allergen Reactions  . Ibuprofen     REACTION: skin rash     Current Outpatient Prescriptions  Medication Sig Dispense Refill  . amLODipine-benazepril (LOTREL) 10-40 MG per capsule Take 1 capsule by mouth daily.  90 capsule  1  . furosemide (LASIX) 40 MG tablet TAKE 1 TABLET DAILY  90 tablet  2  . lisinopril (PRINIVIL,ZESTRIL) 40 MG tablet Take 1 tablet (40 mg total) by mouth daily.  90 tablet  2  . metoprolol (LOPRESSOR) 50 MG tablet Take 1 tablet (50 mg total) by mouth 2 (two) times daily.  180 tablet  1  . warfarin (COUMADIN) 5 MG tablet Take 1 tablet (5 mg total) by mouth daily.  30 tablet  3  . amLODipine-atorvastatin (CADUET) 10-40 MG per tablet TAKE 1 TABLET DAILY  90 tablet  2   No current facility-administered medications for this visit.     Past Surgical History    Procedure Laterality Date  . Coronary artery bypass graft  04/2005  . Colonoscopy  2008  . Angioplasty  1991     Allergies  Allergen Reactions  . Ibuprofen     REACTION: skin rash      Family History  Problem Relation Age of Onset  . Hypertension Father   . Heart attack Father   . Diabetes Brother      Social History [name] reports that he has never smoked. He does not have any smokeless tobacco history on file. [name] reports that he does not drink alcohol.   Review of Systems CONSTITUTIONAL: No weight loss, fever, chills, weakness or fatigue.  HEENT: Eyes: No visual loss, blurred vision, double vision or yellow sclerae.No hearing loss, sneezing, congestion, runny nose or sore throat.  SKIN: No rash or itching.  CARDIOVASCULAR: per HPI RESPIRATORY: per HPI  GASTROINTESTINAL: No anorexia, nausea, vomiting or diarrhea. No abdominal pain or blood.  GENITOURINARY: No burning on urination, no polyuria NEUROLOGICAL: No headache, dizziness, syncope, paralysis, ataxia, numbness or tingling in the extremities. No change in bowel or bladder control.  MUSCULOSKELETAL: No muscle, back pain, joint pain or stiffness.  LYMPHATICS: No enlarged nodes. No history of splenectomy.  PSYCHIATRIC: No history of depression or anxiety.  ENDOCRINOLOGIC: No reports of sweating, cold or heat intolerance. No  polyuria or polydipsia.  [name]   Physical Examination Filed Vitals:   05/05/13 0842  BP: 118/68  Pulse: 68   Filed Weights   05/05/13 0842  Weight: 272 lb (123.378 kg)    Gen: resting comfortably, no acute distress HEENT: no scleral icterus, pupils equal round and reactive, no palptable cervical adenopathy,  CV: RRR, no m/r/g, no JVD, no carotid bruits Resp: Clear to auscultation bilaterally GI: abdomen is soft, non-tender, non-distended, normal bowel sounds, no hepatosplenomegaly MSK: extremities are warm, no edema.  Skin: warm, no rash Neuro:  no focal deficits Psych:  appropriate affect   Diagnostic Studies July 2011 Echo: mild to moderately decreased LVEF (no LVEF given), apex akinetic, severe LVH, severe LAE   05/05/13 Clinic EKG: afib at 58 bpm, LAD, anteroseptal and anterolateral Q waves, inferior Q waves  Assessment and Plan  1. CAD/ICM - denies any current symptoms - his regimen is not optimal at this time. Will stop his lopressor and start him on Toprol XL in the setting of systolic dysfunction - will stop lisinopril as he is already on benazepril - repeat echo to reevaluate LV systolic function - start ASA 81 mg daily for secondary prevention.   2. Afib - denies any current symptoms - continue rate control strategy and anticoagulation  3. HTN - at goal today, continue to follow clinically  4. Hyperlipidemia - was taken off statin previously due to normal lipid panel - given history of CAD, will restart statin. Will start lipitor 40mg  daily and see how he tolerates it, if tolerates consider increasing to 80mg  daily consistent with most recent guidelines for lipid management in patients with known cardiovascular disease.  - repeat CMET and lipid panel  F/u 1 month  Antoine Poche, M.D., F.A.C.C.

## 2013-05-06 ENCOUNTER — Ambulatory Visit: Payer: 59 | Admitting: Cardiology

## 2013-05-10 ENCOUNTER — Ambulatory Visit: Payer: 59 | Admitting: Cardiology

## 2013-05-12 LAB — LIPID PANEL
HDL: 47 mg/dL (ref >39)
LDL Cholesterol: 122 mg/dL — ABNORMAL HIGH (ref 0–99)
Total CHOL/HDL Ratio: 3.9 Ratio
Triglycerides: 81 mg/dL (ref <150)
VLDL: 16 mg/dL (ref 0–40)

## 2013-05-12 LAB — COMPREHENSIVE METABOLIC PANEL
ALT: 12 U/L (ref 0–53)
Alkaline Phosphatase: 69 U/L (ref 39–117)
CO2: 23 mEq/L (ref 19–32)
Creat: 1.06 mg/dL (ref 0.50–1.35)
Glucose, Bld: 114 mg/dL — ABNORMAL HIGH (ref 70–99)
Sodium: 141 mEq/L (ref 135–145)
Total Bilirubin: 0.8 mg/dL (ref 0.3–1.2)
Total Protein: 7 g/dL (ref 6.0–8.3)

## 2013-05-13 ENCOUNTER — Encounter: Payer: Self-pay | Admitting: *Deleted

## 2013-05-13 ENCOUNTER — Ambulatory Visit (HOSPITAL_COMMUNITY)
Admission: RE | Admit: 2013-05-13 | Discharge: 2013-05-13 | Disposition: A | Discharge status: Home / Self Care | Payer: Medicare Other | Source: Ambulatory Visit | Attending: Cardiology | Admitting: Cardiology

## 2013-05-13 DIAGNOSIS — I4891 Unspecified atrial fibrillation: Secondary | ICD-10-CM

## 2013-05-13 DIAGNOSIS — I10 Essential (primary) hypertension: Secondary | ICD-10-CM

## 2013-05-13 DIAGNOSIS — I517 Cardiomegaly: Secondary | ICD-10-CM

## 2013-05-13 DIAGNOSIS — Z6836 Body mass index (BMI) 36.0-36.9, adult: Secondary | ICD-10-CM | POA: Insufficient documentation

## 2013-05-13 DIAGNOSIS — Z87891 Personal history of nicotine dependence: Secondary | ICD-10-CM | POA: Insufficient documentation

## 2013-05-13 DIAGNOSIS — I2510 Atherosclerotic heart disease of native coronary artery without angina pectoris: Secondary | ICD-10-CM | POA: Insufficient documentation

## 2013-05-13 DIAGNOSIS — I25810 Atherosclerosis of coronary artery bypass graft(s) without angina pectoris: Secondary | ICD-10-CM

## 2013-05-13 DIAGNOSIS — E785 Hyperlipidemia, unspecified: Secondary | ICD-10-CM | POA: Insufficient documentation

## 2013-05-13 DIAGNOSIS — E669 Obesity, unspecified: Secondary | ICD-10-CM | POA: Insufficient documentation

## 2013-05-13 DIAGNOSIS — E782 Mixed hyperlipidemia: Secondary | ICD-10-CM

## 2013-05-13 NOTE — Progress Notes (Signed)
*  PRELIMINARY RESULTS* Echocardiogram 2D Echocardiogram has been performed.  Genny Caulder 05/13/2013, 1:31 PM

## 2013-05-18 ENCOUNTER — Other Ambulatory Visit: Payer: Self-pay | Admitting: *Deleted

## 2013-05-18 DIAGNOSIS — R079 Chest pain, unspecified: Secondary | ICD-10-CM

## 2013-05-31 ENCOUNTER — Telehealth: Payer: Self-pay | Admitting: *Deleted

## 2013-05-31 MED ORDER — AMLODIPINE BESY-BENAZEPRIL HCL 10-40 MG PO CAPS: 1.0000 | ORAL_CAPSULE | Freq: Every day | ORAL | Status: DC

## 2013-05-31 NOTE — Telephone Encounter (Signed)
Pt walked in needing a refill amlodipine 10-40mg 

## 2013-05-31 NOTE — Telephone Encounter (Signed)
rx refill completed

## 2013-06-01 ENCOUNTER — Ambulatory Visit (HOSPITAL_COMMUNITY)
Admission: RE | Admit: 2013-06-01 | Discharge: 2013-06-01 | Disposition: A | Discharge status: Home / Self Care | Payer: Medicare Other | Source: Ambulatory Visit | Attending: Cardiology | Admitting: Cardiology

## 2013-06-01 DIAGNOSIS — I4891 Unspecified atrial fibrillation: Secondary | ICD-10-CM | POA: Insufficient documentation

## 2013-06-01 DIAGNOSIS — R079 Chest pain, unspecified: Secondary | ICD-10-CM | POA: Insufficient documentation

## 2013-06-01 DIAGNOSIS — I509 Heart failure, unspecified: Secondary | ICD-10-CM | POA: Insufficient documentation

## 2013-06-01 DIAGNOSIS — R072 Precordial pain: Secondary | ICD-10-CM

## 2013-06-01 DIAGNOSIS — I10 Essential (primary) hypertension: Secondary | ICD-10-CM | POA: Insufficient documentation

## 2013-06-01 DIAGNOSIS — R0609 Other forms of dyspnea: Secondary | ICD-10-CM | POA: Insufficient documentation

## 2013-06-01 DIAGNOSIS — Z6837 Body mass index (BMI) 37.0-37.9, adult: Secondary | ICD-10-CM | POA: Insufficient documentation

## 2013-06-01 DIAGNOSIS — I2510 Atherosclerotic heart disease of native coronary artery without angina pectoris: Secondary | ICD-10-CM | POA: Insufficient documentation

## 2013-06-01 DIAGNOSIS — E785 Hyperlipidemia, unspecified: Secondary | ICD-10-CM | POA: Insufficient documentation

## 2013-06-01 DIAGNOSIS — R0989 Other specified symptoms and signs involving the circulatory and respiratory systems: Secondary | ICD-10-CM | POA: Insufficient documentation

## 2013-06-01 NOTE — Progress Notes (Signed)
*  PRELIMINARY RESULTS* Echocardiogram 2D Echocardiogram with [facility] has been performed.  [name] 06/01/2013, 2:30 PM

## 2013-06-02 ENCOUNTER — Ambulatory Visit (INDEPENDENT_AMBULATORY_CARE_PROVIDER_SITE_OTHER): Payer: 59 | Admitting: *Deleted

## 2013-06-02 DIAGNOSIS — Z7901 Long term (current) use of anticoagulants: Secondary | ICD-10-CM

## 2013-06-02 DIAGNOSIS — I4891 Unspecified atrial fibrillation: Secondary | ICD-10-CM

## 2013-06-02 LAB — POCT INR: INR: 2.3

## 2013-06-17 ENCOUNTER — Ambulatory Visit: Payer: 59 | Admitting: Cardiology

## 2013-07-04 ENCOUNTER — Ambulatory Visit (INDEPENDENT_AMBULATORY_CARE_PROVIDER_SITE_OTHER): Payer: 59 | Admitting: *Deleted

## 2013-07-04 DIAGNOSIS — Z7901 Long term (current) use of anticoagulants: Secondary | ICD-10-CM

## 2013-07-04 DIAGNOSIS — I4891 Unspecified atrial fibrillation: Secondary | ICD-10-CM

## 2013-07-04 LAB — POCT INR: INR: 2

## 2013-07-21 ENCOUNTER — Ambulatory Visit (INDEPENDENT_AMBULATORY_CARE_PROVIDER_SITE_OTHER): Payer: 59 | Admitting: Cardiology

## 2013-07-21 ENCOUNTER — Encounter: Payer: Self-pay | Admitting: Cardiology

## 2013-07-21 VITALS — BP 134/65 | HR 57 | Ht 72.0 in | Wt 274.0 lb

## 2013-07-21 DIAGNOSIS — E785 Hyperlipidemia, unspecified: Secondary | ICD-10-CM

## 2013-07-21 DIAGNOSIS — I10 Essential (primary) hypertension: Secondary | ICD-10-CM

## 2013-07-21 DIAGNOSIS — I4891 Unspecified atrial fibrillation: Secondary | ICD-10-CM

## 2013-07-21 DIAGNOSIS — I2510 Atherosclerotic heart disease of native coronary artery without angina pectoris: Secondary | ICD-10-CM

## 2013-07-21 NOTE — Patient Instructions (Addendum)
Your physician wants you to follow-up in:  6 months. You will receive a reminder letter in the mail two months in advance. If you don't receive a letter, please call our office to schedule the follow-up appointment.   

## 2013-07-21 NOTE — Progress Notes (Signed)
Clinical Summary [name] is a 80 y.o.male seen today for follow up of the following medical problems.   1. CAD/ICM  - prior CABG in Nov 2006 at [facility], LVEF mild to moderately decreased by echo July 2011( no percentage given)  - no chest pain. Mild DOE at 6 blocks,which is stable. No orthopnea, no PND, no LE edema.  - compliant with meds  2. Afib  - no palpitations. Denies any lightheadedness or dizziness  - compliant with coumadin, denies any bleeding issues. Followed here for coumadin clinic.  - discussed new agents, still on coumadin  3. HTN  - does not check at home  - compliant with meds   4. HL  - restarted on statin last visit in 04/2013, denies any new side effects  Past Medical History  Diagnosis Date  . ASCVD (arteriosclerotic cardiovascular disease)     CABG 11/06; MUGA 11/08 - 44%; 3/07 mildly decreased EF on echo  . Atrial fibrillation     Initially occurred following CABG; event recorder in 11/08 - no AF; recurred 08/2009  . Anticoagulant disorder   . Hypertension   . Hyperlipidemia   . Overweight   . Nephrolithiasis   . CHF (congestive heart failure)      Allergies  Allergen Reactions  . Ibuprofen     REACTION: skin rash     Current Outpatient Prescriptions  Medication Sig Dispense Refill  . amLODipine-benazepril (LOTREL) 10-40 MG per capsule Take 1 capsule by mouth daily.  90 capsule  6  . atorvastatin (LIPITOR) 40 MG tablet Take 1 tablet (40 mg total) by mouth daily.  90 tablet  3  . furosemide (LASIX) 40 MG tablet TAKE 1 TABLET DAILY  90 tablet  2  . metoprolol succinate (TOPROL-XL) 50 MG 24 hr tablet Take 1 tablet (50 mg total) by mouth daily. Take with or immediately following a meal.  90 tablet  3  . warfarin (COUMADIN) 5 MG tablet Take 1 tablet (5 mg total) by mouth daily.  30 tablet  3   No current facility-administered medications for this visit.     Past Surgical History  Procedure Laterality Date  . Coronary artery bypass  graft  04/2005  . Colonoscopy  2008  . Angioplasty  1991     Allergies  Allergen Reactions  . Ibuprofen     REACTION: skin rash      Family History  Problem Relation Age of Onset  . Hypertension Father   . Heart attack Father   . Diabetes Brother      Social History [name] reports that he has never smoked. He does not have any smokeless tobacco history on file. [name] reports that he does not drink alcohol.   Review of Systems CONSTITUTIONAL: No weight loss, fever, chills, weakness or fatigue.  HEENT: Eyes: No visual loss, blurred vision, double vision or yellow sclerae.No hearing loss, sneezing, congestion, runny nose or sore throat.  SKIN: No rash or itching.  CARDIOVASCULAR: per HPI RESPIRATORY: No shortness of breath, cough or sputum.  GASTROINTESTINAL: No anorexia, nausea, vomiting or diarrhea. No abdominal pain or blood.  GENITOURINARY: No burning on urination, no polyuria NEUROLOGICAL: No headache, dizziness, syncope, paralysis, ataxia, numbness or tingling in the extremities. No change in bowel or bladder control.  MUSCULOSKELETAL: No muscle, back pain, joint pain or stiffness.  LYMPHATICS: No enlarged nodes. No history of splenectomy.  PSYCHIATRIC: No history of depression or anxiety.  ENDOCRINOLOGIC: No reports of  sweating, cold or heat intolerance. No polyuria or polydipsia.  [name].   Physical Examination p 57 bp 134/65 Wt 274 lbs BMI 37 Gen: resting comfortably, no acute distress HEENT: no scleral icterus, pupils equal round and reactive, no palptable cervical adenopathy,  CV: RRR, no m/r/g, no JVD, no carotid bruits Resp: Clear to auscultation bilaterally GI: abdomen is soft, non-tender, non-distended, normal bowel sounds, no hepatosplenomegaly MSK: extremities are warm, no edema.  Skin: warm, no rash Neuro:  no focal deficits Psych: appropriate affect   Diagnostic Studies July 2011 Echo: mild to moderately decreased LVEF (no LVEF given), apex  akinetic, severe LVH, severe LAE   05/05/13 Clinic EKG: afib at 58 bpm, LAD, anteroseptal and anterolateral Q waves, inferior Q waves  05/2013 Echo Technically difficult study, severe LVH, elevated LA pressure. Difficult to evalute LV function even with contrast, appears low normal to normal.     Assessment and Plan  1. CAD/ICM  - continue risk factor modification and secondary prevention - his echo has difficult windows but appears to have low normal function, will not aggresively titrate his meds as he has fairly normal function   2. Afib  - denies any current symptoms  - continue rate control strategy and anticoagulation   3. HTN  - at goal today, continue to follow clinically   4. Hyperlipidemia  - recently restarted statin, check lipid panel after next visit.   Follow up 6 months   Antoine PocheJonathan F. Printice Hellmer, M.D., F.A.C.C.

## 2013-08-01 ENCOUNTER — Ambulatory Visit (INDEPENDENT_AMBULATORY_CARE_PROVIDER_SITE_OTHER): Payer: 59 | Admitting: *Deleted

## 2013-08-01 DIAGNOSIS — I4891 Unspecified atrial fibrillation: Secondary | ICD-10-CM

## 2013-08-01 DIAGNOSIS — Z5181 Encounter for therapeutic drug level monitoring: Secondary | ICD-10-CM

## 2013-08-01 DIAGNOSIS — Z7901 Long term (current) use of anticoagulants: Secondary | ICD-10-CM

## 2013-08-01 LAB — POCT INR: INR: 2.4

## 2013-08-09 ENCOUNTER — Other Ambulatory Visit: Payer: Self-pay | Admitting: Cardiology

## 2013-08-09 ENCOUNTER — Telehealth: Payer: Self-pay | Admitting: Cardiology

## 2013-08-09 MED ORDER — WARFARIN SODIUM 5 MG PO TABS: ORAL_TABLET | ORAL | Status: DC

## 2013-08-09 NOTE — Telephone Encounter (Signed)
Received fax refill request  Rx # [id number] Medication:  Warfarin Sodium 5 mg tab Qty 30 Sig:  Take one tablet by mouth once daily Physician:  Dietrich Patesothbart  '

## 2013-08-29 ENCOUNTER — Other Ambulatory Visit: Payer: Self-pay | Admitting: Cardiology

## 2013-09-12 ENCOUNTER — Ambulatory Visit (INDEPENDENT_AMBULATORY_CARE_PROVIDER_SITE_OTHER): Payer: Medicare Other | Admitting: *Deleted

## 2013-09-12 DIAGNOSIS — I4891 Unspecified atrial fibrillation: Secondary | ICD-10-CM

## 2013-09-12 DIAGNOSIS — Z5181 Encounter for therapeutic drug level monitoring: Secondary | ICD-10-CM

## 2013-09-12 DIAGNOSIS — Z7901 Long term (current) use of anticoagulants: Secondary | ICD-10-CM

## 2013-09-12 LAB — POCT INR: INR: 2.1

## 2013-10-24 ENCOUNTER — Ambulatory Visit (INDEPENDENT_AMBULATORY_CARE_PROVIDER_SITE_OTHER): Payer: Medicare Other | Admitting: *Deleted

## 2013-10-24 DIAGNOSIS — Z5181 Encounter for therapeutic drug level monitoring: Secondary | ICD-10-CM

## 2013-10-24 DIAGNOSIS — I4891 Unspecified atrial fibrillation: Secondary | ICD-10-CM

## 2013-10-24 DIAGNOSIS — Z7901 Long term (current) use of anticoagulants: Secondary | ICD-10-CM

## 2013-10-24 LAB — POCT INR: INR: 2

## 2013-12-05 ENCOUNTER — Ambulatory Visit (INDEPENDENT_AMBULATORY_CARE_PROVIDER_SITE_OTHER): Payer: Medicare Other | Admitting: *Deleted

## 2013-12-05 DIAGNOSIS — Z5181 Encounter for therapeutic drug level monitoring: Secondary | ICD-10-CM

## 2013-12-05 DIAGNOSIS — Z7901 Long term (current) use of anticoagulants: Secondary | ICD-10-CM

## 2013-12-05 DIAGNOSIS — I4891 Unspecified atrial fibrillation: Secondary | ICD-10-CM

## 2013-12-05 LAB — POCT INR: INR: 1.8

## 2013-12-26 ENCOUNTER — Ambulatory Visit (INDEPENDENT_AMBULATORY_CARE_PROVIDER_SITE_OTHER): Payer: Medicare Other | Admitting: *Deleted

## 2013-12-26 DIAGNOSIS — I4891 Unspecified atrial fibrillation: Secondary | ICD-10-CM

## 2013-12-26 DIAGNOSIS — Z7901 Long term (current) use of anticoagulants: Secondary | ICD-10-CM

## 2013-12-26 DIAGNOSIS — Z5181 Encounter for therapeutic drug level monitoring: Secondary | ICD-10-CM

## 2013-12-26 LAB — POCT INR: INR: 2.6

## 2014-01-03 ENCOUNTER — Encounter: Payer: Self-pay | Admitting: Cardiology

## 2014-01-23 ENCOUNTER — Ambulatory Visit (INDEPENDENT_AMBULATORY_CARE_PROVIDER_SITE_OTHER): Payer: Medicare Other | Admitting: *Deleted

## 2014-01-23 DIAGNOSIS — I4891 Unspecified atrial fibrillation: Secondary | ICD-10-CM

## 2014-01-23 DIAGNOSIS — Z7901 Long term (current) use of anticoagulants: Secondary | ICD-10-CM

## 2014-01-23 DIAGNOSIS — Z5181 Encounter for therapeutic drug level monitoring: Secondary | ICD-10-CM

## 2014-01-23 LAB — POCT INR: INR: 2

## 2014-01-24 ENCOUNTER — Ambulatory Visit: Payer: Medicare Other | Admitting: Cardiology

## 2014-01-27 ENCOUNTER — Encounter: Payer: Self-pay | Admitting: Cardiology

## 2014-01-27 ENCOUNTER — Ambulatory Visit (INDEPENDENT_AMBULATORY_CARE_PROVIDER_SITE_OTHER): Payer: Medicare Other | Admitting: Cardiology

## 2014-01-27 VITALS — BP 136/74 | HR 70 | Ht 72.0 in | Wt 271.0 lb

## 2014-01-27 DIAGNOSIS — I4891 Unspecified atrial fibrillation: Secondary | ICD-10-CM

## 2014-01-27 DIAGNOSIS — I482 Chronic atrial fibrillation, unspecified: Secondary | ICD-10-CM

## 2014-01-27 DIAGNOSIS — I2510 Atherosclerotic heart disease of native coronary artery without angina pectoris: Secondary | ICD-10-CM

## 2014-01-27 DIAGNOSIS — I10 Essential (primary) hypertension: Secondary | ICD-10-CM

## 2014-01-27 DIAGNOSIS — E785 Hyperlipidemia, unspecified: Secondary | ICD-10-CM

## 2014-01-27 MED ORDER — FUROSEMIDE 20 MG PO TABS: ORAL_TABLET | ORAL | Status: DC

## 2014-01-27 NOTE — Progress Notes (Signed)
Clinical Summary Mr. Littie DeedsGentry is a [age] y.o.male seen today for follow up of the following medical problems.  1. CAD/ICM  - prior CABG in Nov 2006 at [facility]  - no chest pain. Mild DOE at 6 blocks,which is stable. No orthopnea, no PND, no LE edema.  - compliant with meds. Reports some occasional dizzines with standing.   2. Afib  - no palpitations. - compliant with coumadin, denies any bleeding issues. Followed here for coumadin clinic.    3. HTN  - does not check at home  - compliant with meds   4. HL  - restarted on statin last visit in 04/2013, denies any new side effects  - reports upcoming panel with pcp   Past Medical History  Diagnosis Date  . ASCVD (arteriosclerotic cardiovascular disease)     CABG 11/06; MUGA 11/08 - 44%; 3/07 mildly decreased EF on echo  . Atrial fibrillation     Initially occurred following CABG; event recorder in 11/08 - no AF; recurred 08/2009  . Anticoagulant disorder   . Hypertension   . Hyperlipidemia   . Overweight   . Nephrolithiasis   . CHF (congestive heart failure)      Allergies  Allergen Reactions  . Ibuprofen     REACTION: skin rash     Current Outpatient Prescriptions  Medication Sig Dispense Refill  . amLODipine-benazepril (LOTREL) 10-40 MG per capsule Take 1 capsule by mouth daily.  90 capsule  6  . aspirin EC 81 MG tablet Take 81 mg by mouth daily.      [name]. atorvastatin (LIPITOR) 40 MG tablet Take 1 tablet (40 mg total) by mouth daily.  90 tablet  3  . furosemide (LASIX) 40 MG tablet TAKE (1) TABLET BY MOUTH ONCE DAILY.  30 tablet  6  . metoprolol succinate (TOPROL-XL) 50 MG 24 hr tablet Take 1 tablet (50 mg total) by mouth daily. Take with or immediately following a meal.  90 tablet  3  . warfarin (COUMADIN) 5 MG tablet Take 1 tablet daily or as directed by coumadin clinic  30 tablet  3  . warfarin (COUMADIN) 5 MG tablet Take coumadin 1/2 tablet daily except 1 tablet on Sundays and Thursdays  30 tablet  6   No  current facility-administered medications for this visit.     Past Surgical History  Procedure Laterality Date  . Coronary artery bypass graft  04/2005  . Colonoscopy  2008  . Angioplasty  1991     Allergies  Allergen Reactions  . Ibuprofen     REACTION: skin rash      Family History  Problem Relation Age of Onset  . Hypertension Father   . Heart attack Father   . Diabetes Brother      Social History Mr. Littie DeedsGentry reports that he has never smoked. He does not have any smokeless tobacco history on file. Mr. Littie DeedsGentry reports that he does not drink alcohol.   Review of Systems CONSTITUTIONAL: No weight loss, fever, chills, weakness or fatigue.  HEENT: Eyes: No visual loss, blurred vision, double vision or yellow sclerae.No hearing loss, sneezing, congestion, runny nose or sore throat.  SKIN: No rash or itching.  CARDIOVASCULAR: per HPI RESPIRATORY: No shortness of breath, cough or sputum.  GASTROINTESTINAL: No anorexia, nausea, vomiting or diarrhea. No abdominal pain or blood.  GENITOURINARY: No burning on urination, no polyuria NEUROLOGICAL: No headache, dizziness, syncope, paralysis, ataxia, numbness or tingling in the extremities. No change in bowel  or bladder control.  MUSCULOSKELETAL: No muscle, back pain, joint pain or stiffness.  LYMPHATICS: No enlarged nodes. No history of splenectomy.  PSYCHIATRIC: No history of depression or anxiety.  ENDOCRINOLOGIC: No reports of sweating, cold or heat intolerance. No polyuria or polydipsia.  [name]   Physical Examination p 70 bp 136/74 Wt 271 lbs BMI 37 Gen: resting comfortably, no acute distress HEENT: no scleral icterus, pupils equal round and reactive, no palptable cervical adenopathy,  CV: RRR, no m/r/g, no JVD, no carotid bruits Resp: Clear to auscultation bilaterally GI: abdomen is soft, non-tender, non-distended, normal bowel sounds, no hepatosplenomegaly MSK: extremities are warm, no edema.  Skin: warm, no rash Neuro:   no focal deficits Psych: appropriate affect   Diagnostic Studies July 2011 Echo: mild to moderately decreased LVEF (no LVEF given), apex akinetic, severe LVH, severe LAE   05/05/13 Clinic EKG: afib at 58 bpm, LAD, anteroseptal and anterolateral Q waves, inferior Q waves   05/2013 Echo  Technically difficult study, severe LVH, elevated LA pressure. Difficult to evalute LV function even with contrast, appears low normal to normal.      Assessment and Plan  1. CAD - continue risk factor modification and secondary prevention  - his echo has difficult windows but appears to have low normal function, will not aggresively titrate his meds as he has fairly normal function   2. Afib  - denies any current symptoms  - continue rate control strategy and anticoagulation  - discussed NOACs, he prefers to stay on coumadin  3. HTN  - at goal today, continue to follow clinically   4. Hyperlipidemia  - reports repeat panel upcoming with pcp - given his hx of CAD, based on recent guidelines would consider changing to high dose staitn (atorva 80 or crestor 20).        Antoine Poche, M.D., F.A.C.C.

## 2014-01-27 NOTE — Patient Instructions (Signed)
Your physician recommends that you schedule a follow-up appointment in: 1 year with Dr Lurena JoinerBranch You will receive a reminder letter two months in advance reminding you to call and schedule your appointment. If you don't receive this letter, please contact our office.  Your physician has recommended you make the following change in your medication:  Take Lasix 20 mg daily

## 2014-02-20 ENCOUNTER — Ambulatory Visit (INDEPENDENT_AMBULATORY_CARE_PROVIDER_SITE_OTHER): Payer: Medicare Other | Admitting: *Deleted

## 2014-02-20 DIAGNOSIS — Z5181 Encounter for therapeutic drug level monitoring: Secondary | ICD-10-CM

## 2014-02-20 DIAGNOSIS — I4891 Unspecified atrial fibrillation: Secondary | ICD-10-CM

## 2014-02-20 DIAGNOSIS — Z7901 Long term (current) use of anticoagulants: Secondary | ICD-10-CM

## 2014-02-20 LAB — POCT INR: INR: 1.9

## 2014-03-07 ENCOUNTER — Telehealth: Payer: Self-pay | Admitting: Family Medicine

## 2014-03-07 DIAGNOSIS — E782 Mixed hyperlipidemia: Secondary | ICD-10-CM

## 2014-03-07 DIAGNOSIS — Z79899 Other long term (current) drug therapy: Secondary | ICD-10-CM

## 2014-03-07 NOTE — Telephone Encounter (Signed)
Pt needs bw orders for appt on 03/30/14  Please call when ready

## 2014-03-07 NOTE — Telephone Encounter (Signed)
Lipid, liver, metabolic 7 

## 2014-03-08 NOTE — Telephone Encounter (Signed)
Blood work ordered in EPIC. Patient notified. 

## 2014-03-13 ENCOUNTER — Other Ambulatory Visit: Payer: Self-pay | Admitting: Cardiology

## 2014-03-20 ENCOUNTER — Ambulatory Visit (INDEPENDENT_AMBULATORY_CARE_PROVIDER_SITE_OTHER): Payer: Medicare Other | Admitting: *Deleted

## 2014-03-20 DIAGNOSIS — Z7901 Long term (current) use of anticoagulants: Secondary | ICD-10-CM

## 2014-03-20 DIAGNOSIS — Z5181 Encounter for therapeutic drug level monitoring: Secondary | ICD-10-CM

## 2014-03-20 DIAGNOSIS — I4891 Unspecified atrial fibrillation: Secondary | ICD-10-CM

## 2014-03-20 LAB — HEPATIC FUNCTION PANEL
ALT: 13 U/L (ref 0–53)
AST: 17 U/L (ref 0–37)
Albumin: 4.1 g/dL (ref 3.5–5.2)
Alkaline Phosphatase: 85 U/L (ref 39–117)
BILIRUBIN DIRECT: 0.3 mg/dL (ref 0.0–0.3)
BILIRUBIN INDIRECT: 0.8 mg/dL (ref 0.2–1.2)
Total Bilirubin: 1.1 mg/dL (ref 0.2–1.2)
Total Protein: 7 g/dL (ref 6.0–8.3)

## 2014-03-20 LAB — BASIC METABOLIC PANEL
BUN: 17 mg/dL (ref 6–23)
CHLORIDE: 103 meq/L (ref 96–112)
CO2: 30 mEq/L (ref 19–32)
Calcium: 9.2 mg/dL (ref 8.4–10.5)
Creat: 1.1 mg/dL (ref 0.50–1.35)
GLUCOSE: 101 mg/dL — AB (ref 70–99)
POTASSIUM: 4.7 meq/L (ref 3.5–5.3)
Sodium: 138 mEq/L (ref 135–145)

## 2014-03-20 LAB — LIPID PANEL
CHOLESTEROL: 128 mg/dL (ref 0–200)
HDL: 49 mg/dL (ref >39)
LDL CALC: 63 mg/dL (ref 0–99)
Total CHOL/HDL Ratio: 2.6 Ratio
Triglycerides: 82 mg/dL (ref <150)
VLDL: 16 mg/dL (ref 0–40)

## 2014-03-20 LAB — POCT INR: INR: 2.7

## 2014-03-30 ENCOUNTER — Ambulatory Visit (INDEPENDENT_AMBULATORY_CARE_PROVIDER_SITE_OTHER): Payer: Medicare Other | Admitting: Family Medicine

## 2014-03-30 ENCOUNTER — Encounter: Payer: Self-pay | Admitting: Family Medicine

## 2014-03-30 VITALS — BP 128/72 | Ht 68.5 in | Wt 271.4 lb

## 2014-03-30 DIAGNOSIS — Z23 Encounter for immunization: Secondary | ICD-10-CM

## 2014-03-30 DIAGNOSIS — Z0001 Encounter for general adult medical examination with abnormal findings: Secondary | ICD-10-CM

## 2014-03-30 DIAGNOSIS — R6889 Other general symptoms and signs: Secondary | ICD-10-CM

## 2014-03-30 DIAGNOSIS — R739 Hyperglycemia, unspecified: Secondary | ICD-10-CM

## 2014-03-30 LAB — POCT GLYCOSYLATED HEMOGLOBIN (HGB A1C): Hemoglobin A1C: 5.8

## 2014-03-30 NOTE — Progress Notes (Signed)
   Subjective:    Patient ID: [name], male    DOB: 12-Jun-1931, 80 y.o.   MRN: [id number]  HPI AWV- Annual Wellness Visit  The patient was seen for their annual wellness visit. The patient's past medical history, surgical history, and family history were reviewed. Pertinent vaccines were reviewed ( tetanus, pneumonia, shingles, flu) The patient's medication list was reviewed and updated.  The height and weight were entered. The patient's current BMI is:40.6  Cognitive screening was completed. Outcome of Mini - Cog: pass  Falls within the past 6 months:stumbled yesterday and twisted left knee  Current tobacco usage: none (All patients who use tobacco were given written and verbal information on quitting)  Recent listing of emergency department/hospitalizations over the past year were reviewed.  current specialist the patient sees on a regular basis: yes   Medicare annual wellness visit patient questionnaire was reviewed.  A written screening schedule for the patient for the next 5-10 years was given. Appropriate discussion of followup regarding next visit was discussed.       Review of Systems Denies chest tightness pressure pain shortness of breath nausea vomiting    Objective:   Physical Exam  Ears normal throat normal neck supple lungs clear heart regular pulse normal abdomen soft no masses extremities no edema prostate exam normal      Assessment & Plan:  Wellness that overall this patient is doing good I did encourage him to eat healthy to a physically active try to reduce his weight. Immunizations updated today. Lab work overall looks good. If she should followup again in one years time for wellness and I told him if he has any sickness or illness, followup sooner. He does followup with cardiology on regular basis and if

## 2014-04-17 ENCOUNTER — Ambulatory Visit (INDEPENDENT_AMBULATORY_CARE_PROVIDER_SITE_OTHER): Payer: Medicare Other | Admitting: *Deleted

## 2014-04-17 DIAGNOSIS — I4891 Unspecified atrial fibrillation: Secondary | ICD-10-CM

## 2014-04-17 DIAGNOSIS — Z5181 Encounter for therapeutic drug level monitoring: Secondary | ICD-10-CM

## 2014-04-17 DIAGNOSIS — Z7901 Long term (current) use of anticoagulants: Secondary | ICD-10-CM

## 2014-04-17 LAB — POCT INR: INR: 3.8

## 2014-05-08 ENCOUNTER — Ambulatory Visit (INDEPENDENT_AMBULATORY_CARE_PROVIDER_SITE_OTHER): Payer: Medicare Other | Admitting: *Deleted

## 2014-05-08 DIAGNOSIS — Z7901 Long term (current) use of anticoagulants: Secondary | ICD-10-CM

## 2014-05-08 DIAGNOSIS — I4891 Unspecified atrial fibrillation: Secondary | ICD-10-CM

## 2014-05-08 DIAGNOSIS — Z5181 Encounter for therapeutic drug level monitoring: Secondary | ICD-10-CM

## 2014-05-08 LAB — POCT INR: INR: 2.6

## 2014-05-11 ENCOUNTER — Other Ambulatory Visit: Payer: Self-pay | Admitting: Cardiology

## 2014-05-22 ENCOUNTER — Other Ambulatory Visit: Payer: Self-pay | Admitting: Cardiology

## 2014-05-31 ENCOUNTER — Ambulatory Visit (INDEPENDENT_AMBULATORY_CARE_PROVIDER_SITE_OTHER): Payer: Medicare Other | Admitting: *Deleted

## 2014-05-31 DIAGNOSIS — Z5181 Encounter for therapeutic drug level monitoring: Secondary | ICD-10-CM

## 2014-05-31 DIAGNOSIS — I4891 Unspecified atrial fibrillation: Secondary | ICD-10-CM

## 2014-05-31 DIAGNOSIS — Z7901 Long term (current) use of anticoagulants: Secondary | ICD-10-CM

## 2014-05-31 LAB — POCT INR: INR: 3

## 2014-06-12 ENCOUNTER — Other Ambulatory Visit: Payer: Self-pay | Admitting: Cardiology

## 2014-07-05 ENCOUNTER — Ambulatory Visit (INDEPENDENT_AMBULATORY_CARE_PROVIDER_SITE_OTHER): Payer: Medicare Other | Admitting: *Deleted

## 2014-07-05 DIAGNOSIS — Z5181 Encounter for therapeutic drug level monitoring: Secondary | ICD-10-CM

## 2014-07-05 DIAGNOSIS — I4891 Unspecified atrial fibrillation: Secondary | ICD-10-CM

## 2014-07-05 DIAGNOSIS — Z7901 Long term (current) use of anticoagulants: Secondary | ICD-10-CM

## 2014-07-05 LAB — POCT INR: INR: 3.4

## 2014-07-07 ENCOUNTER — Ambulatory Visit (INDEPENDENT_AMBULATORY_CARE_PROVIDER_SITE_OTHER): Payer: Medicare Other | Admitting: Family Medicine

## 2014-07-07 ENCOUNTER — Encounter: Payer: Self-pay | Admitting: Family Medicine

## 2014-07-07 VITALS — BP 120/70 | Temp 98.8°F | Ht 68.5 in | Wt 269.0 lb

## 2014-07-07 DIAGNOSIS — B9689 Other specified bacterial agents as the cause of diseases classified elsewhere: Secondary | ICD-10-CM

## 2014-07-07 DIAGNOSIS — J019 Acute sinusitis, unspecified: Secondary | ICD-10-CM

## 2014-07-07 MED ORDER — AMOXICILLIN-POT CLAVULANATE 875-125 MG PO TABS: 1.0000 | ORAL_TABLET | Freq: Two times a day (BID) | ORAL | Status: DC

## 2014-07-07 NOTE — Patient Instructions (Signed)
Please get your INR checked in 7 to 10 days at the cardiology clinic   Sinusitis Sinusitis is redness, soreness, and inflammation of the paranasal sinuses. Paranasal sinuses are air pockets within the bones of your face (beneath the eyes, the middle of the forehead, or above the eyes). In healthy paranasal sinuses, mucus is able to drain out, and air is able to circulate through them by way of your nose. However, when your paranasal sinuses are inflamed, mucus and air can become trapped. This can allow bacteria and other germs to grow and cause infection. Sinusitis can develop quickly and last only a short time (acute) or continue over a long period (chronic). Sinusitis that lasts for more than 12 weeks is considered chronic.  CAUSES  Causes of sinusitis include:  Allergies.  Structural abnormalities, such as displacement of the cartilage that separates your nostrils (deviated septum), which can decrease the air flow through your nose and sinuses and affect sinus drainage.  Functional abnormalities, such as when the small hairs (cilia) that line your sinuses and help remove mucus do not work properly or are not present. SIGNS AND SYMPTOMS  Symptoms of acute and chronic sinusitis are the same. The primary symptoms are pain and pressure around the affected sinuses. Other symptoms include:  Upper toothache.  Earache.  Headache.  Bad breath.  Decreased sense of smell and taste.  A cough, which worsens when you are lying flat.  Fatigue.  Fever.  Thick drainage from your nose, which often is green and may contain pus (purulent).  Swelling and warmth over the affected sinuses. DIAGNOSIS  Your health care provider will perform a physical exam. During the exam, your health care provider may:  Look in your nose for signs of abnormal growths in your nostrils (nasal polyps).  Tap over the affected sinus to check for signs of infection.  View the inside of your sinuses (endoscopy) using  an imaging device that has a light attached (endoscope). If your health care provider suspects that you have chronic sinusitis, one or more of the following tests may be recommended:  Allergy tests.  Nasal culture. A sample of mucus is taken from your nose, sent to a lab, and screened for bacteria.  Nasal cytology. A sample of mucus is taken from your nose and examined by your health care provider to determine if your sinusitis is related to an allergy. TREATMENT  Most cases of acute sinusitis are related to a viral infection and will resolve on their own within 10 days. Sometimes medicines are prescribed to help relieve symptoms (pain medicine, decongestants, nasal steroid sprays, or saline sprays).  However, for sinusitis related to a bacterial infection, your health care provider will prescribe antibiotic medicines. These are medicines that will help kill the bacteria causing the infection.  Rarely, sinusitis is caused by a fungal infection. In theses cases, your health care provider will prescribe antifungal medicine. For some cases of chronic sinusitis, surgery is needed. Generally, these are cases in which sinusitis recurs more than 3 times per year, despite other treatments. HOME CARE INSTRUCTIONS   Drink plenty of water. Water helps thin the mucus so your sinuses can drain more easily.  Use a humidifier.  Inhale steam 3 to 4 times a day (for example, sit in the bathroom with the shower running).  Apply a warm, moist washcloth to your face 3 to 4 times a day, or as directed by your health care provider.  Use saline nasal sprays to help moisten  and clean your sinuses.  Take medicines only as directed by your health care provider.  If you were prescribed either an antibiotic or antifungal medicine, finish it all even if you start to feel better. SEEK IMMEDIATE MEDICAL CARE IF:  You have increasing pain or severe headaches.  You have nausea, vomiting, or drowsiness.  You have  swelling around your face.  You have vision problems.  You have a stiff neck.  You have difficulty breathing. MAKE SURE YOU:   Understand these instructions.  Will watch your condition.  Will get help right away if you are not doing well or get worse. Document Released: 06/16/2005 Document Revised: 10/31/2013 Document Reviewed: 07/01/2011 [facility] Patient Information 2015 [place], [place]. This information is not intended to replace advice given to you by your health care provider. Make sure you discuss any questions you have with your health care provider.

## 2014-07-07 NOTE — Progress Notes (Signed)
   Subjective:    Patient ID: Jimmy LeversJohn H Capuano, male    DOB: [birth year], 81 y.o.   MRN: [id number]  Sinusitis This is a new problem. The current episode started in the past 7 days. The problem is unchanged. There has been no fever. The pain is mild. Associated symptoms include congestion, coughing and a sore throat. Pertinent negatives include no ear pain. Treatments tried: allergy medication. The treatment provided no relief.   Patient states that he has no other concerns at this time.    Review of Systems  Constitutional: Negative for fever and activity change.  HENT: Positive for congestion, rhinorrhea and sore throat. Negative for ear pain.   Eyes: Negative for discharge.  Respiratory: Positive for cough. Negative for wheezing.   Cardiovascular: Negative for chest pain.       Objective:   Physical Exam  Constitutional: He appears well-developed.  HENT:  Head: Normocephalic.  Mouth/Throat: Oropharynx is clear and moist. No oropharyngeal exudate.  Neck: Normal range of motion.  Cardiovascular: Normal rate, regular rhythm and normal heart sounds.   No murmur heard. Pulmonary/Chest: Effort normal and breath sounds normal. He has no wheezes.  Lymphadenopathy:    He has no cervical adenopathy.  Neurological: He exhibits normal muscle tone.  Skin: Skin is warm and dry.  Nursing note and vitals reviewed.         Assessment & Plan:   acute sinusitis as a result of recent virus antibiotics prescribed warning signs discussed. No sign of pneumonia.   he was advsed to go to cardiology to check his INR within 7-10 days

## 2014-07-17 ENCOUNTER — Ambulatory Visit (INDEPENDENT_AMBULATORY_CARE_PROVIDER_SITE_OTHER): Payer: Medicare Other | Admitting: *Deleted

## 2014-07-17 DIAGNOSIS — Z5181 Encounter for therapeutic drug level monitoring: Secondary | ICD-10-CM

## 2014-07-17 DIAGNOSIS — I4891 Unspecified atrial fibrillation: Secondary | ICD-10-CM

## 2014-07-17 DIAGNOSIS — Z7901 Long term (current) use of anticoagulants: Secondary | ICD-10-CM

## 2014-07-17 LAB — POCT INR: INR: 2.5

## 2014-07-31 ENCOUNTER — Ambulatory Visit (INDEPENDENT_AMBULATORY_CARE_PROVIDER_SITE_OTHER): Payer: Medicare Other | Admitting: *Deleted

## 2014-07-31 DIAGNOSIS — I480 Paroxysmal atrial fibrillation: Secondary | ICD-10-CM

## 2014-07-31 DIAGNOSIS — Z7901 Long term (current) use of anticoagulants: Secondary | ICD-10-CM

## 2014-07-31 DIAGNOSIS — I4891 Unspecified atrial fibrillation: Secondary | ICD-10-CM

## 2014-07-31 DIAGNOSIS — Z5181 Encounter for therapeutic drug level monitoring: Secondary | ICD-10-CM

## 2014-07-31 LAB — POCT INR: INR: 2.8

## 2014-08-21 ENCOUNTER — Ambulatory Visit: Payer: Medicare Other | Admitting: Family Medicine

## 2014-08-21 ENCOUNTER — Emergency Department (HOSPITAL_COMMUNITY): Payer: Medicare Other

## 2014-08-21 ENCOUNTER — Encounter (HOSPITAL_COMMUNITY): Payer: Self-pay

## 2014-08-21 ENCOUNTER — Emergency Department (HOSPITAL_COMMUNITY)
Admission: EM | Admit: 2014-08-21 | Discharge: 2014-08-21 | Disposition: A | Discharge status: Home / Self Care | Payer: Medicare Other | Attending: Emergency Medicine | Admitting: Emergency Medicine

## 2014-08-21 DIAGNOSIS — Z9861 Coronary angioplasty status: Secondary | ICD-10-CM | POA: Diagnosis not present

## 2014-08-21 DIAGNOSIS — Z951 Presence of aortocoronary bypass graft: Secondary | ICD-10-CM | POA: Diagnosis not present

## 2014-08-21 DIAGNOSIS — E785 Hyperlipidemia, unspecified: Secondary | ICD-10-CM | POA: Insufficient documentation

## 2014-08-21 DIAGNOSIS — Z862 Personal history of diseases of the blood and blood-forming organs and certain disorders involving the immune mechanism: Secondary | ICD-10-CM | POA: Diagnosis not present

## 2014-08-21 DIAGNOSIS — Z792 Long term (current) use of antibiotics: Secondary | ICD-10-CM | POA: Insufficient documentation

## 2014-08-21 DIAGNOSIS — I4891 Unspecified atrial fibrillation: Secondary | ICD-10-CM | POA: Diagnosis not present

## 2014-08-21 DIAGNOSIS — Z87442 Personal history of urinary calculi: Secondary | ICD-10-CM | POA: Diagnosis not present

## 2014-08-21 DIAGNOSIS — Z7982 Long term (current) use of aspirin: Secondary | ICD-10-CM | POA: Insufficient documentation

## 2014-08-21 DIAGNOSIS — N3090 Cystitis, unspecified without hematuria: Secondary | ICD-10-CM | POA: Insufficient documentation

## 2014-08-21 DIAGNOSIS — Z79899 Other long term (current) drug therapy: Secondary | ICD-10-CM | POA: Insufficient documentation

## 2014-08-21 DIAGNOSIS — R319 Hematuria, unspecified: Secondary | ICD-10-CM

## 2014-08-21 DIAGNOSIS — I509 Heart failure, unspecified: Secondary | ICD-10-CM | POA: Diagnosis not present

## 2014-08-21 DIAGNOSIS — R0989 Other specified symptoms and signs involving the circulatory and respiratory systems: Secondary | ICD-10-CM | POA: Diagnosis not present

## 2014-08-21 DIAGNOSIS — R1110 Vomiting, unspecified: Secondary | ICD-10-CM | POA: Diagnosis not present

## 2014-08-21 DIAGNOSIS — R05 Cough: Secondary | ICD-10-CM | POA: Diagnosis not present

## 2014-08-21 DIAGNOSIS — J3489 Other specified disorders of nose and nasal sinuses: Secondary | ICD-10-CM | POA: Insufficient documentation

## 2014-08-21 DIAGNOSIS — I10 Essential (primary) hypertension: Secondary | ICD-10-CM | POA: Diagnosis not present

## 2014-08-21 DIAGNOSIS — R109 Unspecified abdominal pain: Secondary | ICD-10-CM

## 2014-08-21 DIAGNOSIS — Z7901 Long term (current) use of anticoagulants: Secondary | ICD-10-CM | POA: Insufficient documentation

## 2014-08-21 DIAGNOSIS — R350 Frequency of micturition: Secondary | ICD-10-CM | POA: Diagnosis present

## 2014-08-21 DIAGNOSIS — M549 Dorsalgia, unspecified: Secondary | ICD-10-CM | POA: Diagnosis not present

## 2014-08-21 LAB — COMPREHENSIVE METABOLIC PANEL
ALBUMIN: 3.4 g/dL — AB (ref 3.5–5.2)
ALT: 15 U/L (ref 0–53)
AST: 18 U/L (ref 0–37)
Alkaline Phosphatase: 83 U/L (ref 39–117)
Anion gap: 8 (ref 5–15)
BUN: 17 mg/dL (ref 6–23)
CO2: 25 mmol/L (ref 19–32)
Calcium: 8.4 mg/dL (ref 8.4–10.5)
Chloride: 100 mmol/L (ref 96–112)
Creatinine, Ser: 1 mg/dL (ref 0.50–1.35)
GFR calc Af Amer: 79 mL/min — ABNORMAL LOW (ref >90)
GFR, EST NON AFRICAN AMERICAN: 68 mL/min — AB (ref >90)
GLUCOSE: 117 mg/dL — AB (ref 70–99)
Potassium: 4 mmol/L (ref 3.5–5.1)
Sodium: 133 mmol/L — ABNORMAL LOW (ref 135–145)
Total Bilirubin: 1.1 mg/dL (ref 0.3–1.2)
Total Protein: 7.1 g/dL (ref 6.0–8.3)

## 2014-08-21 LAB — CBC WITH DIFFERENTIAL/PLATELET
BASOS ABS: 0 10*3/uL (ref 0.0–0.1)
Basophils Relative: 0 % (ref 0–1)
EOS PCT: 0 % (ref 0–5)
Eosinophils Absolute: 0 10*3/uL (ref 0.0–0.7)
HEMATOCRIT: 41.5 % (ref 39.0–52.0)
HEMOGLOBIN: 14.2 g/dL (ref 13.0–17.0)
LYMPHS ABS: 0.6 10*3/uL — AB (ref 0.7–4.0)
Lymphocytes Relative: 5 % — ABNORMAL LOW (ref 12–46)
MCH: 32.6 pg (ref 26.0–34.0)
MCHC: 34.2 g/dL (ref 30.0–36.0)
MCV: 95.4 fL (ref 78.0–100.0)
MONOS PCT: 6 % (ref 3–12)
Monocytes Absolute: 0.6 10*3/uL (ref 0.1–1.0)
Neutro Abs: 10.3 10*3/uL — ABNORMAL HIGH (ref 1.7–7.7)
Neutrophils Relative %: 89 % — ABNORMAL HIGH (ref 43–77)
Platelets: 158 10*3/uL (ref 150–400)
RBC: 4.35 MIL/uL (ref 4.22–5.81)
RDW: 14.2 % (ref 11.5–15.5)
WBC: 11.6 10*3/uL — ABNORMAL HIGH (ref 4.0–10.5)

## 2014-08-21 LAB — URINALYSIS, ROUTINE W REFLEX MICROSCOPIC
Bilirubin Urine: NEGATIVE
GLUCOSE, UA: NEGATIVE mg/dL
Ketones, ur: NEGATIVE mg/dL
Leukocytes, UA: NEGATIVE
Nitrite: NEGATIVE
Protein, ur: 30 mg/dL — AB
Specific Gravity, Urine: 1.02 (ref 1.005–1.030)
Urobilinogen, UA: 0.2 mg/dL (ref 0.0–1.0)
pH: 6 (ref 5.0–8.0)

## 2014-08-21 LAB — LIPASE, BLOOD: Lipase: 21 U/L (ref 11–59)

## 2014-08-21 LAB — URINE MICROSCOPIC-ADD ON

## 2014-08-21 LAB — CBG MONITORING, ED: Glucose-Capillary: 112 mg/dL — ABNORMAL HIGH (ref 70–99)

## 2014-08-21 MED ORDER — ONDANSETRON 4 MG PO TBDP: 4.0000 mg | ORAL_TABLET | Freq: Three times a day (TID) | ORAL | Status: DC | PRN

## 2014-08-21 MED ORDER — IOHEXOL 300 MG/ML  SOLN
100.0000 mL | Freq: Once | INTRAMUSCULAR | Status: AC | PRN
  Administered 2014-08-21: 100 mL via INTRAVENOUS

## 2014-08-21 MED ORDER — DEXTROSE 5 % IV SOLN
1.0000 g | Freq: Once | INTRAVENOUS | Status: AC
  Administered 2014-08-21: 1 g via INTRAVENOUS
  Filled 2014-08-21: qty 10

## 2014-08-21 MED ORDER — CEPHALEXIN 500 MG PO CAPS: 500.0000 mg | ORAL_CAPSULE | Freq: Four times a day (QID) | ORAL | Status: DC

## 2014-08-21 MED ORDER — SODIUM CHLORIDE 0.9 % IV SOLN
INTRAVENOUS | Status: DC
  Administered 2014-08-21: 16:00:00 via INTRAVENOUS

## 2014-08-21 MED ORDER — ONDANSETRON HCL 4 MG/2ML IJ SOLN
4.0000 mg | Freq: Once | INTRAMUSCULAR | Status: AC
  Administered 2014-08-21: 4 mg via INTRAVENOUS
  Filled 2014-08-21: qty 2

## 2014-08-21 MED ORDER — SODIUM CHLORIDE 0.9 % IV BOLUS (SEPSIS)
250.0000 mL | Freq: Once | INTRAVENOUS | Status: AC
  Administered 2014-08-21: 250 mL via INTRAVENOUS

## 2014-08-21 NOTE — ED Notes (Signed)
Pt states he has had urinary frequency for three days and just feeling like blah. States he had an appointment with Dr. Gerda DissLuking today but he wasn't able to drive and his wife does not drive

## 2014-08-21 NOTE — ED Provider Notes (Signed)
CSN: [id number]     Arrival date & time 08/21/14  1256 History  This chart was scribed for Vanetta Mulders, MD by Ronney Lion, ED Scribe. This patient was seen in room [facility] and the patient's care was started at 1:24 PM.    Chief Complaint  Patient presents with  . Urinary Frequency   Patient is a 81 y.o. male presenting with frequency. The history is provided by the patient. No language interpreter was used.  Urinary Frequency This is a new problem. The current episode started more than 2 days ago. The problem occurs constantly. The problem has not changed since onset.Pertinent negatives include no chest pain, no abdominal pain and no headaches. Nothing aggravates the symptoms. Nothing relieves the symptoms. He has tried nothing for the symptoms.     HPI Comments: [name] is a 81 y.o. male who presents to the Emergency Department with a history of complaining of constant polyuria that began 3 days ago. He complains of associated fever, chills, cough, rhinorrhea, nausea, and back pain. This is a new problem. Patient denies any associated pain. Patient has a history of bleeding easily, as he is on Coumadin. He denies a history of DM. He denies dysuria, vomiting, visual changes, sore throat, chest pain, SOB, abdominal pain, diarrhea, hematuria, leg swelling, bleeding easily, myalgia, rash, or headache.  Dr. Gerda Diss is patient's PCP.  Past Medical History  Diagnosis Date  . ASCVD (arteriosclerotic cardiovascular disease)     CABG 11/06; MUGA 11/08 - 44%; 3/07 mildly decreased EF on echo  . Atrial fibrillation     Initially occurred following CABG; event recorder in 11/08 - no AF; recurred 08/2009  . Anticoagulant disorder   . Hypertension   . Hyperlipidemia   . Overweight(278.02)   . Nephrolithiasis   . CHF (congestive heart failure)    Past Surgical History  Procedure Laterality Date  . Coronary artery bypass graft  04/2005  . Colonoscopy  2008  . Angioplasty  1991   Family  History  Problem Relation Age of Onset  . Hypertension Father   . Heart attack Father   . Diabetes Brother    History  Substance Use Topics  . Smoking status: Never Smoker   . Smokeless tobacco: Not on file  . Alcohol Use: No    Review of Systems  Constitutional: Positive for fever and chills.  HENT: Positive for rhinorrhea. Negative for sore throat.   Eyes: Negative for visual disturbance.  Respiratory: Positive for cough.   Cardiovascular: Negative for chest pain and leg swelling.  Gastrointestinal: Positive for nausea. Negative for vomiting, abdominal pain and diarrhea.  Genitourinary: Positive for frequency. Negative for dysuria and hematuria.  Musculoskeletal: Positive for back pain. Negative for myalgias.  Skin: Negative for rash.  Neurological: Negative for headaches.  Hematological: Does not bruise/bleed easily.  All other systems reviewed and are negative.     Allergies  Ibuprofen  Home Medications   Prior to Admission medications   Medication Sig Start Date End Date Taking? Authorizing Provider  amLODipine-benazepril (LOTREL) 10-40 MG per capsule TAKE 1 CAPSULE BY MOUTH ONCE A DAY. 06/12/14  Yes Antoine Poche, MD  atorvastatin (LIPITOR) 40 MG tablet TAKE (1) TABLET BY MOUTH ONCE DAILY. 05/22/14  Yes Antoine Poche, MD  furosemide (LASIX) 20 MG tablet TAKE (1) TABLET BY MOUTH ONCE DAILY. 01/27/14  Yes Antoine Poche, MD  metoprolol succinate (TOPROL-XL) 50 MG 24 hr tablet TAKE 1 TABLET ONCE DAILY AFTER A MEAL. 03/13/14  Yes Antoine Poche, MD  warfarin (COUMADIN) 5 MG tablet Take coumadin 1/2 tablet daily except 1 tablet on Sundays and Thursdays Patient taking differently: Take 2.5-5 mg by mouth daily. Patient takes 2.5mg  on Sun.,Tues.,Thurs.,and  all other days 08/09/13  Yes Antoine Poche, MD  amoxicillin-clavulanate (AUGMENTIN) 875-125 MG per tablet Take 1 tablet by mouth 2 (two) times daily. Patient not taking: Reported on 08/21/2014 07/07/14    Babs Sciara, MD  aspirin EC 81 MG tablet Take 81 mg by mouth daily.    Historical Provider, MD  cephALEXin (KEFLEX) 500 MG capsule Take 1 capsule (500 mg total) by mouth 4 (four) times daily. 08/21/14   Vanetta Mulders, MD  ondansetron (ZOFRAN ODT) 4 MG disintegrating tablet Take 1 tablet (4 mg total) by mouth every 8 (eight) hours as needed. 08/21/14   Vanetta Mulders, MD  warfarin (COUMADIN) 5 MG tablet Take 1 tablet daily except 1/2 tablet on Sundays, Tuesdays and Thursdays Patient not taking: Reported on 08/21/2014 05/11/14   Antoine Poche, MD   BP 142/61 mmHg  Pulse 86  Temp(Src) 98.5 F (36.9 C) (Oral)  Resp 18  Ht  (1.727 m)  Wt 260 lb (117.935 kg)  BMI 39.54 kg/m2  SpO2 97% Physical Exam  Constitutional: He is oriented to person, place, and time. He appears well-developed and well-nourished. No distress.  HENT:  Head: Normocephalic and atraumatic.  Mouth/Throat: Oropharynx is clear and moist.  Mucous membranes are moist.  Eyes: Conjunctivae and EOM are normal. Pupils are equal, round, and reactive to light. No scleral icterus.  Neck: Neck supple. No tracheal deviation present.  Cardiovascular: Normal rate and regular rhythm.   Pulmonary/Chest: Effort normal and breath sounds normal. No respiratory distress. He has no wheezes. He has no rales.  Lungs are clear to auscultation.  Abdominal: Soft. Bowel sounds are normal. There is tenderness.  Lower abdominal tenderness.  Musculoskeletal: Normal range of motion. Edema: No ankle swelling.  Neurological: He is alert and oriented to person, place, and time.  Skin: Skin is warm and dry.  Psychiatric: He has a normal mood and affect. His behavior is normal.  Nursing note and vitals reviewed.   ED Course  Procedures (including critical care time)  DIAGNOSTIC STUDIES: Oxygen Saturation is 97% on room air, normal by my interpretation.    COORDINATION OF CARE: 1:28 PM - Discussed treatment plan with pt at bedside which  includes tests and nausea medication, and pt agreed to plan.   Labs Review Labs Reviewed  URINALYSIS, ROUTINE W REFLEX MICROSCOPIC - Abnormal; Notable for the following:    Hgb urine dipstick LARGE (*)    Protein, ur 30 (*)    All other components within normal limits  COMPREHENSIVE METABOLIC PANEL - Abnormal; Notable for the following:    Sodium 133 (*)    Glucose, Bld 117 (*)    Albumin 3.4 (*)    GFR calc non Af Amer 68 (*)    GFR calc Af Amer 79 (*)    All other components within normal limits  CBC WITH DIFFERENTIAL/PLATELET - Abnormal; Notable for the following:    WBC 11.6 (*)    Neutrophils Relative % 89 (*)    Neutro Abs 10.3 (*)    Lymphocytes Relative 5 (*)    Lymphs Abs 0.6 (*)    All other components within normal limits  URINE MICROSCOPIC-ADD ON - Abnormal; Notable for the following:    Bacteria, UA FEW (*)  All other components within normal limits  CBG MONITORING, ED - Abnormal; Notable for the following:    Glucose-Capillary 112 (*)    All other components within normal limits  URINE CULTURE  LIPASE, BLOOD   Results for orders placed or performed during the hospital encounter of 08/21/14  Urinalysis, Routine w reflex microscopic  Result Value Ref Range   Color, Urine YELLOW YELLOW   APPearance CLEAR CLEAR   Specific Gravity, Urine 1.020 1.005 - 1.030   pH 6.0 5.0 - 8.0   Glucose, UA NEGATIVE NEGATIVE mg/dL   Hgb urine dipstick LARGE (A) NEGATIVE   Bilirubin Urine NEGATIVE NEGATIVE   Ketones, ur NEGATIVE NEGATIVE mg/dL   Protein, ur 30 (A) NEGATIVE mg/dL   Urobilinogen, UA 0.2 0.0 - 1.0 mg/dL   Nitrite NEGATIVE NEGATIVE   Leukocytes, UA NEGATIVE NEGATIVE  Comprehensive metabolic panel  Result Value Ref Range   Sodium 133 (L) 135 - 145 mmol/L   Potassium 4.0 3.5 - 5.1 mmol/L   Chloride 100 96 - 112 mmol/L   CO2 25 19 - 32 mmol/L   Glucose, Bld 117 (H) 70 - 99 mg/dL   BUN 17 6 - 23 mg/dL   Creatinine, Ser 1.611.00 0.50 - 1.35 mg/dL   Calcium 8.4 8.4 -  09.610.5 mg/dL   Total Protein 7.1 6.0 - 8.3 g/dL   Albumin 3.4 (L) 3.5 - 5.2 g/dL   AST 18 0 - 37 U/L   ALT 15 0 - 53 U/L   Alkaline Phosphatase 83 39 - 117 U/L   Total Bilirubin 1.1 0.3 - 1.2 mg/dL   GFR calc non Af Amer 68 (L) >90 mL/min   GFR calc Af Amer 79 (L) >90 mL/min   Anion gap 8 5 - 15  Lipase, blood  Result Value Ref Range   Lipase 21 11 - 59 U/L  CBC with Differential/Platelet  Result Value Ref Range   WBC 11.6 (H) 4.0 - 10.5 K/uL   RBC 4.35 4.22 - 5.81 MIL/uL   Hemoglobin 14.2 13.0 - 17.0 g/dL   HCT 04.541.5 40.939.0 - 81.152.0 %   MCV 95.4 78.0 - 100.0 fL   MCH 32.6 26.0 - 34.0 pg   MCHC 34.2 30.0 - 36.0 g/dL   RDW 91.414.2 78.211.5 - 95.615.5 %   Platelets 158 150 - 400 K/uL   Neutrophils Relative % 89 (H) 43 - 77 %   Neutro Abs 10.3 (H) 1.7 - 7.7 K/uL   Lymphocytes Relative 5 (L) 12 - 46 %   Lymphs Abs 0.6 (L) 0.7 - 4.0 K/uL   Monocytes Relative 6 3 - 12 %   Monocytes Absolute 0.6 0.1 - 1.0 K/uL   Eosinophils Relative 0 0 - 5 %   Eosinophils Absolute 0.0 0.0 - 0.7 K/uL   Basophils Relative 0 0 - 1 %   Basophils Absolute 0.0 0.0 - 0.1 K/uL  Urine microscopic-add on  Result Value Ref Range   RBC / HPF 11-20 <3 RBC/hpf   Bacteria, UA FEW (A) RARE  POC CBG, ED  Result Value Ref Range   Glucose-Capillary 112 (H) 70 - 99 mg/dL     Imaging Review Dg Chest 2 View  08/21/2014   CLINICAL DATA:  81 year old male with urinary frequency, abdominal pain and congestion. Initial encounter.  EXAM: CHEST  2 VIEW  COMPARISON:  CT Abdomen and Pelvis from today reported separately. Chest radiograph 01/19/2007 and earlier.  FINDINGS: Stable sequelae of CABG. Stable cardiomegaly and mediastinal contours.  Mild respiratory motion artifact. No pneumothorax or pulmonary edema. No pleural effusion or confluent pulmonary opacity. Calcified atherosclerosis of the aorta. No acute osseous abnormality identified.  IMPRESSION: Stable cardiomegaly. No acute cardiopulmonary abnormality.   Electronically Signed   By:  Odessa Fleming M.D.   On: 08/21/2014 15:33   Ct Abdomen Pelvis W Contrast  08/21/2014   CLINICAL DATA:  Generalized abdominal pain.  EXAM: CT ABDOMEN AND PELVIS WITH CONTRAST  TECHNIQUE: Multidetector CT imaging of the abdomen and pelvis was performed using the standard protocol following bolus administration of intravenous contrast.  CONTRAST:  OMNIPAQUE IOHEXOL 300 MG/ML  SOLN  COMPARISON:  None.  FINDINGS: Visualized lung bases appear normal. No significant osseous abnormality is noted.  No gallstones are noted. The liver, spleen and pancreas appear normal. Adrenal glands appear normal. No renal or ureteral calculi are noted. No hydronephrosis or renal obstruction is noted. Simple cyst arises from lower pole of right kidney. The appendix appears normal. There is no evidence of bowel obstruction. Atherosclerotic calcifications of abdominal aorta are noted without aneurysm formation. No abnormal fluid collection is noted. Mild inflammatory stranding is noted around the urinary bladder suggesting possible cystitis. Sigmoid diverticulosis is noted without inflammation.  IMPRESSION: Sigmoid diverticulosis is noted without inflammation.  Mild inflammatory changes are noted around the urinary bladder concerning for possible cystitis.  No other significant abnormality seen in the abdomen or pelvis.   Electronically Signed   By: Lupita Raider, M.D.   On: 08/21/2014 15:21     EKG Interpretation None      MDM   Final diagnoses:  Chest congestion  Abdominal pain  Hematuria  Cystitis    I personally performed the services described in this documentation, which was scribed in my presence. The recorded information has been reviewed and is accurate.  Workup O the only significant findings for inflammation around the bladder and hematuria. This we treated as if it could be a bladder infection. Patient started on Rocephin here will be continued on Keflex at home. Antinausea medicine provided. Close follow-up  with primary care doctor is important to make sure resolution of the hematuria. If it does not resolve then additional workup will be required that will include urology. Urine culture is pending.    Vanetta Mulders, MD 08/21/14 [phone]

## 2014-08-21 NOTE — Discharge Instructions (Signed)
Workup shows inflammation of the bladder along with blood in the urine. It is assumed that this is due to infection. Started on antibiotics. It is important that you follow-up with your doctor to make sure the blood in the urine clears. If it does not additional workup may be required. Urine culture is pending. Take Zofran as needed for the nausea. Take the Keflex as directed for the next 7 days for the possible urinary tract infection.

## 2014-08-24 LAB — URINE CULTURE: Colony Count: >=100000

## 2014-08-25 ENCOUNTER — Encounter: Payer: Self-pay | Admitting: Family Medicine

## 2014-08-25 ENCOUNTER — Ambulatory Visit (INDEPENDENT_AMBULATORY_CARE_PROVIDER_SITE_OTHER): Payer: Medicare Other | Admitting: Family Medicine

## 2014-08-25 VITALS — BP 110/68 | Temp 98.2°F | Wt 264.0 lb

## 2014-08-25 DIAGNOSIS — N3000 Acute cystitis without hematuria: Secondary | ICD-10-CM | POA: Diagnosis not present

## 2014-08-25 DIAGNOSIS — R7309 Other abnormal glucose: Secondary | ICD-10-CM | POA: Diagnosis not present

## 2014-08-25 DIAGNOSIS — R270 Ataxia, unspecified: Secondary | ICD-10-CM

## 2014-08-25 DIAGNOSIS — R7303 Prediabetes: Secondary | ICD-10-CM

## 2014-08-25 LAB — POCT GLYCOSYLATED HEMOGLOBIN (HGB A1C): HEMOGLOBIN A1C: 6

## 2014-08-25 MED ORDER — CIPROFLOXACIN HCL 250 MG PO TABS: 250.0000 mg | ORAL_TABLET | Freq: Two times a day (BID) | ORAL | Status: DC

## 2014-08-25 NOTE — Progress Notes (Signed)
ED Antimicrobial Stewardship Positive Culture Follow Up   Jimmy LeversJohn H Sanders is an 81 y.o. male who presented to [facility] 08[facility]21/2014 with a chief complaint of  Chief Complaint  Patient presents with  . Urinary Frequency    Recent Results (from the past 720 hour(s))  Urine culture     Status: None   Collection Time: 08/21/14  4:07 PM  Result Value Ref Range Status   Specimen Description URINE, CLEAN CATCH  Final   Special Requests NONE  Final   Colony Count   Final    >=100,000 COLONIES/ML Performed at [facility]    Culture   Final    ESCHERICHIA COLI Performed at [facility]    Report Status 08/24/2014 FINAL  Final   Organism ID, Bacteria ESCHERICHIA COLI  Final      Susceptibility   Escherichia coli - MIC*    AMPICILLIN >=32 RESISTANT Resistant     CEFAZOLIN RESISTANT      CEFTRIAXONE <=1 SENSITIVE Sensitive     CIPROFLOXACIN <=0.25 SENSITIVE Sensitive     GENTAMICIN <=1 SENSITIVE Sensitive     LEVOFLOXACIN <=0.12 SENSITIVE Sensitive     NITROFURANTOIN <=16 SENSITIVE Sensitive     TOBRAMYCIN <=1 SENSITIVE Sensitive     TRIMETH/SULFA <=20 SENSITIVE Sensitive     PIP/TAZO 8 SENSITIVE Sensitive     * ESCHERICHIA COLI    [x]  Treated with cephalexin, organism resistant to prescribed antimicrobial []  Patient discharged originally without antimicrobial agent and treatment is now indicated  New antibiotic prescription: ciprofloxacin 250mg  po BID x 7 days Warfarin clinic called to let them know of this.  ED Provider: Terri Piedraourtney Forcucci, PA-C   Mickeal SkinnerFrens, [name] [name] 08/25/2014, 10:16 AM Infectious Diseases Pharmacist Phone# [phone]
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
## 2014-08-25 NOTE — Patient Instructions (Signed)
Diabetes Mellitus and Food It is important for you to manage your blood sugar (glucose) level. Your blood glucose level can be greatly affected by what you eat. Eating healthier foods in the appropriate amounts throughout the day at about the same time each day will help you control your blood glucose level. It can also help slow or prevent worsening of your diabetes mellitus. Healthy eating may even help you improve the level of your blood pressure and reach or maintain a healthy weight.  HOW CAN FOOD AFFECT ME? Carbohydrates Carbohydrates affect your blood glucose level more than any other type of food. Your dietitian will help you determine how many carbohydrates to eat at each meal and teach you how to count carbohydrates. Counting carbohydrates is important to keep your blood glucose at a healthy level, especially if you are using insulin or taking certain medicines for diabetes mellitus. Alcohol Alcohol can cause sudden decreases in blood glucose (hypoglycemia), especially if you use insulin or take certain medicines for diabetes mellitus. Hypoglycemia can be a life-threatening condition. Symptoms of hypoglycemia (sleepiness, dizziness, and disorientation) are similar to symptoms of having too much alcohol.  If your health care provider has given you approval to drink alcohol, do so in moderation and use the following guidelines:  Women should not have more than one drink per day, and men should not have more than two drinks per day. One drink is equal to:  12 oz of beer.  5 oz of wine.  1 oz of hard liquor.  Do not drink on an empty stomach.  Keep yourself hydrated. Have water, diet soda, or unsweetened iced tea.  Regular soda, juice, and other mixers might contain a lot of carbohydrates and should be counted. WHAT FOODS ARE NOT RECOMMENDED? As you make food choices, it is important to remember that all foods are not the same. Some foods have fewer nutrients per serving than other  foods, even though they might have the same number of calories or carbohydrates. It is difficult to get your body what it needs when you eat foods with fewer nutrients. Examples of foods that you should avoid that are high in calories and carbohydrates but low in nutrients include:  Trans fats (most processed foods list trans fats on the Nutrition Facts label).  Regular soda.  Juice.  Candy.  Sweets, such as cake, pie, doughnuts, and cookies.  Fried foods. WHAT FOODS CAN I EAT? Have nutrient-rich foods, which will nourish your body and keep you healthy. The food you should eat also will depend on several factors, including:  The calories you need.  The medicines you take.  Your weight.  Your blood glucose level.  Your blood pressure level.  Your cholesterol level. You also should eat a variety of foods, including:  Protein, such as meat, poultry, fish, tofu, nuts, and seeds (lean animal proteins are best).  Fruits.  Vegetables.  Dairy products, such as milk, cheese, and yogurt (low fat is best).  Breads, grains, pasta, cereal, rice, and beans.  Fats such as olive oil, trans fat-free margarine, canola oil, avocado, and olives. DOES EVERYONE WITH DIABETES MELLITUS HAVE THE SAME MEAL PLAN? Because every person with diabetes mellitus is different, there is not one meal plan that works for everyone. It is very important that you meet with a dietitian who will help you create a meal plan that is just right for you. Document Released: 03/13/2005 Document Revised: 06/21/2013 Document Reviewed: 05/13/2013 [employer] Patient Information 2015 [employer]. This   information is not intended to replace advice given to you by your health care provider. Make sure you discuss any questions you have with your health care provider.  

## 2014-08-27 ENCOUNTER — Telehealth (HOSPITAL_BASED_OUTPATIENT_CLINIC_OR_DEPARTMENT_OTHER): Payer: Self-pay | Admitting: Emergency Medicine

## 2014-08-28 ENCOUNTER — Ambulatory Visit (INDEPENDENT_AMBULATORY_CARE_PROVIDER_SITE_OTHER): Payer: Medicare Other | Admitting: *Deleted

## 2014-08-28 DIAGNOSIS — I4891 Unspecified atrial fibrillation: Secondary | ICD-10-CM

## 2014-08-28 DIAGNOSIS — Z7901 Long term (current) use of anticoagulants: Secondary | ICD-10-CM

## 2014-08-28 DIAGNOSIS — Z5181 Encounter for therapeutic drug level monitoring: Secondary | ICD-10-CM

## 2014-08-28 DIAGNOSIS — I480 Paroxysmal atrial fibrillation: Secondary | ICD-10-CM

## 2014-08-28 LAB — POCT INR: INR: 2.8

## 2014-09-04 ENCOUNTER — Ambulatory Visit (INDEPENDENT_AMBULATORY_CARE_PROVIDER_SITE_OTHER): Payer: Medicare Other | Admitting: *Deleted

## 2014-09-04 DIAGNOSIS — I4891 Unspecified atrial fibrillation: Secondary | ICD-10-CM

## 2014-09-04 DIAGNOSIS — Z7901 Long term (current) use of anticoagulants: Secondary | ICD-10-CM

## 2014-09-04 DIAGNOSIS — Z5181 Encounter for therapeutic drug level monitoring: Secondary | ICD-10-CM

## 2014-09-04 DIAGNOSIS — I480 Paroxysmal atrial fibrillation: Secondary | ICD-10-CM

## 2014-09-04 LAB — POCT INR: INR: 2.2

## 2014-09-12 ENCOUNTER — Ambulatory Visit (INDEPENDENT_AMBULATORY_CARE_PROVIDER_SITE_OTHER): Payer: Medicare Other | Admitting: Family Medicine

## 2014-09-12 ENCOUNTER — Encounter: Payer: Self-pay | Admitting: Family Medicine

## 2014-09-12 VITALS — BP 118/72 | Ht 68.5 in | Wt 263.4 lb

## 2014-09-12 DIAGNOSIS — R319 Hematuria, unspecified: Secondary | ICD-10-CM | POA: Diagnosis not present

## 2014-09-12 LAB — POCT URINALYSIS DIPSTICK
SPEC GRAV UA: 1.015
pH, UA: 6

## 2014-09-12 NOTE — Progress Notes (Signed)
   Subjective:    Patient ID: Mamie LeversJohn H Galyean, male    DOB: 1930-08-12, 81 y.o.   MRN: [id number]  HPI Patient arrives for a follow up on hematuria. Took the antibiotic felt better less chills fever went away abdominal discomfort dysuria went away.  Review of Systems Denies dysuria hematuria vomiting diarrhea fever chills    Objective:   Physical Exam  Lungs are clear hearts regular abdomen soft obese no tenderness     urinalysis negative rare rbc rare WBC Assessment & Plan:  Recent UTI resolved If any problems between now in September notify us in follow-up Wellness in September along with urinalysis

## 2014-09-18 ENCOUNTER — Other Ambulatory Visit: Payer: Self-pay | Admitting: Cardiology

## 2014-10-02 ENCOUNTER — Ambulatory Visit (INDEPENDENT_AMBULATORY_CARE_PROVIDER_SITE_OTHER): Payer: Medicare Other | Admitting: *Deleted

## 2014-10-02 DIAGNOSIS — I4891 Unspecified atrial fibrillation: Secondary | ICD-10-CM | POA: Diagnosis not present

## 2014-10-02 DIAGNOSIS — Z5181 Encounter for therapeutic drug level monitoring: Secondary | ICD-10-CM | POA: Diagnosis not present

## 2014-10-02 DIAGNOSIS — Z7901 Long term (current) use of anticoagulants: Secondary | ICD-10-CM

## 2014-10-02 DIAGNOSIS — I480 Paroxysmal atrial fibrillation: Secondary | ICD-10-CM | POA: Diagnosis not present

## 2014-10-02 LAB — POCT INR: INR: 1.9

## 2014-10-23 ENCOUNTER — Ambulatory Visit (INDEPENDENT_AMBULATORY_CARE_PROVIDER_SITE_OTHER): Payer: Medicare Other | Admitting: *Deleted

## 2014-10-23 DIAGNOSIS — I480 Paroxysmal atrial fibrillation: Secondary | ICD-10-CM | POA: Diagnosis not present

## 2014-10-23 DIAGNOSIS — I4891 Unspecified atrial fibrillation: Secondary | ICD-10-CM | POA: Diagnosis not present

## 2014-10-23 DIAGNOSIS — Z7901 Long term (current) use of anticoagulants: Secondary | ICD-10-CM

## 2014-10-23 DIAGNOSIS — Z5181 Encounter for therapeutic drug level monitoring: Secondary | ICD-10-CM | POA: Diagnosis not present

## 2014-10-23 LAB — POCT INR: INR: 2

## 2014-11-20 ENCOUNTER — Ambulatory Visit (INDEPENDENT_AMBULATORY_CARE_PROVIDER_SITE_OTHER): Payer: Medicare Other | Admitting: *Deleted

## 2014-11-20 ENCOUNTER — Other Ambulatory Visit: Payer: Self-pay | Admitting: Cardiology

## 2014-11-20 DIAGNOSIS — Z5181 Encounter for therapeutic drug level monitoring: Secondary | ICD-10-CM | POA: Diagnosis not present

## 2014-11-20 DIAGNOSIS — Z7901 Long term (current) use of anticoagulants: Secondary | ICD-10-CM

## 2014-11-20 DIAGNOSIS — I4891 Unspecified atrial fibrillation: Secondary | ICD-10-CM | POA: Diagnosis not present

## 2014-11-20 LAB — POCT INR: INR: 3

## 2014-12-18 ENCOUNTER — Other Ambulatory Visit: Payer: Self-pay | Admitting: Cardiology

## 2014-12-20 ENCOUNTER — Ambulatory Visit (INDEPENDENT_AMBULATORY_CARE_PROVIDER_SITE_OTHER): Payer: Medicare Other | Admitting: *Deleted

## 2014-12-20 DIAGNOSIS — Z5181 Encounter for therapeutic drug level monitoring: Secondary | ICD-10-CM | POA: Diagnosis not present

## 2014-12-20 DIAGNOSIS — I4891 Unspecified atrial fibrillation: Secondary | ICD-10-CM

## 2014-12-20 DIAGNOSIS — Z7901 Long term (current) use of anticoagulants: Secondary | ICD-10-CM | POA: Diagnosis not present

## 2014-12-20 LAB — POCT INR: INR: 3.1

## 2015-01-02 ENCOUNTER — Encounter: Payer: Self-pay | Admitting: Cardiology

## 2015-01-02 ENCOUNTER — Ambulatory Visit (INDEPENDENT_AMBULATORY_CARE_PROVIDER_SITE_OTHER): Payer: Medicare Other | Admitting: Cardiology

## 2015-01-02 VITALS — BP 130/74 | HR 59 | Ht 72.0 in | Wt 260.0 lb

## 2015-01-02 DIAGNOSIS — I10 Essential (primary) hypertension: Secondary | ICD-10-CM | POA: Diagnosis not present

## 2015-01-02 DIAGNOSIS — I2510 Atherosclerotic heart disease of native coronary artery without angina pectoris: Secondary | ICD-10-CM

## 2015-01-02 DIAGNOSIS — I4891 Unspecified atrial fibrillation: Secondary | ICD-10-CM

## 2015-01-02 DIAGNOSIS — E785 Hyperlipidemia, unspecified: Secondary | ICD-10-CM

## 2015-01-02 MED ORDER — ATORVASTATIN CALCIUM 80 MG PO TABS: 80.0000 mg | ORAL_TABLET | Freq: Every day | ORAL | Status: DC

## 2015-01-02 NOTE — Progress Notes (Signed)
Clinical Summary Mr. Littie DeedsGentry is a 81 y.o.male seen today for follow up of the following medical problems.   1. CAD/ICM  - prior [facility] in Nov [facility] - no chest pain. Denies any SOB/DOE. No LE edema. - compliant with meds  2. Afib  - no palpitations. - compliant with coumadin, notes easy bruising but no significant bleeding recently. Did have some hematuria few months ago that resolved.   3. HTN  - does not check at home  - compliant with meds   4. HL  - compliant with statin - 02/2014 TC 128 TG 82 HDL 49 LDL 63  Past Medical History  Diagnosis Date  . ASCVD (arteriosclerotic cardiovascular disease)     CABG 11/06; MUGA 11/08 - 44%; 3/07 mildly decreased EF on echo  . Atrial fibrillation     Initially occurred following CABG; event recorder in 11/08 - no AF; recurred 08/2009  . Anticoagulant disorder   . Hypertension   . Hyperlipidemia   . Overweight(278.02)   . Nephrolithiasis   . CHF (congestive heart failure)      Allergies  Allergen Reactions  . Ibuprofen     REACTION: skin rash     Current Outpatient Prescriptions  Medication Sig Dispense Refill  . amLODipine-benazepril (LOTREL) 10-40 MG per capsule TAKE 1 CAPSULE BY MOUTH ONCE A DAY. 30 capsule 11  . aspirin EC 81 MG tablet Take 81 mg by mouth daily.    [name]. atorvastatin (LIPITOR) 40 MG tablet TAKE (1) TABLET BY MOUTH ONCE DAILY. 30 tablet 11  . furosemide (LASIX) 20 MG tablet TAKE (1) TABLET BY MOUTH ONCE DAILY. 30 tablet 6  . metoprolol succinate (TOPROL-XL) 50 MG 24 hr tablet TAKE 1 TABLET ONCE DAILY AFTER A MEAL. 30 tablet 1  . warfarin (COUMADIN) 5 MG tablet Take coumadin 1/2 tablet daily except 1 tablet on Sundays and Thursdays (Patient taking differently: Take 2.5-5 mg by mouth daily. Patient takes 5MG  ON SUNDAYS TUESDAYS AND THURSDAYS. ALL OTHER DAYS TAKE 2.5MG ) 30 tablet 6   No current facility-administered medications for this visit.     Past Surgical History  Procedure  Laterality Date  . Coronary artery bypass graft  04/2005  . Colonoscopy  2008  . Angioplasty  1991     Allergies  Allergen Reactions  . Ibuprofen     REACTION: skin rash      Family History  Problem Relation Age of Onset  . Hypertension Father   . Heart attack Father   . Diabetes Brother      Social History Mr. Littie DeedsGentry reports that he has never smoked. He does not have any smokeless tobacco history on file. Mr. Littie DeedsGentry reports that he does not drink alcohol.   Review of Systems CONSTITUTIONAL: No weight loss, fever, chills, weakness or fatigue.  HEENT: Eyes: No visual loss, blurred vision, double vision or yellow sclerae.No hearing loss, sneezing, congestion, runny nose or sore throat.  SKIN: No rash or itching.  CARDIOVASCULAR: per HPI RESPIRATORY: No shortness of breath, cough or sputum.  GASTROINTESTINAL: No anorexia, nausea, vomiting or diarrhea. No abdominal pain or blood.  GENITOURINARY: No burning on urination, no polyuria NEUROLOGICAL: No headache, dizziness, syncope, paralysis, ataxia, numbness or tingling in the extremities. No change in bowel or bladder control.  MUSCULOSKELETAL: No muscle, back pain, joint pain or stiffness.  LYMPHATICS: No enlarged nodes. No history of splenectomy.  PSYCHIATRIC: No history of depression or anxiety.  ENDOCRINOLOGIC: No reports of sweating,  cold or heat intolerance. No polyuria or polydipsia.  [name]   Physical Examination Filed Vitals:   01/02/15 1031  BP: 130/74  Pulse: 59   Filed Vitals:   01/02/15 1031  Height: 6' (1.829 m)  Weight: 260 lb (117.935 kg)    Gen: resting comfortably, no acute distress HEENT: no scleral icterus, pupils equal round and reactive, no palptable cervical adenopathy,  CV: RRR, no m/r/g, no JVD Resp: Clear to auscultation bilaterally GI: abdomen is soft, non-tender, non-distended, normal bowel sounds, no hepatosplenomegaly MSK: extremities are warm, no edema.  Skin: warm, no rash Neuro:  no  focal deficits Psych: appropriate affect   Diagnostic Studies July 2011 Echo: mild to moderately decreased LVEF (no LVEF given), apex akinetic, severe LVH, severe LAE   05/05/13 Clinic EKG: afib at 58 bpm, LAD, anteroseptal and anterolateral Q waves, inferior Q waves   05/2013 Echo  Technically difficult study, severe LVH, elevated LA pressure. Difficult to evalute LV function even with contrast, appears low normal to normal.     Assessment and Plan   1. CAD - continue risk factor modification and secondary prevention  - no current symptoms  2. Afib  - denies any current symptoms  - continue rate control strategy and anticoagulation   3. HTN  - at goal today, continue to follow clinically   4. Hyperlipidemia  - given CAD history change to atorvastatin  daily.    F/u 1 year  Antoine Poche, M.D.

## 2015-01-02 NOTE — Patient Instructions (Signed)
Your physician wants you to follow-up in:  1 YEAR WITH DR. BRANCH. You will receive a reminder letter in the mail two months in advance. If you don't receive a letter, please call our office to schedule the follow-up appointment.  STOP ASPIRIN   INCREASE ATORVASTATIN TO 80 MG ONCE DAILY   Thanks for choosing [facility]!!!

## 2015-01-06 ENCOUNTER — Encounter (HOSPITAL_COMMUNITY): Payer: Self-pay | Admitting: *Deleted

## 2015-01-06 ENCOUNTER — Emergency Department (HOSPITAL_COMMUNITY)
Admission: EM | Admit: 2015-01-06 | Discharge: 2015-01-06 | Disposition: A | Discharge status: Home / Self Care | Payer: Medicare Other | Attending: Emergency Medicine | Admitting: Emergency Medicine

## 2015-01-06 DIAGNOSIS — Z87442 Personal history of urinary calculi: Secondary | ICD-10-CM | POA: Diagnosis not present

## 2015-01-06 DIAGNOSIS — Z9861 Coronary angioplasty status: Secondary | ICD-10-CM | POA: Insufficient documentation

## 2015-01-06 DIAGNOSIS — Z7901 Long term (current) use of anticoagulants: Secondary | ICD-10-CM | POA: Insufficient documentation

## 2015-01-06 DIAGNOSIS — I4891 Unspecified atrial fibrillation: Secondary | ICD-10-CM | POA: Insufficient documentation

## 2015-01-06 DIAGNOSIS — I10 Essential (primary) hypertension: Secondary | ICD-10-CM | POA: Diagnosis not present

## 2015-01-06 DIAGNOSIS — I509 Heart failure, unspecified: Secondary | ICD-10-CM | POA: Insufficient documentation

## 2015-01-06 DIAGNOSIS — I2510 Atherosclerotic heart disease of native coronary artery without angina pectoris: Secondary | ICD-10-CM | POA: Insufficient documentation

## 2015-01-06 DIAGNOSIS — R740 Nonspecific elevation of levels of transaminase and lactic acid dehydrogenase [LDH]: Secondary | ICD-10-CM | POA: Diagnosis not present

## 2015-01-06 DIAGNOSIS — Z79899 Other long term (current) drug therapy: Secondary | ICD-10-CM | POA: Insufficient documentation

## 2015-01-06 DIAGNOSIS — R112 Nausea with vomiting, unspecified: Secondary | ICD-10-CM

## 2015-01-06 DIAGNOSIS — K529 Noninfective gastroenteritis and colitis, unspecified: Secondary | ICD-10-CM | POA: Diagnosis not present

## 2015-01-06 DIAGNOSIS — Z951 Presence of aortocoronary bypass graft: Secondary | ICD-10-CM | POA: Insufficient documentation

## 2015-01-06 DIAGNOSIS — A084 Viral intestinal infection, unspecified: Secondary | ICD-10-CM | POA: Diagnosis not present

## 2015-01-06 DIAGNOSIS — R197 Diarrhea, unspecified: Secondary | ICD-10-CM

## 2015-01-06 DIAGNOSIS — R7989 Other specified abnormal findings of blood chemistry: Secondary | ICD-10-CM

## 2015-01-06 HISTORY — DX: Atherosclerotic heart disease of native coronary artery without angina pectoris: I25.10

## 2015-01-06 LAB — COMPREHENSIVE METABOLIC PANEL
ALT: 17 U/L (ref 17–63)
AST: 20 U/L (ref 15–41)
Albumin: 3.9 g/dL (ref 3.5–5.0)
Alkaline Phosphatase: 87 U/L (ref 38–126)
Anion gap: 8 (ref 5–15)
BILIRUBIN TOTAL: 1.1 mg/dL (ref 0.3–1.2)
BUN: 23 mg/dL — AB (ref 6–20)
CHLORIDE: 107 mmol/L (ref 101–111)
CO2: 22 mmol/L (ref 22–32)
Calcium: 8.3 mg/dL — ABNORMAL LOW (ref 8.9–10.3)
Creatinine, Ser: 1.01 mg/dL (ref 0.61–1.24)
GFR calc Af Amer: >60 mL/min (ref >60)
GFR calc non Af Amer: >60 mL/min (ref >60)
Glucose, Bld: 128 mg/dL — ABNORMAL HIGH (ref 65–99)
Potassium: 4.2 mmol/L (ref 3.5–5.1)
Sodium: 137 mmol/L (ref 135–145)
TOTAL PROTEIN: 7.4 g/dL (ref 6.5–8.1)

## 2015-01-06 LAB — POC OCCULT BLOOD, ED: Fecal Occult Bld: NEGATIVE

## 2015-01-06 LAB — URINALYSIS, ROUTINE W REFLEX MICROSCOPIC
Bilirubin Urine: NEGATIVE
GLUCOSE, UA: NEGATIVE mg/dL
KETONES UR: NEGATIVE mg/dL
LEUKOCYTES UA: NEGATIVE
NITRITE: NEGATIVE
PROTEIN: NEGATIVE mg/dL
Specific Gravity, Urine: >1.03 — ABNORMAL HIGH (ref 1.005–1.030)
UROBILINOGEN UA: 0.2 mg/dL (ref 0.0–1.0)
pH: 5.5 (ref 5.0–8.0)

## 2015-01-06 LAB — CBC WITH DIFFERENTIAL/PLATELET
Basophils Absolute: 0 10*3/uL (ref 0.0–0.1)
Basophils Relative: 0 % (ref 0–1)
Eosinophils Absolute: 0 10*3/uL (ref 0.0–0.7)
Eosinophils Relative: 0 % (ref 0–5)
HCT: 44.2 % (ref 39.0–52.0)
Hemoglobin: 15.3 g/dL (ref 13.0–17.0)
LYMPHS ABS: 0.5 10*3/uL — AB (ref 0.7–4.0)
LYMPHS PCT: 4 % — AB (ref 12–46)
MCH: 33 pg (ref 26.0–34.0)
MCHC: 34.6 g/dL (ref 30.0–36.0)
MCV: 95.5 fL (ref 78.0–100.0)
Monocytes Absolute: 0.5 10*3/uL (ref 0.1–1.0)
Monocytes Relative: 4 % (ref 3–12)
Neutro Abs: 9.5 10*3/uL — ABNORMAL HIGH (ref 1.7–7.7)
Neutrophils Relative %: 92 % — ABNORMAL HIGH (ref 43–77)
Platelets: 189 10*3/uL (ref 150–400)
RBC: 4.63 MIL/uL (ref 4.22–5.81)
RDW: 14.2 % (ref 11.5–15.5)
WBC: 10.5 10*3/uL (ref 4.0–10.5)

## 2015-01-06 LAB — URINE MICROSCOPIC-ADD ON

## 2015-01-06 LAB — PROTIME-INR
INR: 2.38 — AB (ref 0.00–1.49)
PROTHROMBIN TIME: 25.7 s — AB (ref 11.6–15.2)

## 2015-01-06 LAB — I-STAT CG4 LACTIC ACID, ED: LACTIC ACID, VENOUS: 2.22 mmol/L — AB (ref 0.5–2.0)

## 2015-01-06 MED ORDER — SODIUM CHLORIDE 0.9 % IV BOLUS (SEPSIS)
500.0000 mL | Freq: Once | INTRAVENOUS | Status: AC
  Administered 2015-01-06: 500 mL via INTRAVENOUS

## 2015-01-06 MED ORDER — ACETAMINOPHEN 325 MG PO TABS
650.0000 mg | ORAL_TABLET | Freq: Once | ORAL | Status: AC
  Administered 2015-01-06: 650 mg via ORAL
  Filled 2015-01-06: qty 2

## 2015-01-06 NOTE — Discharge Instructions (Signed)

## 2015-01-06 NOTE — ED Provider Notes (Signed)
CSN: [id number]     Arrival date & time 01/06/15  1847 History   First MD Initiated Contact with Patient 01/06/15 1859     Chief Complaint  Patient presents with  . Nausea  . Diarrhea     (Consider location/radiation/quality/duration/timing/severity/associated sxs/prior Treatment) HPI 81 year old man history of atrial fibrillation and hypertension who presents today with complaints of nausea vomiting and diarrhea 4 today. States he was in his normal state of health this morning and ate a sausage biscuit. Later in the morning he began feeling nauseated and had the episodes of diarrhea with one episode of vomiting. He has been able to take fluids but has not eaten since that time. Patient denies pain to me. I note that the nurses notes states that he had mid abdominal pain radiating to esophagus. I returned to the room and asked the patient and his wife again and they state that he has not had any pain with this. He reports no recent antibiotics use. Past Medical History  Diagnosis Date  . ASCVD (arteriosclerotic cardiovascular disease)     CABG 11/06; MUGA 11/08 - 44%; 3/07 mildly decreased EF on echo  . Atrial fibrillation     Initially occurred following CABG; event recorder in 11/08 - no AF; recurred 08/2009  . Anticoagulant disorder   . Hypertension   . Hyperlipidemia   . Overweight(278.02)   . Nephrolithiasis   . CHF (congestive heart failure)   . Coronary artery disease    Past Surgical History  Procedure Laterality Date  . Coronary artery bypass graft  04/2005  . Colonoscopy  2008  . Angioplasty  1991   Family History  Problem Relation Age of Onset  . Hypertension Father   . Heart attack Father   . Diabetes Brother    History  Substance Use Topics  . Smoking status: Never Smoker   . Smokeless tobacco: Not on file  . Alcohol Use: No    Review of Systems  All other systems reviewed and are negative.     Allergies  Ibuprofen  Home Medications   Prior to  Admission medications   Medication Sig Start Date End Date Taking? Authorizing Provider  amLODipine-benazepril (LOTREL) 10-40 MG per capsule TAKE 1 CAPSULE BY MOUTH ONCE A DAY. 06/12/14  Yes Antoine Poche, MD  atorvastatin (LIPITOR) 40 MG tablet Take 40 mg by mouth daily. 12/26/14  Yes Historical Provider, MD  furosemide (LASIX) 20 MG tablet TAKE (1) TABLET BY MOUTH ONCE DAILY. 09/18/14  Yes Antoine Poche, MD  metoprolol succinate (TOPROL-XL) 50 MG 24 hr tablet TAKE 1 TABLET ONCE DAILY AFTER A MEAL. 12/18/14  Yes Antoine Poche, MD  warfarin (COUMADIN) 5 MG tablet Take coumadin 1/2 tablet daily except 1 tablet on Sundays and Thursdays Patient taking differently: Take 2.5-5 mg by mouth daily. Patient takes  ON SUNDAYS MONDAYS, WEDNESDAYS, AND FRIDAYS THEN TAKE 1/2 TABLET ON ALL OTHER DAYS 08/09/13  Yes Antoine Poche, MD  atorvastatin (LIPITOR) 80 MG tablet Take 1 tablet (80 mg total) by mouth daily. 01/02/15   Antoine Poche, MD   BP 131/67 mmHg  Pulse 80  Temp(Src) 101 F (38.3 C) (Rectal)  Resp 18  Ht 6' (1.829 m)  Wt 260 lb (117.935 kg)  BMI 35.25 kg/m2  SpO2 96% Physical Exam  Constitutional: He is oriented to person, place, and time. He appears well-developed and well-nourished.  Morbidly obese  HENT:  Head: Normocephalic and atraumatic.  Right Ear: External ear normal.  Left Ear: External ear normal.  Nose: Nose normal.  Mouth/Throat: Oropharynx is clear and moist.  Eyes: Conjunctivae and EOM are normal. Pupils are equal, round, and reactive to light.  Neck: Normal range of motion. Neck supple.  Cardiovascular: Normal rate, regular rhythm, normal heart sounds and intact distal pulses.   Pulmonary/Chest: Effort normal and breath sounds normal. No respiratory distress. He has no wheezes. He exhibits no tenderness.  Abdominal: Soft. Bowel sounds are normal. He exhibits no distension and no mass. There is no tenderness. There is no guarding.  Musculoskeletal: Normal  range of motion.  Neurological: He is alert and oriented to person, place, and time. He has normal reflexes. He exhibits normal muscle tone. Coordination normal.  Skin: Skin is warm and dry.  Psychiatric: He has a normal mood and affect. His behavior is normal. Judgment and thought content normal.  Nursing note and vitals reviewed.   ED Course  Procedures (including critical care time) Labs Review Labs Reviewed  CBC WITH DIFFERENTIAL/PLATELET - Abnormal; Notable for the following:    Neutrophils Relative % 92 (*)    Neutro Abs 9.5 (*)    Lymphocytes Relative 4 (*)    Lymphs Abs 0.5 (*)    All other components within normal limits  COMPREHENSIVE METABOLIC PANEL - Abnormal; Notable for the following:    Glucose, Bld 128 (*)    BUN 23 (*)    Calcium 8.3 (*)    All other components within normal limits  PROTIME-INR - Abnormal; Notable for the following:    Prothrombin Time 25.7 (*)    INR 2.38 (*)    All other components within normal limits  URINALYSIS, ROUTINE W REFLEX MICROSCOPIC (NOT AT [facility])  POC OCCULT BLOOD, ED  I-STAT CG4 LACTIC ACID, ED    Imaging Review No results found.   EKG Interpretation None      MDM   Final diagnoses:  Nausea vomiting and diarrhea  Gastroenteritis  Elevated lactic acid level    After my initial exam, labs were ordered, and rectal temperature was ordered. Patient had rectal temperature obtained and was 101 which I'm noting now at 2038. Lactic acid is added on. Patient is staying comfortably. He has not had any vomiting since my initial valuation and oral fluid challenge has been ordered. He received 500 mL normal saline. Initial lactic acid is 2.22. Plan hospitalization for ongoing evaluation and treatment.    Margarita Grizzleanielle Dream Harman, MD 01/06/15 2053

## 2015-01-06 NOTE — Consult Note (Addendum)
History and Physical  NAZIER NEYHART ZOX:[id number] DOB: 09-02-1930 DOA: 01/06/2015  Referring physician: Dr Rosalia Hammers, ED physician PCP: Lilyan Punt, MD   Chief Complaint: Diarrhea  HPI: [name] is a 81 y.o. male  With a history of atrial fibrillation on anticoagulation, hypertension, hyperlipidemia, CHF. The patient presents to the hospital with nausea, one episode of emesis, and 4 episodes of watery nonbloody diarrhea that started approximately 10am.  The patient ate breakfast morning with his wife - they both had the same meal at a restaurant. The patient symptoms started a couple hours afterwards. No palliating or provoking factors. Patient did have some stomach cramping and nausea. He has emerged department the patient has been tolerating sips of fluids and his abdominal discomfort has decreased significantly. Spite having a rectal temperature, the patient denies fevers and chills.  He denies sick contacts. His wife is asymptomatic. No recent antibiotic use.  Review of Systems:   Pt complains of abdominal cramping, nausea.  Pt denies any fever, chills, chest pain, shortness of breath, chest pain, palpitations, melanoma, rectal bleeding, hematemesis, cough, headache, blurred visions.  Review of systems are otherwise negative  Past Medical History  Diagnosis Date  . ASCVD (arteriosclerotic cardiovascular disease)     CABG 11/06; MUGA 11/08 - 44%; 3/07 mildly decreased EF on echo  . Atrial fibrillation     Initially occurred following CABG; event recorder in 11/08 - no AF; recurred 08/2009  . Anticoagulant disorder   . Hypertension   . Hyperlipidemia   . Overweight(278.02)   . Nephrolithiasis   . CHF (congestive heart failure)   . Coronary artery disease    Past Surgical History  Procedure Laterality Date  . Coronary artery bypass graft  04/2005  . Colonoscopy  2008  . Angioplasty  1991   Social History:  reports that he has never smoked. He does not have any smokeless  tobacco history on file. He reports that he does not drink alcohol or use illicit drugs. Patient lives at home & is able to participate in activities of daily living  Allergies  Allergen Reactions  . Ibuprofen Rash    REACTION: skin rash    Family History  Problem Relation Age of Onset  . Hypertension Father   . Heart attack Father   . Diabetes Brother      Prior to Admission medications   Medication Sig Start Date End Date Taking? Authorizing Provider  amLODipine-benazepril (LOTREL) 10-40 MG per capsule TAKE 1 CAPSULE BY MOUTH ONCE A DAY. 06/12/14  Yes Antoine Poche, MD  atorvastatin (LIPITOR) 40 MG tablet Take 40 mg by mouth daily. 12/26/14  Yes Historical Provider, MD  furosemide (LASIX) 20 MG tablet TAKE (1) TABLET BY MOUTH ONCE DAILY. 09/18/14  Yes Antoine Poche, MD  metoprolol succinate (TOPROL-XL) 50 MG 24 hr tablet TAKE 1 TABLET ONCE DAILY AFTER A MEAL. 12/18/14  Yes Antoine Poche, MD  warfarin (COUMADIN) 5 MG tablet Take coumadin 1/2 tablet daily except 1 tablet on Sundays and Thursdays Patient taking differently: Take 2.5-5 mg by mouth daily. Patient takes  ON SUNDAYS MONDAYS, WEDNESDAYS, AND FRIDAYS THEN TAKE 1/2 TABLET ON ALL OTHER DAYS 08/09/13  Yes Antoine Poche, MD  atorvastatin (LIPITOR) 80 MG tablet Take 1 tablet (80 mg total) by mouth daily. 01/02/15   Antoine Poche, MD    Physical Exam: BP 144/81 mmHg  Pulse 70  Temp(Src) 101 F (38.3 C) (Rectal)  Resp 18  Ht 6' (1.829  m)  Wt 117.935 kg (260 lb)  BMI 35.25 kg/m2  SpO2 96%  General: Older Caucasian gentleman. Awake and alert and oriented x3. No acute cardiopulmonary distress.  Eyes: Pupils equal, round, reactive to light. Extraocular muscles are intact. Sclerae anicteric and noninjected.  ENT: Moist mucosal membranes. No mucosal lesions. r  Neck: Neck supple without lymphadenopathy. No carotid bruits. No masses palpated.  Cardiovascular: Regular rate with normal S1-S2 sounds. No murmurs,  rubs, gallops auscultated. No JVD.  Respiratory: Good respiratory effort with no wheezes, rales, rhonchi. Lungs clear to auscultation bilaterally.  Abdomen: Soft, slight left upper quadrant tenderness, nondistended. Active bowel sounds. No masses or hepatosplenomegaly  Skin: Dry, warm to touch. 2+ dorsalis pedis and radial pulses. Musculoskeletal: No calf or leg pain. All major joints not erythematous nontender.  Psychiatric: Intact judgment and insight.  Neurologic: No focal neurological deficits. Cranial nerves II through XII are grossly intact.           Labs on Admission:  Basic Metabolic Panel:  Recent Labs Lab 01/06/15 1919  NA 137  K 4.2  CL 107  CO2 22  GLUCOSE 128*  BUN 23*  CREATININE 1.01  CALCIUM 8.3*   Liver Function Tests:  Recent Labs Lab 01/06/15 1919  AST 20  ALT 17  ALKPHOS 87  BILITOT 1.1  PROT 7.4  ALBUMIN 3.9   No results for input(s): LIPASE, AMYLASE in the last 168 hours. No results for input(s): AMMONIA in the last 168 hours. CBC:  Recent Labs Lab 01/06/15 1919  WBC 10.5  NEUTROABS 9.5*  HGB 15.3  HCT 44.2  MCV 95.5  PLT 189   Cardiac Enzymes: No results for input(s): CKTOTAL, CKMB, CKMBINDEX, TROPONINI in the last 168 hours.  BNP (last 3 results) No results for input(s): BNP in the last 8760 hours.  ProBNP (last 3 results) No results for input(s): PROBNP in the last 8760 hours.  CBG: No results for input(s): GLUCAP in the last 168 hours.  Radiological Exams on Admission: No results found.  Assessment/Plan Present on Admission:  . Viral gastroenteritis  This patient was discussed with the ED physician, including pertinent vitals, physical exam findings, labs, and imaging.  We also discussed care given by the ED provider.  The patient has gastroenteritis. Despite the patient's slightly elevated lactic acid at 2.22, the patient demonstrates no other evidence of impaired oxygenation, hypoperfusion, or end organ damage. The  patient's white count is 10.5, which would be consistent with his viral gastroenteritis. I do not believe that the patient is septic or in need of IV antibiotics. His symptoms are improving, he is tolerating sips of fluids and had 500 mL's of fluid bolus. He has not had any episodes of diarrhea since being in the emergency department. I discussed with the patient options of care, which included observation the hospital overnight for gentle IV fluid hydration versus returning to home and treating the gastroenteritis symptomatically with Tylenol, sips of fluid, gradual diet advancement, and over-the-counter medications such as Pepto-Bismol or Imodium for the diarrhea. I do believe that the latter option is completely reasonable, especially given that the patient is otherwise asymptomatic and demonstrates improvement throughout his course in emergency department. The patient and his wife feel that he would fare well at home and requests to be discharged. They live in close proximity to the hospital - actually one block away. Gave him strict instructions to return if nausea and vomiting returned, fevers persistent despite Tylenol, or if his diarrhea persists. I  recommend following up with his primary care physician sometime next week.  Disposition: Discharged home Thank you for allowing me to participate in the care of this patient   Levie Heritage, DO [facility] Pager [phone]

## 2015-01-06 NOTE — ED Notes (Addendum)
Vomiting x 1, diarrhea x 4 today. Started this morning. Has mid abdominal pain which radiates into esophagus.  Denies  urinary symptoms or sick contacts.

## 2015-01-24 ENCOUNTER — Ambulatory Visit (INDEPENDENT_AMBULATORY_CARE_PROVIDER_SITE_OTHER): Payer: Medicare Other | Admitting: *Deleted

## 2015-01-24 DIAGNOSIS — I4891 Unspecified atrial fibrillation: Secondary | ICD-10-CM

## 2015-01-24 DIAGNOSIS — Z5181 Encounter for therapeutic drug level monitoring: Secondary | ICD-10-CM | POA: Diagnosis not present

## 2015-01-24 DIAGNOSIS — Z7901 Long term (current) use of anticoagulants: Secondary | ICD-10-CM

## 2015-01-24 LAB — POCT INR: INR: 3.4

## 2015-02-10 ENCOUNTER — Other Ambulatory Visit: Payer: Self-pay | Admitting: Cardiology

## 2015-02-12 ENCOUNTER — Ambulatory Visit (INDEPENDENT_AMBULATORY_CARE_PROVIDER_SITE_OTHER): Payer: Medicare Other | Admitting: *Deleted

## 2015-02-12 DIAGNOSIS — I4891 Unspecified atrial fibrillation: Secondary | ICD-10-CM

## 2015-02-12 DIAGNOSIS — Z7901 Long term (current) use of anticoagulants: Secondary | ICD-10-CM | POA: Diagnosis not present

## 2015-02-12 DIAGNOSIS — Z5181 Encounter for therapeutic drug level monitoring: Secondary | ICD-10-CM | POA: Diagnosis not present

## 2015-02-12 LAB — POCT INR: INR: 1.5

## 2015-02-19 ENCOUNTER — Ambulatory Visit (INDEPENDENT_AMBULATORY_CARE_PROVIDER_SITE_OTHER): Payer: Medicare Other | Admitting: *Deleted

## 2015-02-19 DIAGNOSIS — Z7901 Long term (current) use of anticoagulants: Secondary | ICD-10-CM | POA: Diagnosis not present

## 2015-02-19 DIAGNOSIS — I4891 Unspecified atrial fibrillation: Secondary | ICD-10-CM

## 2015-02-19 DIAGNOSIS — Z5181 Encounter for therapeutic drug level monitoring: Secondary | ICD-10-CM | POA: Diagnosis not present

## 2015-02-19 LAB — POCT INR: INR: 3

## 2015-03-19 ENCOUNTER — Ambulatory Visit (INDEPENDENT_AMBULATORY_CARE_PROVIDER_SITE_OTHER): Payer: Medicare Other | Admitting: *Deleted

## 2015-03-19 DIAGNOSIS — Z5181 Encounter for therapeutic drug level monitoring: Secondary | ICD-10-CM | POA: Diagnosis not present

## 2015-03-19 DIAGNOSIS — I4891 Unspecified atrial fibrillation: Secondary | ICD-10-CM | POA: Diagnosis not present

## 2015-03-19 DIAGNOSIS — Z7901 Long term (current) use of anticoagulants: Secondary | ICD-10-CM | POA: Diagnosis not present

## 2015-03-19 LAB — POCT INR: INR: 3

## 2015-03-19 MED ORDER — METOPROLOL SUCCINATE ER 50 MG PO TB24: ORAL_TABLET | ORAL | Status: DC

## 2015-03-19 MED ORDER — WARFARIN SODIUM 5 MG PO TABS: ORAL_TABLET | ORAL | Status: DC

## 2015-04-02 ENCOUNTER — Encounter: Payer: Medicare Other | Admitting: Family Medicine

## 2015-04-02 ENCOUNTER — Other Ambulatory Visit: Payer: Self-pay | Admitting: Cardiology

## 2015-04-10 ENCOUNTER — Ambulatory Visit (INDEPENDENT_AMBULATORY_CARE_PROVIDER_SITE_OTHER): Payer: Medicare Other | Admitting: Family Medicine

## 2015-04-10 ENCOUNTER — Encounter: Payer: Self-pay | Admitting: Family Medicine

## 2015-04-10 VITALS — BP 118/68 | Ht 68.25 in | Wt 258.0 lb

## 2015-04-10 DIAGNOSIS — Z0000 Encounter for general adult medical examination without abnormal findings: Secondary | ICD-10-CM

## 2015-04-10 DIAGNOSIS — R7303 Prediabetes: Secondary | ICD-10-CM

## 2015-04-10 DIAGNOSIS — Z79899 Other long term (current) drug therapy: Secondary | ICD-10-CM | POA: Diagnosis not present

## 2015-04-10 DIAGNOSIS — E785 Hyperlipidemia, unspecified: Secondary | ICD-10-CM | POA: Diagnosis not present

## 2015-04-10 DIAGNOSIS — Z23 Encounter for immunization: Secondary | ICD-10-CM | POA: Diagnosis not present

## 2015-04-10 NOTE — Progress Notes (Signed)
   Subjective:    Patient ID: [name], male    DOB: 02/25/1931, 81 y.o.   MRN: [id number]  HPI AWV- Annual Wellness Visit  The patient was seen for their annual wellness visit. The patient's past medical history, surgical history, and family history were reviewed. Pertinent vaccines were reviewed ( tetanus, pneumonia, shingles, flu) The patient's medication list was reviewed and updated.  The height and weight were entered. The patient's current BMI is: 38.94  Cognitive screening was completed. Outcome of Mini - Cog: pass  Falls within the past 6 months: two falls in the past 6 months. One time right leg gave out and the other time just lost balance.   Current tobacco usage: none (All patients who use tobacco were given written and verbal information on quitting)  Recent listing of emergency department/hospitalizations over the past year were reviewed.  current specialist the patient sees on a regular basis: dr branch- cardiologist.   Concerns about left leg pain. Started about 2 months ago. Pt thinks it may be atorvastatin. Pain started after dr branch doubled the med.   Medicare annual wellness visit patient questionnaire was reviewed.  A written screening schedule for the patient for the next 5-10 years was given. Appropriate discussion of followup regarding next visit was discussed.       Review of Systems  Constitutional: Negative for fever, activity change and appetite change.  HENT: Negative for congestion and rhinorrhea.   Eyes: Negative for discharge.  Respiratory: Negative for cough and wheezing.   Cardiovascular: Negative for chest pain.  Gastrointestinal: Negative for vomiting, abdominal pain and blood in stool.  Genitourinary: Negative for frequency and difficulty urinating.  Musculoskeletal: Negative for neck pain.  Skin: Negative for rash.  Allergic/Immunologic: Negative for environmental allergies and food allergies.  Neurological: Negative for  weakness and headaches.  Psychiatric/Behavioral: Negative for agitation.       Objective:   Physical Exam  Constitutional: He appears well-developed and well-nourished.  HENT:  Head: Normocephalic and atraumatic.  Right Ear: External ear normal.  Left Ear: External ear normal.  Nose: Nose normal.  Mouth/Throat: Oropharynx is clear and moist.  Eyes: EOM are normal. Pupils are equal, round, and reactive to light.  Neck: Normal range of motion. Neck supple. No thyromegaly present.  Cardiovascular: Normal rate, regular rhythm and normal heart sounds.   No murmur heard. Pulmonary/Chest: Effort normal and breath sounds normal. No respiratory distress. He has no wheezes.  Abdominal: Soft. Bowel sounds are normal. He exhibits no distension and no mass. There is no tenderness.  Genitourinary: Penis normal.  Musculoskeletal: Normal range of motion. He exhibits no edema.  Lymphadenopathy:    He has no cervical adenopathy.  Neurological: He is alert. He exhibits normal muscle tone.  Skin: Skin is warm and dry. No erythema.  Psychiatric: He has a normal mood and affect. His behavior is normal. Judgment normal.          Assessment & Plan:  The patient was cautioned to watch starches in the diet. He was encouraged to eat healthy stays physically active as his health will allow Safety dietary measures reviewed No recent falls Overall doing well Follows with cardiology for his medicines He was encouraged to follow-up with [place] within 6 months.

## 2015-04-12 ENCOUNTER — Encounter: Payer: Self-pay | Admitting: Family Medicine

## 2015-04-12 LAB — HEMOGLOBIN A1C
ESTIMATED AVERAGE GLUCOSE: 131 mg/dL
Hgb A1c MFr Bld: 6.2 % — ABNORMAL HIGH (ref 4.8–5.6)

## 2015-04-12 LAB — BASIC METABOLIC PANEL
BUN / CREAT RATIO: 16 (ref 10–22)
BUN: 16 mg/dL (ref 8–27)
CHLORIDE: 102 mmol/L (ref 97–108)
CO2: 23 mmol/L (ref 18–29)
Calcium: 9.2 mg/dL (ref 8.6–10.2)
Creatinine, Ser: 1.01 mg/dL (ref 0.76–1.27)
GFR calc Af Amer: 80 mL/min/{1.73_m2} (ref >59)
GFR calc non Af Amer: 69 mL/min/{1.73_m2} (ref >59)
GLUCOSE: 117 mg/dL — AB (ref 65–99)
Potassium: 4.9 mmol/L (ref 3.5–5.2)
Sodium: 140 mmol/L (ref 134–144)

## 2015-04-12 LAB — LIPID PANEL
CHOL/HDL RATIO: 2.7 ratio (ref 0.0–5.0)
Cholesterol, Total: 132 mg/dL (ref 100–199)
HDL: 49 mg/dL (ref >39)
LDL Calculated: 65 mg/dL (ref 0–99)
Triglycerides: 89 mg/dL (ref 0–149)
VLDL Cholesterol Cal: 18 mg/dL (ref 5–40)

## 2015-04-16 ENCOUNTER — Ambulatory Visit (INDEPENDENT_AMBULATORY_CARE_PROVIDER_SITE_OTHER): Payer: Medicare Other | Admitting: *Deleted

## 2015-04-16 DIAGNOSIS — Z7901 Long term (current) use of anticoagulants: Secondary | ICD-10-CM

## 2015-04-16 DIAGNOSIS — I4891 Unspecified atrial fibrillation: Secondary | ICD-10-CM

## 2015-04-16 DIAGNOSIS — Z5181 Encounter for therapeutic drug level monitoring: Secondary | ICD-10-CM

## 2015-04-16 LAB — POCT INR: INR: 2.2

## 2015-05-17 ENCOUNTER — Other Ambulatory Visit: Payer: Self-pay | Admitting: Cardiology

## 2015-05-23 ENCOUNTER — Ambulatory Visit (INDEPENDENT_AMBULATORY_CARE_PROVIDER_SITE_OTHER): Payer: Medicare Other | Admitting: *Deleted

## 2015-05-23 DIAGNOSIS — Z7901 Long term (current) use of anticoagulants: Secondary | ICD-10-CM | POA: Diagnosis not present

## 2015-05-23 DIAGNOSIS — Z5181 Encounter for therapeutic drug level monitoring: Secondary | ICD-10-CM

## 2015-05-23 DIAGNOSIS — I4891 Unspecified atrial fibrillation: Secondary | ICD-10-CM

## 2015-05-23 LAB — POCT INR: INR: 2.7

## 2015-07-04 ENCOUNTER — Ambulatory Visit (INDEPENDENT_AMBULATORY_CARE_PROVIDER_SITE_OTHER): Payer: Medicare Other | Admitting: *Deleted

## 2015-07-04 DIAGNOSIS — Z5181 Encounter for therapeutic drug level monitoring: Secondary | ICD-10-CM | POA: Diagnosis not present

## 2015-07-04 DIAGNOSIS — Z7901 Long term (current) use of anticoagulants: Secondary | ICD-10-CM | POA: Diagnosis not present

## 2015-07-04 DIAGNOSIS — I4891 Unspecified atrial fibrillation: Secondary | ICD-10-CM

## 2015-07-04 LAB — POCT INR: INR: 2.6

## 2015-08-15 ENCOUNTER — Ambulatory Visit (INDEPENDENT_AMBULATORY_CARE_PROVIDER_SITE_OTHER): Payer: Medicare Other | Admitting: *Deleted

## 2015-08-15 DIAGNOSIS — Z7901 Long term (current) use of anticoagulants: Secondary | ICD-10-CM | POA: Diagnosis not present

## 2015-08-15 DIAGNOSIS — Z5181 Encounter for therapeutic drug level monitoring: Secondary | ICD-10-CM | POA: Diagnosis not present

## 2015-08-15 DIAGNOSIS — I4891 Unspecified atrial fibrillation: Secondary | ICD-10-CM

## 2015-08-15 LAB — POCT INR: INR: 2.5

## 2015-09-10 ENCOUNTER — Other Ambulatory Visit: Payer: Self-pay | Admitting: Cardiology

## 2015-09-26 ENCOUNTER — Ambulatory Visit (INDEPENDENT_AMBULATORY_CARE_PROVIDER_SITE_OTHER): Payer: Medicare Other | Admitting: *Deleted

## 2015-09-26 DIAGNOSIS — I4891 Unspecified atrial fibrillation: Secondary | ICD-10-CM | POA: Diagnosis not present

## 2015-09-26 DIAGNOSIS — Z7901 Long term (current) use of anticoagulants: Secondary | ICD-10-CM | POA: Diagnosis not present

## 2015-09-26 DIAGNOSIS — Z5181 Encounter for therapeutic drug level monitoring: Secondary | ICD-10-CM | POA: Diagnosis not present

## 2015-09-26 LAB — POCT INR: INR: 2

## 2015-10-23 ENCOUNTER — Other Ambulatory Visit: Payer: Self-pay | Admitting: Cardiology

## 2015-11-07 ENCOUNTER — Ambulatory Visit (INDEPENDENT_AMBULATORY_CARE_PROVIDER_SITE_OTHER): Payer: Medicare Other | Admitting: *Deleted

## 2015-11-07 DIAGNOSIS — Z7901 Long term (current) use of anticoagulants: Secondary | ICD-10-CM

## 2015-11-07 DIAGNOSIS — I4891 Unspecified atrial fibrillation: Secondary | ICD-10-CM | POA: Diagnosis not present

## 2015-11-07 DIAGNOSIS — Z5181 Encounter for therapeutic drug level monitoring: Secondary | ICD-10-CM | POA: Diagnosis not present

## 2015-11-07 LAB — POCT INR: INR: 2.4

## 2015-12-17 ENCOUNTER — Other Ambulatory Visit: Payer: Self-pay | Admitting: Cardiology

## 2015-12-19 ENCOUNTER — Ambulatory Visit (INDEPENDENT_AMBULATORY_CARE_PROVIDER_SITE_OTHER): Payer: Medicare Other | Admitting: *Deleted

## 2015-12-19 DIAGNOSIS — Z7901 Long term (current) use of anticoagulants: Secondary | ICD-10-CM

## 2015-12-19 DIAGNOSIS — Z5181 Encounter for therapeutic drug level monitoring: Secondary | ICD-10-CM

## 2015-12-19 DIAGNOSIS — I4891 Unspecified atrial fibrillation: Secondary | ICD-10-CM | POA: Diagnosis not present

## 2015-12-19 LAB — POCT INR: INR: 3.4

## 2015-12-21 ENCOUNTER — Encounter: Payer: Self-pay | Admitting: Cardiology

## 2016-01-15 ENCOUNTER — Ambulatory Visit: Payer: Medicare Other | Admitting: Cardiology

## 2016-01-16 ENCOUNTER — Other Ambulatory Visit: Payer: Self-pay | Admitting: Cardiology

## 2016-01-16 ENCOUNTER — Ambulatory Visit (INDEPENDENT_AMBULATORY_CARE_PROVIDER_SITE_OTHER): Payer: Medicare Other | Admitting: *Deleted

## 2016-01-16 DIAGNOSIS — I4891 Unspecified atrial fibrillation: Secondary | ICD-10-CM

## 2016-01-16 DIAGNOSIS — Z7901 Long term (current) use of anticoagulants: Secondary | ICD-10-CM

## 2016-01-16 DIAGNOSIS — Z5181 Encounter for therapeutic drug level monitoring: Secondary | ICD-10-CM

## 2016-01-16 LAB — POCT INR: INR: 2.5

## 2016-01-21 ENCOUNTER — Ambulatory Visit (INDEPENDENT_AMBULATORY_CARE_PROVIDER_SITE_OTHER): Payer: Medicare Other | Admitting: Cardiology

## 2016-01-21 ENCOUNTER — Encounter (INDEPENDENT_AMBULATORY_CARE_PROVIDER_SITE_OTHER): Payer: Self-pay

## 2016-01-21 ENCOUNTER — Encounter: Payer: Self-pay | Admitting: Cardiology

## 2016-01-21 VITALS — BP 124/62 | HR 69 | Ht 71.0 in | Wt 256.0 lb

## 2016-01-21 DIAGNOSIS — I4891 Unspecified atrial fibrillation: Secondary | ICD-10-CM

## 2016-01-21 DIAGNOSIS — I10 Essential (primary) hypertension: Secondary | ICD-10-CM

## 2016-01-21 DIAGNOSIS — I2510 Atherosclerotic heart disease of native coronary artery without angina pectoris: Secondary | ICD-10-CM

## 2016-01-21 DIAGNOSIS — E785 Hyperlipidemia, unspecified: Secondary | ICD-10-CM

## 2016-01-21 DIAGNOSIS — R0602 Shortness of breath: Secondary | ICD-10-CM

## 2016-01-21 NOTE — Patient Instructions (Signed)

## 2016-01-21 NOTE — Progress Notes (Signed)
Clinical Summary Mr. Allende is a 82 y.o.male seen today for follow up of the following medical problems.   1. CAD/ICM  - prior CABG in Nov 2006 at [facility]  - denies any chest pain. Increased SOB over the last week. DOE at 100 yards. No recent LE edema. - no orthopnea, no PND   2. Afib  - no palpitations. - compliant with coumadin, no recent bleeding issues - not interested in NOACs  3. HTN  - does not check at home  - compliant with meds   4. HL  - compliant with statin - upcoming labs with pcp    SH: spends much of his time his time taking care of his wife   Past Medical History:  Diagnosis Date  . Anticoagulant disorder (HCC)   . ASCVD (arteriosclerotic cardiovascular disease)    CABG 11/06; MUGA 11/08 - 44%; 3/07 mildly decreased EF on echo  . Atrial fibrillation (HCC)    Initially occurred following CABG; event recorder in 11/08 - no AF; recurred 08/2009  . CHF (congestive heart failure) (HCC)   . Coronary artery disease   . Hyperlipidemia   . Hypertension   . Nephrolithiasis   . Overweight(278.02)      Allergies  Allergen Reactions  . Ibuprofen Rash    REACTION: skin rash     Current Outpatient Prescriptions  Medication Sig Dispense Refill  . amLODipine-benazepril (LOTREL) 10-40 MG capsule TAKE 1 CAPSULE BY MOUTH ONCE A DAY. 30 capsule 11  . atorvastatin (LIPITOR) 80 MG tablet Take 1 tablet (80 mg total) by mouth daily. 90 tablet 3  . furosemide (LASIX) 20 MG tablet TAKE (1) TABLET BY MOUTH ONCE DAILY. 30 tablet 11  . metoprolol succinate (TOPROL-XL) 50 MG 24 hr tablet TAKE 1 TABLET ONCE DAILY IMMEDIATELY FOLLOWING A MEAL. 30 tablet 6  . warfarin (COUMADIN) 5 MG tablet TAKE 1 TABLET ON MONDAYS, WEDNESDAYS AND FRIDAYS, THEN TAKE 1/2 TABLET EVERY OTHER DAY. 30 tablet 3   No current facility-administered medications for this visit.      Past Surgical History:  Procedure Laterality Date  . ANGIOPLASTY  1991  . COLONOSCOPY  2008  .  CORONARY ARTERY BYPASS GRAFT  04/2005     Allergies  Allergen Reactions  . Ibuprofen Rash    REACTION: skin rash      Family History  Problem Relation Age of Onset  . Hypertension Father   . Heart attack Father   . Diabetes Brother      Social History Mr. Kluver reports that he has never smoked. He does not have any smokeless tobacco history on file. Mr. Moga reports that he does not drink alcohol.   Review of Systems CONSTITUTIONAL: No weight loss, fever, chills, weakness or fatigue.  HEENT: Eyes: No visual loss, blurred vision, double vision or yellow sclerae.No hearing loss, sneezing, congestion, runny nose or sore throat.  SKIN: No rash or itching.  CARDIOVASCULAR: per HPI RESPIRATORY: No shortness of breath, cough or sputum.  GASTROINTESTINAL: No anorexia, nausea, vomiting or diarrhea. No abdominal pain or blood.  GENITOURINARY: No burning on urination, no polyuria NEUROLOGICAL: No headache, dizziness, syncope, paralysis, ataxia, numbness or tingling in the extremities. No change in bowel or bladder control.  MUSCULOSKELETAL: No muscle, back pain, joint pain or stiffness.  LYMPHATICS: No enlarged nodes. No history of splenectomy.  PSYCHIATRIC: No history of depression or anxiety.  ENDOCRINOLOGIC: No reports of sweating, cold or heat intolerance. No polyuria or polydipsia.  [name]  Physical Examination Vitals:   01/21/16 1012  BP: 124/62  Pulse: 69   Vitals:   01/21/16 1012  Weight: 256 lb (116.1 kg)  Height: 5\' 11"  (1.803 m)    Gen: resting comfortably, no acute distress HEENT: no scleral icterus, pupils equal round and reactive, no palptable cervical adenopathy,  CV: RRR, no m/r/g, no jvd Resp: Clear to auscultation bilaterally GI: abdomen is soft, non-tender, non-distended, normal bowel sounds, no hepatosplenomegaly MSK: extremities are warm, no edema.  Skin: warm, no rash Neuro:  no focal deficits Psych: appropriate affect   Diagnostic  Studies July 2011 Echo: mild to moderately decreased LVEF (no LVEF given), apex akinetic, severe LVH, severe LAE   05/05/13 Clinic EKG: afib at 58 bpm, LAD, anteroseptal and anterolateral Q waves, inferior Q waves   05/2013 Echo  Technically difficult study, severe LVH, elevated LA pressure. Difficult to evalute LV function even with contrast, appears low normal to normal.     Assessment and Plan   1. CAD - no current chest pain, some increased SOB at mild to moderate exertion - we will check echo - continue current meds  2. Afib  - denies any current symptoms  - EKG in clinic shows afib rate controlled - CHADS2Vasc score is 4, continue anticoag  3. HTN  - at goal today, he will continue current meds  4. Hyperlipidemia  - f/u pcp labs. Continue staitn   F/u 3 months     Antoine Poche, M.D.

## 2016-01-29 ENCOUNTER — Ambulatory Visit (HOSPITAL_COMMUNITY)
Admission: RE | Admit: 2016-01-29 | Discharge: 2016-01-29 | Disposition: A | Discharge status: Home / Self Care | Payer: Medicare Other | Source: Ambulatory Visit | Attending: Cardiology | Admitting: Cardiology

## 2016-01-29 DIAGNOSIS — I071 Rheumatic tricuspid insufficiency: Secondary | ICD-10-CM | POA: Diagnosis not present

## 2016-01-29 DIAGNOSIS — I351 Nonrheumatic aortic (valve) insufficiency: Secondary | ICD-10-CM | POA: Diagnosis not present

## 2016-01-29 DIAGNOSIS — E785 Hyperlipidemia, unspecified: Secondary | ICD-10-CM | POA: Diagnosis not present

## 2016-01-29 DIAGNOSIS — I340 Nonrheumatic mitral (valve) insufficiency: Secondary | ICD-10-CM | POA: Diagnosis not present

## 2016-01-29 DIAGNOSIS — Z6835 Body mass index (BMI) 35.0-35.9, adult: Secondary | ICD-10-CM | POA: Diagnosis not present

## 2016-01-29 DIAGNOSIS — R0602 Shortness of breath: Secondary | ICD-10-CM | POA: Insufficient documentation

## 2016-01-29 DIAGNOSIS — I77810 Thoracic aortic ectasia: Secondary | ICD-10-CM | POA: Diagnosis not present

## 2016-01-29 DIAGNOSIS — I4891 Unspecified atrial fibrillation: Secondary | ICD-10-CM | POA: Insufficient documentation

## 2016-01-29 DIAGNOSIS — I119 Hypertensive heart disease without heart failure: Secondary | ICD-10-CM | POA: Insufficient documentation

## 2016-01-29 DIAGNOSIS — E669 Obesity, unspecified: Secondary | ICD-10-CM | POA: Diagnosis not present

## 2016-01-29 MED ORDER — PERFLUTREN LIPID MICROSPHERE
1.0000 mL | INTRAVENOUS | Status: AC | PRN
  Administered 2016-01-29: 1 mL via INTRAVENOUS
  Administered 2016-01-29: 2 mL via INTRAVENOUS

## 2016-01-29 NOTE — Progress Notes (Signed)
*  PRELIMINARY RESULTS* Echocardiogram 2D Echocardiogram has been performed with Definity.  Stacey Drain 01/29/2016, 3:20 PM

## 2016-02-13 ENCOUNTER — Ambulatory Visit (INDEPENDENT_AMBULATORY_CARE_PROVIDER_SITE_OTHER): Payer: Medicare Other | Admitting: *Deleted

## 2016-02-13 DIAGNOSIS — I4891 Unspecified atrial fibrillation: Secondary | ICD-10-CM

## 2016-02-13 DIAGNOSIS — Z5181 Encounter for therapeutic drug level monitoring: Secondary | ICD-10-CM

## 2016-02-13 DIAGNOSIS — Z7901 Long term (current) use of anticoagulants: Secondary | ICD-10-CM

## 2016-02-13 LAB — POCT INR: INR: 2.4

## 2016-02-18 ENCOUNTER — Other Ambulatory Visit: Payer: Self-pay

## 2016-02-18 ENCOUNTER — Other Ambulatory Visit: Payer: Self-pay | Admitting: Cardiology

## 2016-02-18 MED ORDER — ATORVASTATIN CALCIUM 80 MG PO TABS: ORAL_TABLET | ORAL | 6 refills | Status: DC

## 2016-02-18 NOTE — Telephone Encounter (Signed)
Lipitor refilled.

## 2016-03-10 ENCOUNTER — Ambulatory Visit (INDEPENDENT_AMBULATORY_CARE_PROVIDER_SITE_OTHER): Payer: Medicare Other | Admitting: *Deleted

## 2016-03-10 DIAGNOSIS — Z7901 Long term (current) use of anticoagulants: Secondary | ICD-10-CM

## 2016-03-10 DIAGNOSIS — I4891 Unspecified atrial fibrillation: Secondary | ICD-10-CM | POA: Diagnosis not present

## 2016-03-10 DIAGNOSIS — Z5181 Encounter for therapeutic drug level monitoring: Secondary | ICD-10-CM

## 2016-03-10 LAB — POCT INR: INR: 3.4

## 2016-03-24 ENCOUNTER — Ambulatory Visit (INDEPENDENT_AMBULATORY_CARE_PROVIDER_SITE_OTHER): Payer: Medicare Other | Admitting: *Deleted

## 2016-03-24 DIAGNOSIS — Z5181 Encounter for therapeutic drug level monitoring: Secondary | ICD-10-CM | POA: Diagnosis not present

## 2016-03-24 DIAGNOSIS — I4891 Unspecified atrial fibrillation: Secondary | ICD-10-CM

## 2016-03-24 LAB — POCT INR: INR: 2.7

## 2016-04-14 ENCOUNTER — Other Ambulatory Visit: Payer: Self-pay | Admitting: Cardiology

## 2016-04-21 ENCOUNTER — Ambulatory Visit (INDEPENDENT_AMBULATORY_CARE_PROVIDER_SITE_OTHER): Payer: Medicare Other | Admitting: *Deleted

## 2016-04-21 DIAGNOSIS — I4891 Unspecified atrial fibrillation: Secondary | ICD-10-CM

## 2016-04-21 DIAGNOSIS — Z5181 Encounter for therapeutic drug level monitoring: Secondary | ICD-10-CM | POA: Diagnosis not present

## 2016-04-21 LAB — POCT INR: INR: 2.9

## 2016-05-01 ENCOUNTER — Ambulatory Visit: Payer: Medicare Other | Admitting: Cardiology

## 2016-05-01 DIAGNOSIS — I255 Ischemic cardiomyopathy: Secondary | ICD-10-CM | POA: Insufficient documentation

## 2016-05-01 NOTE — Progress Notes (Signed)
Cardiology Office Note    Date:  05/02/2016   ID:  Jimmy LeversJohn H Sanders, DOB Jul 28, 1930, MRN [id number]  PCP:  Lilyan PuntScott Luking, MD  Cardiologist: Dr. Wyline MoodBranch   Chief Complaint  Patient presents with  . Follow-up    History of Present Illness:  Jimmy LeversJohn H Chunn is a 82 y.o. male with history of CAD status post CABG 04/2005, ICM, atrial fibrillation CHADSVASC=4 on Coumadin, hypertension and hyperlipidemia.  Last saw Dr. Wyline MoodBranch 12/2015 complaining of increased shortness of breath with mild to moderate exertion. He ordered a 2-D echo 01/29/16 showed severe LVH, LVEF 45-50% with hypokinesis of the mid apical anterior septal and apical myocardium, indeterminate diastolic function in the setting of atrial fibrillation, mildly dilated aortic root with sclerotic aortic valve and trivial AS.  Patient comes in today for routine follow-up. He is under extreme amount of stress. His wife is blind, crippled and incontinent. He has a brother-in-law dying in hospice. He says it happened to do an awful lot. He denies any chest pain and has dyspnea on exertion has actually improved. His main complaint is fatigue from doing so much.    Past Medical History:  Diagnosis Date  . Anticoagulant disorder (HCC)   . ASCVD (arteriosclerotic cardiovascular disease)    CABG 11/06; MUGA 11/08 - 44%; 3/07 mildly decreased EF on echo  . Atrial fibrillation (HCC)    Initially occurred following CABG; event recorder in 11/08 - no AF; recurred 08/2009  . CHF (congestive heart failure) (HCC)   . Coronary artery disease   . Hyperlipidemia   . Hypertension   . Nephrolithiasis   . Overweight(278.02)     Past Surgical History:  Procedure Laterality Date  . ANGIOPLASTY  1991  . COLONOSCOPY  2008  . CORONARY ARTERY BYPASS GRAFT  04/2005    Current Medications: Outpatient Medications Prior to Visit  Medication Sig Dispense Refill  . amLODipine-benazepril (LOTREL) 10-40 MG capsule TAKE 1 CAPSULE BY MOUTH ONCE A DAY. 30 capsule  11  . atorvastatin (LIPITOR) 80 MG tablet TAKE (1) TABLET BY MOUTH ONCE DAILY. 30 tablet 6  . furosemide (LASIX) 20 MG tablet TAKE (1) TABLET BY MOUTH ONCE DAILY. 30 tablet 11  . metoprolol succinate (TOPROL-XL) 50 MG 24 hr tablet TAKE 1 TABLET ONCE DAILY IMMEDIATELY FOLLOWING A MEAL. 30 tablet 6  . warfarin (COUMADIN) 5 MG tablet TAKE 1 TABLET ON MONDAYS, WEDNESDAYS AND FRIDAYS, THEN TAKE 1/2 TABLET EVERY OTHER DAY. 30 tablet 4   No facility-administered medications prior to visit.      Allergies:   Ibuprofen   Social History   Social History  . Marital status: Married    Spouse name: N/A  . Number of children: N/A  . Years of education: N/A   Occupational History  . Retired principal from [employer]    subsequently served 12 years on [employer]   Social History Main Topics  . Smoking status: Never Smoker  . Smokeless tobacco: Never Used  . Alcohol use No  . Drug use: No  . Sexual activity: Not Asked   Other Topics Concern  . None   Social History Narrative   Married, lives in [place]     Family History:  The patient's family history includes Diabetes in his brother; Heart attack in his father; Hypertension in his father.   ROS:   Please see the history of present illness.    Review of Systems  Constitution: Positive for malaise/fatigue.  HENT: Positive  for hearing loss.   Cardiovascular: Positive for dyspnea on exertion.  Respiratory: Negative.   Endocrine: Negative.   Hematologic/Lymphatic: Negative.   Musculoskeletal: Positive for back pain.  Gastrointestinal: Negative.   Genitourinary: Negative.   Neurological: Negative.    All other systems reviewed and are negative.   PHYSICAL EXAM:   VS:  BP 136/72   Pulse (!) 51   Ht 5\' 11"  (1.803 m)   Wt 255 lb (115.7 kg)   SpO2 95%   BMI 35.57 kg/m   Physical Exam  GEN: Obese, in no acute distress Neck: no JVD, carotid bruits, or masses Cardiac: Irregular irregular; distant  heart sounds, no murmurs, rubs, or gallops  Respiratory:  clear to auscultation bilaterally, normal work of breathing GI: soft, nontender, nondistended, + BS Ext: Trace of edema bilaterally without cyanosis, clubbing, Good distal pulses bilaterally MS: no deformity or atrophy Skin: warm and dry, no rash Psych: euthymic mood, full affect  Wt Readings from Last 3 Encounters:  05/02/16 255 lb (115.7 kg)  01/21/16 256 lb (116.1 kg)  04/10/15 258 lb (117 kg)      Studies/Labs Reviewed:   EKG:  EKG is Not ordered today.    Recent Labs: No results found for requested labs within last 8760 hours.   Lipid Panel    Component Value Date/Time   CHOL 132 04/11/2015 0817   TRIG 89 04/11/2015 0817   HDL 49 04/11/2015 0817   CHOLHDL 2.7 04/11/2015 0817   CHOLHDL 2.6 03/20/2014 0837   VLDL 16 03/20/2014 0837   LDLCALC 65 04/11/2015 0817    Additional studies/ records that were reviewed today include:  2-D echo 01/29/16 Study Conclusions   - Left ventricle: The cavity size was normal. Wall thickness was   increased in a pattern of severe LVH. Systolic function was   mildly reduced. The estimated ejection fraction was in the range   of 45% to 50%. There is hypokinesis of the mid-apicalanteroseptal   and apical myocardium. - Aortic valve: Trileaflet; moderately calcified leaflets. There   was trivial regurgitation. - Aortic root: The aortic root was mildly dilated. - Mitral valve: Calcified annulus. There was trivial regurgitation. - Left atrium: The atrium was moderately dilated. - Right atrium: The atrium was mildly dilated. Central venous   pressure (est): 8 mm Hg. - Tricuspid valve: There was trivial regurgitation. - Pulmonary arteries: Systolic pressure could not be accurately   estimated. - Pericardium, extracardiac: There was no pericardial effusion.   Impressions:   - Severe LVH with LVEF approximately 45-50%. There is hypokinesis   of the mid to apical anteroseptal wall  and apex, no obvious LV   mural thrombus with Definity contrast. Indeterminate diastolic   function in the setting of atrial fibrillation. Moderate left   atrial enlargement. MAC with trivial mitral regurgitation. Mildly   dilated aortic root with sclerotic aortic valve and trivial   aortic stenosis. Mildly dilated right atrium. Trivial tricuspid   regurgitation.       ASSESSMENT:    1. Atrial fibrillation, unspecified type (HCC)   2. Cardiomyopathy, ischemic   3. Coronary artery disease involving native coronary artery of native heart without angina pectoris   4. Essential hypertension      PLAN:  In order of problems listed above:  Atrial fibrillation with controlled rate on metoprolol and Coumadin  Ischemic cardiomyopathy most recent echo LVEF 45-50% with severe LVH no significant change from prior echoes. Patient's dyspnea on exertion has improved some. He has  mild ankle edema. No change in medications. Follow-up with Dr. branch in 4 months.  CAD without angina  Essential hypertension controlled    Medication Adjustments/Labs and Tests Ordered: Current medicines are reviewed at length with the patient today.  Concerns regarding medicines are outlined above.  Medication changes, Labs and Tests ordered today are listed in the Patient Instructions below. Patient Instructions  Your physician recommends that you schedule a follow-up appointment in: 4 months Dr Wyline MoodBranch    Your physician recommends that you continue on your current medications as directed. Please refer to the Current Medication list given to you today.     Thank you for choosing [facility] !            Elson ClanSigned, Dyan Labarbera, PA-C  05/02/2016 11:25 AM    [facility] [address], [place], [place]  [address] Phone: [phone]; Fax: [phone]

## 2016-05-02 ENCOUNTER — Ambulatory Visit (INDEPENDENT_AMBULATORY_CARE_PROVIDER_SITE_OTHER): Payer: Medicare Other | Admitting: Physician Assistant

## 2016-05-02 ENCOUNTER — Encounter: Payer: Self-pay | Admitting: Physician Assistant

## 2016-05-02 VITALS — BP 136/72 | HR 51 | Ht 71.0 in | Wt 255.0 lb

## 2016-05-02 DIAGNOSIS — I2510 Atherosclerotic heart disease of native coronary artery without angina pectoris: Secondary | ICD-10-CM | POA: Diagnosis not present

## 2016-05-02 DIAGNOSIS — I4891 Unspecified atrial fibrillation: Secondary | ICD-10-CM | POA: Diagnosis not present

## 2016-05-02 DIAGNOSIS — I255 Ischemic cardiomyopathy: Secondary | ICD-10-CM

## 2016-05-02 DIAGNOSIS — I10 Essential (primary) hypertension: Secondary | ICD-10-CM | POA: Diagnosis not present

## 2016-05-02 NOTE — Patient Instructions (Signed)
Your physician recommends that you schedule a follow-up appointment in: 4 months Dr Branch    Your physician recommends that you continue on your current medications as directed. Please refer to the Current Medication list given to you today.     Thank you for choosing [facility] !         

## 2016-05-19 ENCOUNTER — Ambulatory Visit (INDEPENDENT_AMBULATORY_CARE_PROVIDER_SITE_OTHER): Payer: Medicare Other | Admitting: *Deleted

## 2016-05-19 ENCOUNTER — Encounter: Payer: Medicare Other | Admitting: Family Medicine

## 2016-05-19 DIAGNOSIS — Z5181 Encounter for therapeutic drug level monitoring: Secondary | ICD-10-CM

## 2016-05-19 DIAGNOSIS — Z23 Encounter for immunization: Secondary | ICD-10-CM

## 2016-05-19 DIAGNOSIS — I4891 Unspecified atrial fibrillation: Secondary | ICD-10-CM | POA: Diagnosis not present

## 2016-05-19 LAB — POCT INR: INR: 2.7

## 2016-06-02 ENCOUNTER — Ambulatory Visit (INDEPENDENT_AMBULATORY_CARE_PROVIDER_SITE_OTHER): Payer: Medicare Other | Admitting: Family Medicine

## 2016-06-02 ENCOUNTER — Encounter: Payer: Self-pay | Admitting: Family Medicine

## 2016-06-02 VITALS — BP 118/68 | Ht 69.0 in | Wt 252.0 lb

## 2016-06-02 DIAGNOSIS — Z0000 Encounter for general adult medical examination without abnormal findings: Secondary | ICD-10-CM | POA: Diagnosis not present

## 2016-06-02 DIAGNOSIS — Z79899 Other long term (current) drug therapy: Secondary | ICD-10-CM

## 2016-06-02 DIAGNOSIS — E784 Other hyperlipidemia: Secondary | ICD-10-CM | POA: Diagnosis not present

## 2016-06-02 DIAGNOSIS — R7303 Prediabetes: Secondary | ICD-10-CM

## 2016-06-02 DIAGNOSIS — E7849 Other hyperlipidemia: Secondary | ICD-10-CM

## 2016-06-02 DIAGNOSIS — I10 Essential (primary) hypertension: Secondary | ICD-10-CM

## 2016-06-02 NOTE — Progress Notes (Signed)
Subjective:    Patient ID: [name], male    DOB: 1930-07-25, 82 y.o.   MRN: [id number]  HPI AWV- Annual Wellness Visit  The patient was seen for their annual wellness visit. The patient's past medical history, surgical history, and family history were reviewed. Pertinent vaccines were reviewed ( tetanus, pneumonia, shingles, flu) The patient's medication list was reviewed and updated. Patient does do a good job taking his medicines. He does try to watch how he eats to some degree but eats out a lot because cannot fix foods. Is under a lot of stress finds himself feeling stress but denies any type of depression does not want any medication. The height and weight were entered. The patient's current BMI is: 37.21  Cognitive screening was completed. Outcome of Mini - Cog: pass   Falls within the past 6 months: fell in the tub a while back. Pt states he is prone to falls. Loses his balances sometimes. And has a bad knee.  Pass fall assessment.   Current tobacco usage: none (All patients who use tobacco were given written and verbal information on quitting)  Recent listing of emergency department/hospitalizations over the past year were reviewed.  current specialist the patient sees on a regular basis: Dr. Wyline MoodBranch - cardiologist   Medicare annual wellness visit patient questionnaire was reviewed.  A written screening schedule for the patient for the next 5-10 years was given. Appropriate discussion of followup regarding next visit was discussed.       Review of Systems  Constitutional: Negative for activity change, appetite change and fever.  HENT: Negative for congestion and rhinorrhea.   Eyes: Negative for discharge.  Respiratory: Negative for cough and wheezing.   Cardiovascular: Negative for chest pain.  Gastrointestinal: Negative for abdominal pain, blood in stool and vomiting.  Genitourinary: Negative for difficulty urinating and frequency.  Musculoskeletal: Negative  for neck pain.  Skin: Negative for rash.  Allergic/Immunologic: Negative for environmental allergies and food allergies.  Neurological: Negative for weakness and headaches.  Psychiatric/Behavioral: Negative for agitation.       Objective:   Physical Exam  Constitutional: He appears well-developed and well-nourished.  HENT:  Head: Normocephalic and atraumatic.  Right Ear: External ear normal.  Left Ear: External ear normal.  Nose: Nose normal.  Mouth/Throat: Oropharynx is clear and moist.  Eyes: EOM are normal. Pupils are equal, round, and reactive to light.  Neck: Normal range of motion. Neck supple. No thyromegaly present.  Cardiovascular: Normal rate, regular rhythm and normal heart sounds.   No murmur heard. Pulmonary/Chest: Effort normal and breath sounds normal. No respiratory distress. He has no wheezes.  Abdominal: Soft. Bowel sounds are normal. He exhibits no distension and no mass. There is no tenderness.  Genitourinary: Penis normal.  Musculoskeletal: Normal range of motion. He exhibits no edema.  Lymphadenopathy:    He has no cervical adenopathy.  Neurological: He is alert. He exhibits normal muscle tone.  Skin: Skin is warm and dry. No erythema.  Psychiatric: He has a normal mood and affect. His behavior is normal. Judgment normal.          Assessment & Plan:  Adult wellness-complete.wellness physical was conducted today. Importance of diet and exercise were discussed in detail. In addition to this a discussion regarding safety was also covered. We also reviewed over immunizations and gave recommendations regarding current immunization needed for age. In addition to this additional areas were also touched on including: Preventative health exams needed: Colonoscopy not indictaed  Patient was advised yearly wellness exam   patient does take his cholesterol medcine on a regular as e ee lab wor to check ffctieness Prevous labs reviewed with patien    patiet alsoHe  takes his blood pressure medicine on a regular basis. Denies problems with it. We will check a metabolic 7.

## 2016-06-04 LAB — BASIC METABOLIC PANEL
BUN/Creatinine Ratio: 19 (ref 10–24)
BUN: 20 mg/dL (ref 8–27)
CALCIUM: 8.9 mg/dL (ref 8.6–10.2)
CO2: 24 mmol/L (ref 18–29)
CREATININE: 1.06 mg/dL (ref 0.76–1.27)
Chloride: 106 mmol/L (ref 96–106)
GFR calc Af Amer: 75 mL/min/{1.73_m2} (ref >59)
GFR calc non Af Amer: 65 mL/min/{1.73_m2} (ref >59)
GLUCOSE: 111 mg/dL — AB (ref 65–99)
Potassium: 5.9 mmol/L — ABNORMAL HIGH (ref 3.5–5.2)
SODIUM: 146 mmol/L — AB (ref 134–144)

## 2016-06-04 LAB — LIPID PANEL
CHOL/HDL RATIO: 2.4 ratio (ref 0.0–5.0)
Cholesterol, Total: 128 mg/dL (ref 100–199)
HDL: 53 mg/dL (ref >39)
LDL CALC: 62 mg/dL (ref 0–99)
Triglycerides: 64 mg/dL (ref 0–149)
VLDL Cholesterol Cal: 13 mg/dL (ref 5–40)

## 2016-06-04 LAB — HEPATIC FUNCTION PANEL
ALBUMIN: 3.9 g/dL (ref 3.5–4.7)
ALT: 14 IU/L (ref 0–44)
AST: 25 IU/L (ref 0–40)
Alkaline Phosphatase: 90 IU/L (ref 39–117)
BILIRUBIN TOTAL: 0.7 mg/dL (ref 0.0–1.2)
BILIRUBIN, DIRECT: 0.12 mg/dL (ref 0.00–0.40)
TOTAL PROTEIN: 6.8 g/dL (ref 6.0–8.5)

## 2016-06-04 LAB — HEMOGLOBIN A1C
Est. average glucose Bld gHb Est-mCnc: 123 mg/dL
Hgb A1c MFr Bld: 5.9 % — ABNORMAL HIGH (ref 4.8–5.6)

## 2016-06-04 NOTE — Addendum Note (Signed)
Addended by: Theodora BlowREWS, Larisa Lanius R on: 06/04/2016 10:43 AM   Modules accepted: Orders

## 2016-06-10 LAB — BASIC METABOLIC PANEL
BUN/Creatinine Ratio: 17 (ref 10–24)
BUN: 17 mg/dL (ref 8–27)
CALCIUM: 9 mg/dL (ref 8.6–10.2)
CHLORIDE: 102 mmol/L (ref 96–106)
CO2: 24 mmol/L (ref 18–29)
Creatinine, Ser: 1.02 mg/dL (ref 0.76–1.27)
GFR calc Af Amer: 78 mL/min/{1.73_m2} (ref >59)
GFR calc non Af Amer: 68 mL/min/{1.73_m2} (ref >59)
Glucose: 116 mg/dL — ABNORMAL HIGH (ref 65–99)
POTASSIUM: 4.5 mmol/L (ref 3.5–5.2)
Sodium: 143 mmol/L (ref 134–144)

## 2016-06-18 ENCOUNTER — Ambulatory Visit (INDEPENDENT_AMBULATORY_CARE_PROVIDER_SITE_OTHER): Payer: Medicare Other | Admitting: Family Medicine

## 2016-06-18 ENCOUNTER — Encounter: Payer: Self-pay | Admitting: Family Medicine

## 2016-06-18 VITALS — BP 110/64 | Temp 97.6°F | Ht 69.0 in | Wt 252.0 lb

## 2016-06-18 DIAGNOSIS — J329 Chronic sinusitis, unspecified: Secondary | ICD-10-CM | POA: Diagnosis not present

## 2016-06-18 MED ORDER — AZITHROMYCIN 250 MG PO TABS
ORAL_TABLET | ORAL | 0 refills | Status: AC
Start: 2016-06-18 — End: 2016-06-23

## 2016-06-18 NOTE — Progress Notes (Signed)
   Subjective:    Patient ID: [name], male    DOB: 1930/07/01, 82 y.o.   MRN: [id number]  Sinusitis  This is a new problem. Episode onset: 5 days. Associated symptoms include congestion, coughing and a sore throat. Past treatments include acetaminophen.   Uses cough med for cough  No fever  Started Friday,  Patient notes headache frontal in nature. Comes and goes. Worse with change of position. Next  Patient is on Coumadin. He states antibiotics a number of causes significant problem with it.   Review of Systems  HENT: Positive for congestion and sore throat.   Respiratory: Positive for cough.        Objective:   Physical Exam Alert vital stable hydration good H&T mom his congestion frontal tenderness pharynx normal lungs clear. Heart regular rhythm       Assessment & Plan:  Impression rhinosinusitis likely post viral, discussed with patient. plan antibiotics prescribed. Questions answered. Symptomatic care discussed. warning signs discussed. WSL Will utilize Zithromax with its tendency to impacted minimally on INR warning signs discussed

## 2016-06-25 ENCOUNTER — Other Ambulatory Visit: Payer: Self-pay | Admitting: Cardiology

## 2016-07-07 ENCOUNTER — Ambulatory Visit (INDEPENDENT_AMBULATORY_CARE_PROVIDER_SITE_OTHER): Payer: Medicare Other | Admitting: *Deleted

## 2016-07-07 DIAGNOSIS — I4891 Unspecified atrial fibrillation: Secondary | ICD-10-CM | POA: Diagnosis not present

## 2016-07-07 DIAGNOSIS — Z5181 Encounter for therapeutic drug level monitoring: Secondary | ICD-10-CM | POA: Diagnosis not present

## 2016-07-07 LAB — POCT INR: INR: 3.7

## 2016-07-28 ENCOUNTER — Ambulatory Visit (INDEPENDENT_AMBULATORY_CARE_PROVIDER_SITE_OTHER): Payer: Medicare Other | Admitting: *Deleted

## 2016-07-28 DIAGNOSIS — I4891 Unspecified atrial fibrillation: Secondary | ICD-10-CM

## 2016-07-28 DIAGNOSIS — Z5181 Encounter for therapeutic drug level monitoring: Secondary | ICD-10-CM | POA: Diagnosis not present

## 2016-07-28 LAB — POCT INR: INR: 3

## 2016-07-29 ENCOUNTER — Encounter: Payer: Self-pay | Admitting: Internal Medicine

## 2016-07-29 ENCOUNTER — Other Ambulatory Visit: Payer: Self-pay | Admitting: Cardiology

## 2016-08-25 ENCOUNTER — Ambulatory Visit (INDEPENDENT_AMBULATORY_CARE_PROVIDER_SITE_OTHER): Payer: Medicare Other | Admitting: *Deleted

## 2016-08-25 DIAGNOSIS — I4891 Unspecified atrial fibrillation: Secondary | ICD-10-CM

## 2016-08-25 DIAGNOSIS — Z5181 Encounter for therapeutic drug level monitoring: Secondary | ICD-10-CM | POA: Diagnosis not present

## 2016-08-25 LAB — POCT INR: INR: 2.9

## 2016-09-05 ENCOUNTER — Encounter (INDEPENDENT_AMBULATORY_CARE_PROVIDER_SITE_OTHER): Payer: Self-pay | Admitting: *Deleted

## 2016-09-10 ENCOUNTER — Ambulatory Visit: Payer: Medicare Other | Admitting: Cardiology

## 2016-09-22 ENCOUNTER — Ambulatory Visit (INDEPENDENT_AMBULATORY_CARE_PROVIDER_SITE_OTHER): Payer: Medicare Other | Admitting: *Deleted

## 2016-09-22 DIAGNOSIS — Z5181 Encounter for therapeutic drug level monitoring: Secondary | ICD-10-CM | POA: Diagnosis not present

## 2016-09-22 DIAGNOSIS — I4891 Unspecified atrial fibrillation: Secondary | ICD-10-CM

## 2016-09-22 LAB — POCT INR: INR: 1.9

## 2016-10-07 ENCOUNTER — Ambulatory Visit (INDEPENDENT_AMBULATORY_CARE_PROVIDER_SITE_OTHER): Payer: Medicare Other | Admitting: Cardiology

## 2016-10-07 ENCOUNTER — Encounter: Payer: Self-pay | Admitting: Cardiology

## 2016-10-07 VITALS — BP 140/80 | HR 84 | Ht 69.0 in | Wt 254.0 lb

## 2016-10-07 DIAGNOSIS — I4891 Unspecified atrial fibrillation: Secondary | ICD-10-CM | POA: Diagnosis not present

## 2016-10-07 DIAGNOSIS — E784 Other hyperlipidemia: Secondary | ICD-10-CM | POA: Diagnosis not present

## 2016-10-07 DIAGNOSIS — I2510 Atherosclerotic heart disease of native coronary artery without angina pectoris: Secondary | ICD-10-CM

## 2016-10-07 DIAGNOSIS — E7849 Other hyperlipidemia: Secondary | ICD-10-CM

## 2016-10-07 DIAGNOSIS — I10 Essential (primary) hypertension: Secondary | ICD-10-CM

## 2016-10-07 NOTE — Patient Instructions (Signed)
Medication Instructions:  Your physician recommends that you continue on your current medications as directed. Please refer to the Current Medication list given to you today.   Labwork: none  Testing/Procedures: none  Follow-Up: Your physician wants you to follow-up in: 6 months.  You will receive a reminder letter in the mail two months in advance. If you don't receive a letter, please call our office to schedule the follow-up appointment.   Any Other Special Instructions Will Be Listed Below (If Applicable).     If you need a refill on your cardiac medications before your next appointment, please call your pharmacy.  av

## 2016-10-07 NOTE — Progress Notes (Signed)
Clinical Summary [name] is a 83 y.o.male seen today for follow up of the following medical problems.   1. CAD/ICM  - prior CABG in Nov 2006 at [facility] - echo 01/2016 LVEF 45-50%, severe LVH, apical hypokinesis, indeterminate diastolic function due to afib.   - no recent chest pain or SOB - compliant with meds   2. Afib  - not interested in NOACs - no recent palpitations - no bleeding troubles on coumadin  3. HTN  - compliant with meds   4. HL  - compliant with statin - 05/2016 TC 128 TG 64 HDL 53 LDL 62    SH: spends much of his time his time taking care of his wife who is blind, poor mobility at home.   Past Medical History:  Diagnosis Date  . Anticoagulant disorder ([place])   . ASCVD (arteriosclerotic cardiovascular disease)    CABG 11/06; MUGA 11/08 - 44%; 3/07 mildly decreased EF on echo  . Atrial fibrillation ([place])    Initially occurred following CABG; event recorder in 11/08 - no AF; recurred 08/2009  . CHF (congestive heart failure) ([place])   . Coronary artery disease   . Hyperlipidemia   . Hypertension   . Nephrolithiasis   . Overweight(278.02)      Allergies  Allergen Reactions  . Ibuprofen Rash    REACTION: skin rash     Current Outpatient Prescriptions  Medication Sig Dispense Refill  . amLODipine-benazepril (LOTREL) 10-40 MG capsule TAKE 1 CAPSULE BY MOUTH ONCE A DAY. 30 capsule 11  . atorvastatin (LIPITOR) 80 MG tablet TAKE (1) TABLET BY MOUTH ONCE DAILY. 30 tablet 6  . furosemide (LASIX) 20 MG tablet TAKE (1) TABLET BY MOUTH ONCE DAILY. 30 tablet 11  . metoprolol succinate (TOPROL-XL) 50 MG 24 hr tablet TAKE 1 TABLET ONCE DAILY IMMEDIATELY FOLLOWING A MEAL. 30 tablet 6  . warfarin (COUMADIN) 5 MG tablet TAKE 1 TABLET ON MONDAYS, WEDNESDAYS AND FRIDAYS, THEN TAKE 1/2 TABLET EVERY OTHER DAY. 30 tablet 4   No current facility-administered medications for this visit.      Past Surgical History:  Procedure Laterality Date    . ANGIOPLASTY  1991  . COLONOSCOPY  2008  . CORONARY ARTERY BYPASS GRAFT  04/2005     Allergies  Allergen Reactions  . Ibuprofen Rash    REACTION: skin rash      Family History  Problem Relation Age of Onset  . Hypertension Father   . Heart attack Father   . Diabetes Brother      Social History [name] reports that he has never smoked. He has never used smokeless tobacco. [name] reports that he does not drink alcohol.   Review of Systems CONSTITUTIONAL: No weight loss, fever, chills, weakness or fatigue.  HEENT: Eyes: No visual loss, blurred vision, double vision or yellow sclerae.No hearing loss, sneezing, congestion, runny nose or sore throat.  SKIN: No rash or itching.  CARDIOVASCULAR: per HPI RESPIRATORY: No shortness of breath, cough or sputum.  GASTROINTESTINAL: No anorexia, nausea, vomiting or diarrhea. No abdominal pain or blood.  GENITOURINARY: No burning on urination, no polyuria NEUROLOGICAL: No headache, dizziness, syncope, paralysis, ataxia, numbness or tingling in the extremities. No change in bowel or bladder control.  MUSCULOSKELETAL: No muscle, back pain, joint pain or stiffness.  LYMPHATICS: No enlarged nodes. No history of splenectomy.  PSYCHIATRIC: No history of depression or anxiety.  ENDOCRINOLOGIC: No reports of sweating, cold or heat intolerance. No polyuria or  polydipsia.  [name]   Physical Examination Vitals:   10/07/16 1506  BP: 140/80  Pulse: 84   Vitals:   10/07/16 1506  Weight: 254 lb (115.2 kg)  Height: _0  (1.753 m)    Gen: resting comfortably, no acute distress HEENT: no scleral icterus, pupils equal round and reactive, no palptable cervical adenopathy,  CV: RRR, no m/r/g, no jvd Resp: Clear to auscultation bilaterally GI: abdomen is soft, non-tender, non-distended, normal bowel sounds, no hepatosplenomegaly MSK: extremities are warm, no edema.  Skin: warm, no rash Neuro:  no focal deficits Psych: appropriate  affect   Diagnostic Studies July 2011 Echo: mild to moderately decreased LVEF (no LVEF given), apex akinetic, severe LVH, severe LAE   05/05/13 Clinic EKG: afib at 58 bpm, LAD, anteroseptal and anterolateral Q waves, inferior Q waves   05/2013 Echo Technically difficult study, severe LVH, elevated LA pressure. Difficult to evalute LV function even with contrast, appears low normal to normal.   01/2016 echo Study Conclusions  - Left ventricle: The cavity size was normal. Wall thickness was   increased in a pattern of severe LVH. Systolic function was   mildly reduced. The estimated ejection fraction was in the range   of 45% to 50%. There is hypokinesis of the mid-apicalanteroseptal   and apical myocardium. - Aortic valve: Trileaflet; moderately calcified leaflets. There   was trivial regurgitation. - Aortic root: The aortic root was mildly dilated. - Mitral valve: Calcified annulus. There was trivial regurgitation. - Left atrium: The atrium was moderately dilated. - Right atrium: The atrium was mildly dilated. Central venous   pressure (est): 8 mm Hg. - Tricuspid valve: There was trivial regurgitation. - Pulmonary arteries: Systolic pressure could not be accurately   estimated. - Pericardium, extracardiac: There was no pericardial effusion.  Impressions:  - Severe LVH with LVEF approximately 45-50%. There is hypokinesis   of the mid to apical anteroseptal wall and apex, no obvious LV   mural thrombus with Definity contrast. Indeterminate diastolic   function in the setting of atrial fibrillation. Moderate left   atrial enlargement. MAC with trivial mitral regurgitation. Mildly   dilated aortic root with sclerotic aortic valve and trivial   aortic stenosis. Mildly dilated right atrium. Trivial tricuspid   regurgitation.  Assessment and Plan  1. CAD -no recent symptoms.  - continue current meds  2. Afib  - denies any symptoms  - CHADS2Vasc score is 4,  continue anticoag  3. HTN  - bp is at goal today, continue current meds  4. Hyperlipidemia  -Continue staitn, lipids are at goal.    5. Morbid obesity - counseled on dietary changes and exercise.   F/u 6 months       Arnoldo Lenis, M.D.

## 2016-10-20 ENCOUNTER — Ambulatory Visit (INDEPENDENT_AMBULATORY_CARE_PROVIDER_SITE_OTHER): Payer: Medicare Other | Admitting: *Deleted

## 2016-10-20 DIAGNOSIS — I4891 Unspecified atrial fibrillation: Secondary | ICD-10-CM | POA: Diagnosis not present

## 2016-10-20 DIAGNOSIS — Z5181 Encounter for therapeutic drug level monitoring: Secondary | ICD-10-CM

## 2016-10-20 LAB — POCT INR: INR: 3.2

## 2016-11-10 ENCOUNTER — Other Ambulatory Visit: Payer: Self-pay | Admitting: Cardiology

## 2016-11-17 ENCOUNTER — Ambulatory Visit (INDEPENDENT_AMBULATORY_CARE_PROVIDER_SITE_OTHER): Payer: Medicare Other | Admitting: *Deleted

## 2016-11-17 DIAGNOSIS — I4891 Unspecified atrial fibrillation: Secondary | ICD-10-CM | POA: Diagnosis not present

## 2016-11-17 DIAGNOSIS — Z5181 Encounter for therapeutic drug level monitoring: Secondary | ICD-10-CM | POA: Diagnosis not present

## 2016-11-17 LAB — POCT INR: INR: 3

## 2016-11-27 ENCOUNTER — Encounter: Payer: Self-pay | Admitting: *Deleted

## 2016-12-22 ENCOUNTER — Ambulatory Visit (INDEPENDENT_AMBULATORY_CARE_PROVIDER_SITE_OTHER): Payer: Medicare Other | Admitting: *Deleted

## 2016-12-22 DIAGNOSIS — Z5181 Encounter for therapeutic drug level monitoring: Secondary | ICD-10-CM

## 2016-12-22 DIAGNOSIS — I4891 Unspecified atrial fibrillation: Secondary | ICD-10-CM | POA: Diagnosis not present

## 2016-12-22 LAB — POCT INR: INR: 2.9

## 2016-12-26 ENCOUNTER — Other Ambulatory Visit: Payer: Self-pay

## 2016-12-26 MED ORDER — FUROSEMIDE 20 MG PO TABS: ORAL_TABLET | ORAL | 11 refills | Status: DC

## 2016-12-26 NOTE — Telephone Encounter (Signed)
Refilled lasix per fax request 

## 2017-01-06 ENCOUNTER — Other Ambulatory Visit: Payer: Self-pay | Admitting: Cardiology

## 2017-01-26 ENCOUNTER — Encounter: Payer: Self-pay | Admitting: *Deleted

## 2017-02-04 ENCOUNTER — Ambulatory Visit (INDEPENDENT_AMBULATORY_CARE_PROVIDER_SITE_OTHER): Payer: Medicare Other | Admitting: *Deleted

## 2017-02-04 DIAGNOSIS — Z5181 Encounter for therapeutic drug level monitoring: Secondary | ICD-10-CM

## 2017-02-04 DIAGNOSIS — I4891 Unspecified atrial fibrillation: Secondary | ICD-10-CM

## 2017-02-04 LAB — POCT INR: INR: 2.9

## 2017-02-10 ENCOUNTER — Other Ambulatory Visit: Payer: Self-pay | Admitting: Cardiology

## 2017-02-25 ENCOUNTER — Other Ambulatory Visit: Payer: Self-pay | Admitting: Cardiology

## 2017-02-26 ENCOUNTER — Other Ambulatory Visit: Payer: Self-pay

## 2017-02-26 MED ORDER — METOPROLOL SUCCINATE ER 50 MG PO TB24: ORAL_TABLET | ORAL | 3 refills | Status: DC

## 2017-03-10 ENCOUNTER — Other Ambulatory Visit: Payer: Self-pay | Admitting: Cardiology

## 2017-03-18 ENCOUNTER — Ambulatory Visit (INDEPENDENT_AMBULATORY_CARE_PROVIDER_SITE_OTHER): Payer: Medicare Other | Admitting: *Deleted

## 2017-03-18 DIAGNOSIS — I4891 Unspecified atrial fibrillation: Secondary | ICD-10-CM

## 2017-03-18 DIAGNOSIS — Z5181 Encounter for therapeutic drug level monitoring: Secondary | ICD-10-CM

## 2017-03-18 LAB — POCT INR: INR: 4.2

## 2017-03-23 ENCOUNTER — Other Ambulatory Visit: Payer: Self-pay | Admitting: Cardiology

## 2017-04-01 ENCOUNTER — Ambulatory Visit (INDEPENDENT_AMBULATORY_CARE_PROVIDER_SITE_OTHER): Payer: Medicare Other | Admitting: *Deleted

## 2017-04-01 DIAGNOSIS — Z5181 Encounter for therapeutic drug level monitoring: Secondary | ICD-10-CM

## 2017-04-01 DIAGNOSIS — I4891 Unspecified atrial fibrillation: Secondary | ICD-10-CM | POA: Diagnosis not present

## 2017-04-01 LAB — POCT INR: INR: 2.4

## 2017-04-06 ENCOUNTER — Other Ambulatory Visit: Payer: Self-pay | Admitting: Cardiology

## 2017-04-15 ENCOUNTER — Ambulatory Visit (INDEPENDENT_AMBULATORY_CARE_PROVIDER_SITE_OTHER): Payer: Medicare Other | Admitting: Cardiology

## 2017-04-15 ENCOUNTER — Encounter: Payer: Self-pay | Admitting: Cardiology

## 2017-04-15 VITALS — BP 104/70 | HR 55 | Ht 69.0 in | Wt 244.8 lb

## 2017-04-15 DIAGNOSIS — I4891 Unspecified atrial fibrillation: Secondary | ICD-10-CM | POA: Diagnosis not present

## 2017-04-15 DIAGNOSIS — I10 Essential (primary) hypertension: Secondary | ICD-10-CM

## 2017-04-15 DIAGNOSIS — I2510 Atherosclerotic heart disease of native coronary artery without angina pectoris: Secondary | ICD-10-CM

## 2017-04-15 DIAGNOSIS — E782 Mixed hyperlipidemia: Secondary | ICD-10-CM

## 2017-04-15 DIAGNOSIS — Z23 Encounter for immunization: Secondary | ICD-10-CM | POA: Diagnosis not present

## 2017-04-15 MED ORDER — FUROSEMIDE 20 MG PO TABS: ORAL_TABLET | ORAL | 11 refills | Status: DC

## 2017-04-15 NOTE — Progress Notes (Signed)
Clinical Summary [name] is a 83 y.o.male seen today for follow up of the following medical problems.   1. CAD/ICM  - prior CABG in Nov 2006 at [facility] - echo 01/2016 LVEF 45-50%, severe LVH, apical hypokinesis, indeterminate diastolic function due to afib.     - denies any chest pain, no SOB or DOE - compliant with meds.   2. Afib  -- no palpitations - no bleeding on coumadin. Has not been interested in NOACs   3. HTN  -he is compliant with bp meds  4. HL   - he is compliant with statin - 05/2016 TC 128 TG 64 HDL 53 LDL 62    SH: spends much of his time his time taking care of his wife who is blind, poor mobility at home.  Labs coming up with pcp in December Past Medical History:  Diagnosis Date  . Anticoagulant disorder ([place])   . ASCVD (arteriosclerotic cardiovascular disease)    CABG 11/06; MUGA 11/08 - 44%; 3/07 mildly decreased EF on echo  . Atrial fibrillation ([place])    Initially occurred following CABG; event recorder in 11/08 - no AF; recurred 08/2009  . CHF (congestive heart failure) ([place])   . Coronary artery disease   . Hyperlipidemia   . Hypertension   . Nephrolithiasis   . Overweight(278.02)      Allergies  Allergen Reactions  . Ibuprofen Rash    REACTION: skin rash     Current Outpatient Prescriptions  Medication Sig Dispense Refill  . amLODipine-benazepril (LOTREL) 10-40 MG capsule TAKE 1 CAPSULE BY MOUTH ONCE DAILY. 30 capsule 0  . atorvastatin (LIPITOR) 80 MG tablet TAKE (1) TABLET BY MOUTH ONCE DAILY. 90 tablet 3  . furosemide (LASIX) 20 MG tablet TAKE (1) TABLET BY MOUTH ONCE DAILY. 30 tablet 11  . metoprolol succinate (TOPROL-XL) 50 MG 24 hr tablet TAKE 1 TABLET ONCE DAILY IMMEDIATELY FOLLOWING A MEAL. 90 tablet 3  . warfarin (COUMADIN) 5 MG tablet TAKE 1 TABLET ON MONDAYS, WEDNESDAYS AND FRIDAYS, THEN TAKE 1/2 TABLET EVERY OTHER DAY. 30 tablet 6   No current facility-administered medications for this visit.       Past Surgical History:  Procedure Laterality Date  . ANGIOPLASTY  1991  . COLONOSCOPY  2008  . CORONARY ARTERY BYPASS GRAFT  04/2005     Allergies  Allergen Reactions  . Ibuprofen Rash    REACTION: skin rash      Family History  Problem Relation Age of Onset  . Hypertension Father   . Heart attack Father   . Diabetes Brother      Social History [name] reports that he has never smoked. He has never used smokeless tobacco. [name] reports that he does not drink alcohol.   Review of Systems CONSTITUTIONAL: No weight loss, fever, chills, weakness or fatigue.  HEENT: Eyes: No visual loss, blurred vision, double vision or yellow sclerae.No hearing loss, sneezing, congestion, runny nose or sore throat.  SKIN: No rash or itching.  CARDIOVASCULAR: per hpi RESPIRATORY: No shortness of breath, cough or sputum.  GASTROINTESTINAL: No anorexia, nausea, vomiting or diarrhea. No abdominal pain or blood.  GENITOURINARY: No burning on urination, no polyuria NEUROLOGICAL: No headache, dizziness, syncope, paralysis, ataxia, numbness or tingling in the extremities. No change in bowel or bladder control.  MUSCULOSKELETAL: No muscle, back pain, joint pain or stiffness.  LYMPHATICS: No enlarged nodes. No history of splenectomy.  PSYCHIATRIC: No history of depression or anxiety.  ENDOCRINOLOGIC: No reports of sweating, cold or heat intolerance. No polyuria or polydipsia.  [name]   Physical Examination Vitals:   04/15/17 1026  BP: 104/70  Pulse: (!) 55  SpO2: 97%   Vitals:   04/15/17 1026  Weight: 244 lb 12.8 oz (111 kg)  Height: _0  (1.753 m)    Gen: resting comfortably, no acute distress HEENT: no scleral icterus, pupils equal round and reactive, no palptable cervical adenopathy,  CV: RRR, no m/r/g, no jvd Resp: Clear to auscultation bilaterally GI: abdomen is soft, non-tender, non-distended, normal bowel sounds, no hepatosplenomegaly MSK: extremities are warm, no  edema.  Skin: warm, no rash Neuro:  no focal deficits Psych: appropriate affect   Diagnostic Studies July 2011 Echo: mild to moderately decreased LVEF (no LVEF given), apex akinetic, severe LVH, severe LAE   05/05/13 Clinic EKG: afib at 58 bpm, LAD, anteroseptal and anterolateral Q waves, inferior Q waves   05/2013 Echo Technically difficult study, severe LVH, elevated LA pressure. Difficult to evalute LV function even with contrast, appears low normal to normal.   01/2016 echo Study Conclusions  - Left ventricle: The cavity size was normal. Wall thickness was increased in a pattern of severe LVH. Systolic function was mildly reduced. The estimated ejection fraction was in the range of 45% to 50%. There is hypokinesis of the mid-apicalanteroseptal and apical myocardium. - Aortic valve: Trileaflet; moderately calcified leaflets. There was trivial regurgitation. - Aortic root: The aortic root was mildly dilated. - Mitral valve: Calcified annulus. There was trivial regurgitation. - Left atrium: The atrium was moderately dilated. - Right atrium: The atrium was mildly dilated. Central venous pressure (est): 8 mm Hg. - Tricuspid valve: There was trivial regurgitation. - Pulmonary arteries: Systolic pressure could not be accurately estimated. - Pericardium, extracardiac: There was no pericardial effusion.  Impressions:  - Severe LVH with LVEF approximately 45-50%. There is hypokinesis of the mid to apical anteroseptal wall and apex, no obvious LV mural thrombus with Definity contrast. Indeterminate diastolic function in the setting of atrial fibrillation. Moderate left atrial enlargement. MAC with trivial mitral regurgitation. Mildly dilated aortic root with sclerotic aortic valve and trivial aortic stenosis. Mildly dilated right atrium. Trivial tricuspid regurgitation.    Assessment and Plan   1. CAD -denies any symptoms, he will  continue current meds - EKG in clinic shows SR, no ischemic changes  2. Afib  - CHADS2Vasc score is 4, continue anticoag - no symptoms, continue current meds  3. HTN  - bp is at goal, continue current meds  4. Hyperlipidemia  -lipids at goal, f/u upcoming pcp labs. Continue statin    F/u 6 months    Arnoldo Lenis, M.D.

## 2017-04-15 NOTE — Patient Instructions (Signed)

## 2017-04-29 ENCOUNTER — Ambulatory Visit (INDEPENDENT_AMBULATORY_CARE_PROVIDER_SITE_OTHER): Payer: Medicare Other | Admitting: *Deleted

## 2017-04-29 DIAGNOSIS — Z5181 Encounter for therapeutic drug level monitoring: Secondary | ICD-10-CM

## 2017-04-29 DIAGNOSIS — I4891 Unspecified atrial fibrillation: Secondary | ICD-10-CM

## 2017-04-29 LAB — POCT INR: INR: 3

## 2017-05-11 ENCOUNTER — Other Ambulatory Visit: Payer: Self-pay | Admitting: Cardiology

## 2017-05-27 ENCOUNTER — Ambulatory Visit (INDEPENDENT_AMBULATORY_CARE_PROVIDER_SITE_OTHER): Payer: Medicare Other | Admitting: *Deleted

## 2017-05-27 DIAGNOSIS — I4891 Unspecified atrial fibrillation: Secondary | ICD-10-CM

## 2017-05-27 DIAGNOSIS — Z5181 Encounter for therapeutic drug level monitoring: Secondary | ICD-10-CM | POA: Diagnosis not present

## 2017-05-27 LAB — POCT INR: INR: 3.6

## 2017-06-24 ENCOUNTER — Encounter (HOSPITAL_COMMUNITY): Payer: Self-pay | Admitting: *Deleted

## 2017-06-24 ENCOUNTER — Other Ambulatory Visit: Payer: Self-pay

## 2017-06-24 ENCOUNTER — Emergency Department (HOSPITAL_COMMUNITY): Payer: Medicare Other

## 2017-06-24 ENCOUNTER — Emergency Department (HOSPITAL_COMMUNITY)
Admission: EM | Admit: 2017-06-24 | Discharge: 2017-06-24 | Disposition: A | Discharge status: Home / Self Care | Payer: Medicare Other | Attending: Emergency Medicine | Admitting: Emergency Medicine

## 2017-06-24 DIAGNOSIS — R079 Chest pain, unspecified: Secondary | ICD-10-CM | POA: Diagnosis present

## 2017-06-24 DIAGNOSIS — I509 Heart failure, unspecified: Secondary | ICD-10-CM | POA: Diagnosis not present

## 2017-06-24 DIAGNOSIS — I110 Hypertensive heart disease with heart failure: Secondary | ICD-10-CM | POA: Diagnosis not present

## 2017-06-24 DIAGNOSIS — Z7901 Long term (current) use of anticoagulants: Secondary | ICD-10-CM | POA: Insufficient documentation

## 2017-06-24 DIAGNOSIS — I4891 Unspecified atrial fibrillation: Secondary | ICD-10-CM | POA: Diagnosis not present

## 2017-06-24 DIAGNOSIS — Z79899 Other long term (current) drug therapy: Secondary | ICD-10-CM | POA: Insufficient documentation

## 2017-06-24 DIAGNOSIS — Z951 Presence of aortocoronary bypass graft: Secondary | ICD-10-CM | POA: Diagnosis not present

## 2017-06-24 DIAGNOSIS — I2510 Atherosclerotic heart disease of native coronary artery without angina pectoris: Secondary | ICD-10-CM | POA: Insufficient documentation

## 2017-06-24 LAB — CBC
HCT: 41.5 % (ref 39.0–52.0)
HEMOGLOBIN: 13.7 g/dL (ref 13.0–17.0)
MCH: 32.8 pg (ref 26.0–34.0)
MCHC: 33 g/dL (ref 30.0–36.0)
MCV: 99.3 fL (ref 78.0–100.0)
PLATELETS: 215 10*3/uL (ref 150–400)
RBC: 4.18 MIL/uL — ABNORMAL LOW (ref 4.22–5.81)
RDW: 14 % (ref 11.5–15.5)
WBC: 6.6 10*3/uL (ref 4.0–10.5)

## 2017-06-24 LAB — COMPREHENSIVE METABOLIC PANEL
ALBUMIN: 3.6 g/dL (ref 3.5–5.0)
ALT: 14 U/L — ABNORMAL LOW (ref 17–63)
ANION GAP: 8 (ref 5–15)
AST: 19 U/L (ref 15–41)
Alkaline Phosphatase: 107 U/L (ref 38–126)
BUN: 21 mg/dL — ABNORMAL HIGH (ref 6–20)
CHLORIDE: 107 mmol/L (ref 101–111)
CO2: 25 mmol/L (ref 22–32)
Calcium: 8.9 mg/dL (ref 8.9–10.3)
Creatinine, Ser: 1.03 mg/dL (ref 0.61–1.24)
GFR calc non Af Amer: >60 mL/min (ref >60)
GLUCOSE: 115 mg/dL — AB (ref 65–99)
POTASSIUM: 4.8 mmol/L (ref 3.5–5.1)
SODIUM: 140 mmol/L (ref 135–145)
Total Bilirubin: 0.7 mg/dL (ref 0.3–1.2)
Total Protein: 7.3 g/dL (ref 6.5–8.1)

## 2017-06-24 LAB — PROTIME-INR
INR: 2.04
Prothrombin Time: 22.9 seconds — ABNORMAL HIGH (ref 11.4–15.2)

## 2017-06-24 LAB — TROPONIN I

## 2017-06-24 NOTE — Discharge Instructions (Signed)
Please return to the emergency department for any severe or worsening swelling pain fevers or difficulty breathing.

## 2017-06-24 NOTE — ED Provider Notes (Signed)
[facility] EMERGENCY DEPARTMENT Provider Note   CSN: [id number] Arrival date & time: 06/24/17  1316     History   Chief Complaint No chief complaint on file.   HPI [name] is a 83 y.o. male.  HPI  The patient is an 83 year old male, he has a known history of coronary disease as well as atrial fibrillation, he has undergone coronary bypass grafting as well as stenting, he has known congestive heart failure hypertension and hyperlipidemia.  He is on Coumadin therapy.  He reports that approximately 1 hour and 20 minutes ago he had a large bowel movement, he states that it was very large, very painful and immediately after having the bowel movement he developed chest pain which was uncomfortable and lasted longer than his usual chest pain lasts.  He reports that it was in the middle of his chest, heavy but also sharp, did not radiate to his back shoulders or jaw and was not associated with shortness of breath nausea vomiting or diaphoresis.  He denies any significant swelling of his legs outside of his normal mild swelling.  He reports that the chest pain has gradually improved and is now totally resolved and he has no symptoms whatsoever.  He states that he is feeling very well right now.  He has not yet taken his daily Coumadin and does not take aspirin  Past Medical History:  Diagnosis Date  . Anticoagulant disorder ([facility])   . ASCVD (arteriosclerotic cardiovascular disease)    CABG 11/06; MUGA 11/08 - 44%; 3/07 mildly decreased EF on echo  . Atrial fibrillation ([facility])    Initially occurred following CABG; event recorder in 11/08 - no AF; recurred 08/2009  . CHF (congestive heart failure) ([facility])   . Coronary artery disease   . Hyperlipidemia   . Hypertension   . Nephrolithiasis   . Overweight(278.02)     Patient Active Problem List   Diagnosis Date Noted  . Cardiomyopathy, ischemic 05/01/2016  . Viral gastroenteritis 01/06/2015  . Prediabetes 08/25/2014  . Encounter for  therapeutic drug monitoring 08/01/2013  . Atrial fibrillation ([facility]) 10/01/2010  . Chronic anticoagulation 10/01/2010  . Hyperlipidemia 12/18/2009  . Overweight(278.02) 12/18/2009  . NEPHROLITHIASIS 12/18/2009  . Essential hypertension 08/07/2009    Past Surgical History:  Procedure Laterality Date  . ANGIOPLASTY  1991  . COLONOSCOPY  2008  . CORONARY ARTERY BYPASS GRAFT  04/2005       Home Medications    Prior to Admission medications   Medication Sig Start Date End Date Taking? Authorizing Provider  amLODipine-benazepril (LOTREL) 10-40 MG capsule TAKE 1 CAPSULE BY MOUTH ONCE DAILY. 05/11/17   Antoine Poche, MD  atorvastatin (LIPITOR) 80 MG tablet TAKE (1) TABLET BY MOUTH ONCE DAILY. 03/23/17   Antoine Poche, MD  furosemide (LASIX) 20 MG tablet TAKE (1) TABLET BY MOUTH ONCE DAILY. 04/15/17   Antoine Poche, MD  metoprolol succinate (TOPROL-XL) 50 MG 24 hr tablet TAKE 1 TABLET ONCE DAILY IMMEDIATELY FOLLOWING A MEAL. 02/26/17   Antoine Poche, MD  warfarin (COUMADIN) 5 MG tablet TAKE 1 TABLET ON MONDAYS, WEDNESDAYS AND FRIDAYS, THEN TAKE 1/2 TABLET EVERY OTHER DAY. 11/10/16   Antoine Poche, MD    Family History Family History  Problem Relation Age of Onset  . Hypertension Father   . Heart attack Father   . Diabetes Brother     Social History Social History   Tobacco Use  . Smoking status: Never Smoker  . Smokeless  tobacco: Never Used  Substance Use Topics  . Alcohol use: No  . Drug use: No     Allergies   Ibuprofen   Review of Systems Review of Systems  All other systems reviewed and are negative.    Physical Exam Updated Vital Signs BP (!) 117/47   Pulse (!) 48   Temp 97.7 F (36.5 C) (Oral)   Resp 18   Ht 5\' 10"  (1.778 m)   Wt 115.7 kg (255 lb)   SpO2 98%   BMI 36.59 kg/m   Physical Exam  Constitutional: He appears well-developed and well-nourished. No distress.  HENT:  Head: Normocephalic and atraumatic.  Mouth/Throat:  Oropharynx is clear and moist. No oropharyngeal exudate.  Eyes: Conjunctivae and EOM are normal. Pupils are equal, round, and reactive to light. Right eye exhibits no discharge. Left eye exhibits no discharge. No scleral icterus.  Neck: Normal range of motion. Neck supple. No JVD present. No thyromegaly present.  Cardiovascular: Normal heart sounds and intact distal pulses. Exam reveals no gallop and no friction rub.  No murmur heard. Mild bradycardia, irregularly irregular rhythm, normal pulses  Pulmonary/Chest: Effort normal and breath sounds normal. No respiratory distress. He has no wheezes. He has no rales. He exhibits no tenderness.  Abdominal: Soft. Bowel sounds are normal. He exhibits no distension and no mass. There is no tenderness.  No abdominal tenderness to palpation  Musculoskeletal: Normal range of motion. He exhibits edema ( Scant bilateral lower extremity pitting edema). He exhibits no tenderness.  Lymphadenopathy:    He has no cervical adenopathy.  Neurological: He is alert. Coordination normal.  Skin: Skin is warm and dry. No rash noted. No erythema.  Psychiatric: He has a normal mood and affect. His behavior is normal.  Nursing note and vitals reviewed.    ED Treatments / Results  Labs (all labs ordered are listed, but only abnormal results are displayed) Labs Reviewed  CBC - Abnormal; Notable for the following components:      Result Value   RBC 4.18 (*)    All other components within normal limits  COMPREHENSIVE METABOLIC PANEL - Abnormal; Notable for the following components:   Glucose, Bld 115 (*)    BUN 21 (*)    ALT 14 (*)    All other components within normal limits  PROTIME-INR - Abnormal; Notable for the following components:   Prothrombin Time 22.9 (*)    All other components within normal limits  TROPONIN I  TROPONIN I    EKG  EKG Interpretation  Date/Time:  Wednesday June 24 2017 13:54:06 EST Ventricular Rate:  50 PR Interval:    QRS  Duration: 116 QT Interval:  460 QTC Calculation: 419 R Axis:   -48 Text Interpretation:  Undetermined rhythm Low voltage QRS Incomplete right bundle branch block Left anterior fascicular block Inferior infarct , age undetermined Cannot rule out Anteroseptal infarct , age undetermined Abnormal ECG Since last tracing afib now seen c/w 2008 Confirmed by Eber HongMiller, Iann Rodier ([id number]) on 06/24/2017 2:05:47 PM       Radiology Dg Chest 2 View  Result Date: 06/24/2017 CLINICAL DATA:  Chest pain EXAM: CHEST  2 VIEW COMPARISON:  08/21/2014 FINDINGS: Aortic atherosclerosis. Post sternotomy changes. No focal pulmonary opacity or effusion. Mild cardiomegaly. No pneumothorax. Degenerative changes of the spine. IMPRESSION: 1. Mild cardiomegaly without edema or infiltrate. Electronically Signed   By: Jasmine PangKim  Fujinaga M.D.   On: 06/24/2017 14:31    Procedures Procedures (including critical care time)  Medications  Ordered in ED Medications - No data to display   Initial Impression / Assessment and Plan / ED Course  I have reviewed the triage vital signs and the nursing notes.  Pertinent labs & imaging results that were available during my care of the patient were reviewed by me and considered in my medical decision making (see chart for details).     EKG reveals atrial fibrillation with controlled rate at the rate of approximately 50, exam is unremarkable and the patient is symptom-free.  I see no signs of ischemia on the EKG, he will need a troponin as well as a repeat troponin.  The patient is in agreement with the plan.  Dr. Wyline MoodBranch is the primary cardiologist, comments on history of A. fib, last EKG I have is from 10 years ago and there was normal sinus rhythm  2 neg troponins, Neg CXR Pt remained CP free. Agreeable to d/c  Final Clinical Impressions(s) / ED Diagnoses   Final diagnoses:  Intermittent chest pain    ED Discharge Orders    None       Eber HongMiller, Ashtin Rosner, MD 06/24/17 [phone]

## 2017-06-24 NOTE — ED Triage Notes (Addendum)
Chest pain

## 2017-06-26 ENCOUNTER — Ambulatory Visit (INDEPENDENT_AMBULATORY_CARE_PROVIDER_SITE_OTHER): Payer: Medicare Other | Admitting: *Deleted

## 2017-06-26 DIAGNOSIS — I4891 Unspecified atrial fibrillation: Secondary | ICD-10-CM | POA: Diagnosis not present

## 2017-06-26 DIAGNOSIS — Z5181 Encounter for therapeutic drug level monitoring: Secondary | ICD-10-CM

## 2017-06-26 LAB — POCT INR: INR: 2.4

## 2017-07-27 ENCOUNTER — Ambulatory Visit (INDEPENDENT_AMBULATORY_CARE_PROVIDER_SITE_OTHER): Payer: Medicare Other | Admitting: *Deleted

## 2017-07-27 DIAGNOSIS — Z5181 Encounter for therapeutic drug level monitoring: Secondary | ICD-10-CM | POA: Diagnosis not present

## 2017-07-27 DIAGNOSIS — I4891 Unspecified atrial fibrillation: Secondary | ICD-10-CM

## 2017-07-27 LAB — POCT INR: INR: 2.5

## 2017-07-27 NOTE — Patient Instructions (Signed)
Continue coumadin 1/2 tablet daily except 1 tablet on Mondays and Fridays Recheck in 4 weeks 

## 2017-08-10 ENCOUNTER — Telehealth: Payer: Self-pay | Admitting: *Deleted

## 2017-08-10 MED ORDER — WARFARIN SODIUM 5 MG PO TABS: ORAL_TABLET | ORAL | 3 refills | Status: DC

## 2017-08-10 NOTE — Telephone Encounter (Signed)
Warfarin RX sent to [facility].

## 2017-08-10 NOTE — Telephone Encounter (Signed)
Needs refill on Warfarin sent to [facility]/tg

## 2017-08-24 ENCOUNTER — Ambulatory Visit (INDEPENDENT_AMBULATORY_CARE_PROVIDER_SITE_OTHER): Payer: Medicare Other | Admitting: *Deleted

## 2017-08-24 DIAGNOSIS — I4891 Unspecified atrial fibrillation: Secondary | ICD-10-CM

## 2017-08-24 DIAGNOSIS — Z5181 Encounter for therapeutic drug level monitoring: Secondary | ICD-10-CM

## 2017-08-24 LAB — POCT INR: INR: 2

## 2017-08-24 NOTE — Patient Instructions (Signed)
Continue coumadin 1/2 tablet daily except 1 tablet on Mondays and Fridays.  Recheck in 6 weeks.  

## 2017-09-10 ENCOUNTER — Ambulatory Visit: Payer: Medicare Other | Admitting: Family Medicine

## 2017-09-10 ENCOUNTER — Encounter: Payer: Self-pay | Admitting: Family Medicine

## 2017-09-10 VITALS — BP 118/82 | Temp 98.0°F | Ht 69.0 in | Wt 242.6 lb

## 2017-09-10 DIAGNOSIS — J111 Influenza due to unidentified influenza virus with other respiratory manifestations: Secondary | ICD-10-CM | POA: Diagnosis not present

## 2017-09-10 MED ORDER — OSELTAMIVIR PHOSPHATE 75 MG PO CAPS: 75.0000 mg | ORAL_CAPSULE | Freq: Two times a day (BID) | ORAL | 0 refills | Status: DC

## 2017-09-10 NOTE — Progress Notes (Signed)
   Subjective:    Patient ID: Jimmy LeversJohn H Karl, male    DOB: 1930-07-13, 84 y.o.   MRN: [id number]  Cough  This is a new problem. The current episode started in the past 7 days. Associated symptoms include headaches, nasal congestion, rhinorrhea, a sore throat and wheezing. Pertinent negatives include no chest pain, chills, ear pain or fever. Treatments tried: otc cold med.   Patient with head congestion drainage coughing denies wheezing difficulty breathing or sinus pain   Review of Systems  Constitutional: Negative for activity change, chills and fever.  HENT: Positive for congestion, rhinorrhea and sore throat. Negative for ear pain.   Eyes: Negative for discharge.  Respiratory: Positive for cough and wheezing.   Cardiovascular: Negative for chest pain.  Gastrointestinal: Negative for nausea and vomiting.  Musculoskeletal: Negative for arthralgias.  Neurological: Positive for headaches.       Objective:   Physical Exam  Constitutional: He appears well-developed.  HENT:  Head: Normocephalic and atraumatic.  Mouth/Throat: Oropharynx is clear and moist. No oropharyngeal exudate.  Eyes: Right eye exhibits no discharge. Left eye exhibits no discharge.  Neck: Normal range of motion.  Cardiovascular: Normal rate, regular rhythm and normal heart sounds.  No murmur heard. Pulmonary/Chest: Effort normal and breath sounds normal. No respiratory distress. He has no wheezes. He has no rales.  Lymphadenopathy:    He has no cervical adenopathy.  Neurological: He exhibits normal muscle tone.  Skin: Skin is warm and dry.  Nursing note and vitals reviewed.  Does not appear toxic       Assessment & Plan:  .salfgl Influenza-the patient was diagnosed with influenza. Patient/family educated about the flu and warning signs to watch for. If difficulty breathing, severe neck pain and stiffness, cyanosis, disorientation, or progressive worsening then immediately get rechecked at that ER. If  progressive symptoms be certain to be rechecked. Supportive measures such as Tylenol/ibuprofen was discussed. No aspirin use in children. And influenza home care instruction sheet was given. Tamiflu prescribed warning signs discussed follow-up if problems

## 2017-09-10 NOTE — Patient Instructions (Signed)

## 2017-09-28 ENCOUNTER — Ambulatory Visit (INDEPENDENT_AMBULATORY_CARE_PROVIDER_SITE_OTHER): Payer: Medicare Other | Admitting: Family Medicine

## 2017-09-28 ENCOUNTER — Encounter: Payer: Self-pay | Admitting: Family Medicine

## 2017-09-28 VITALS — BP 120/70 | Ht 69.0 in | Wt 244.0 lb

## 2017-09-28 DIAGNOSIS — Z0001 Encounter for general adult medical examination with abnormal findings: Secondary | ICD-10-CM

## 2017-09-28 DIAGNOSIS — R7303 Prediabetes: Secondary | ICD-10-CM | POA: Diagnosis not present

## 2017-09-28 DIAGNOSIS — E7849 Other hyperlipidemia: Secondary | ICD-10-CM | POA: Diagnosis not present

## 2017-09-28 DIAGNOSIS — Z79899 Other long term (current) drug therapy: Secondary | ICD-10-CM | POA: Diagnosis not present

## 2017-09-28 DIAGNOSIS — Z0000 Encounter for general adult medical examination without abnormal findings: Secondary | ICD-10-CM

## 2017-09-28 DIAGNOSIS — I10 Essential (primary) hypertension: Secondary | ICD-10-CM | POA: Diagnosis not present

## 2017-09-28 DIAGNOSIS — Z7901 Long term (current) use of anticoagulants: Secondary | ICD-10-CM | POA: Diagnosis not present

## 2017-09-28 NOTE — Progress Notes (Signed)
Subjective:    Patient ID: [name], male    DOB: [birth year], 84 y.o.   MRN: [id number]  HPI AWV- Annual Wellness Visit  The patient was seen for their annual wellness visit. The patient's past medical history, surgical history, and family history were reviewed. Pertinent vaccines were reviewed ( tetanus, pneumonia, shingles, flu) The patient's medication list was reviewed and updated.  The height and weight were entered.  BMI recorded in electronic record elsewhere  Cognitive screening was completed. Outcome of Mini - Cog: Yes passed   Falls /depression screening electronically recorded within record elsewhere  Current tobacco usage:None (All patients who use tobacco were given written and verbal information on quitting)  Recent listing of emergency department/hospitalizations over the past year were reviewed.  current specialist the patient sees on a regular basis: dr branch   Medicare annual wellness visit patient questionnaire was reviewed.  A written screening schedule for the patient for the next 5-10 years was given. Appropriate discussion of followup regarding next visit was discussed.  Patient here for follow-up regarding cholesterol.  Patient does try to maintain a reasonable diet.  Patient does take the medication on a regular basis.  Denies missing a dose.  The patient denies any obvious side effects.  Prior blood work results reviewed with the patient.  The patient is aware of his cholesterol goals and the need to keep it under good control to lessen the risk of disease.  Patient for blood pressure check up. Patient relates compliance with meds. Todays BP reviewed with the patient. Patient denies issues with medication. Patient relates reasonable diet. Patient tries to minimize salt. Patient aware of BP goals.    Review of Systems  Constitutional: Negative for activity change, fatigue and fever.  HENT: Negative for congestion and rhinorrhea.   Respiratory:  Negative for cough and shortness of breath.   Cardiovascular: Negative for chest pain and leg swelling.  Gastrointestinal: Negative for abdominal pain, diarrhea and nausea.  Genitourinary: Negative for dysuria and hematuria.  Neurological: Negative for weakness and headaches.  Psychiatric/Behavioral: Negative for behavioral problems.       Objective:   Physical Exam  Constitutional: He appears well-developed and well-nourished.  HENT:  Head: Normocephalic and atraumatic.  Right Ear: External ear normal.  Left Ear: External ear normal.  Nose: Nose normal.  Mouth/Throat: Oropharynx is clear and moist.  Eyes: Right eye exhibits no discharge. Left eye exhibits no discharge. No scleral icterus.  Neck: Normal range of motion. Neck supple. No thyromegaly present.  Cardiovascular: Normal rate, regular rhythm and normal heart sounds.  No murmur heard. Pulmonary/Chest: Effort normal and breath sounds normal. No respiratory distress. He has no wheezes.  Abdominal: Soft. Bowel sounds are normal. He exhibits no distension and no mass. There is no tenderness.  Genitourinary: Penis normal.  Musculoskeletal: Normal range of motion. He exhibits no edema.  Lymphadenopathy:    He has no cervical adenopathy.  Neurological: He is alert. He exhibits normal muscle tone. Coordination normal.  Skin: Skin is warm and dry. No erythema.  Psychiatric: He has a normal mood and affect. His behavior is normal. Judgment normal.    Patient has large testicles he states he has been this way since his mid 30s when he acquired the mumps      Assessment & Plan:  Adult wellness-complete.wellness physical was conducted today. Importance of diet and exercise were discussed in detail. In addition to this a discussion regarding safety was also covered. We also reviewed  over immunizations and gave recommendations regarding current immunization needed for age. In addition to this additional areas were also touched on  including: Preventative health exams needed: Colonoscopy not by age criteria  Patient was advised yearly wellness exam  Patient on multiple chronic meds needs follow-up lab work before his visit with cardiology  The patient was seen today as part of an evaluation regarding hyperlipidemia. Recent lab work has been reviewed with the patient as well as the goals for good cholesterol care. In addition to this medications have been discussed the importance of compliance with diet and medications discussed as well. Patient has been informed of potential side effects of medications in the importance to notify us should any problems occur. Finally the patient is aware that poor control of cholesterol, noncompliance can dramatically increase her risk of heart attack strokes and premature death. The patient will keep regular office visits and the patient does agreed to periodic lab work.  HTN- Patient was seen today as part of a visit regarding hypertension. The importance of healthy diet and regular physical activity was discussed. The importance of compliance with medications discussed. Ideal goal is to keep blood pressure low elevated levels certainly below 140/90 when possible. The patient was counseled that keeping blood pressure under control lessen his risk of heart attack, stroke, kidney failure, and early death. The importance of regular follow-ups was discussed with the patient. Low-salt diet such as DASH recommended. Regular physical activity was recommended as well. Patient was advised to keep regular follow-ups.

## 2017-09-29 LAB — BASIC METABOLIC PANEL WITH GFR
BUN/Creatinine Ratio: 18 (ref 10–24)
BUN: 18 mg/dL (ref 8–27)
CO2: 23 mmol/L (ref 20–29)
Calcium: 9.4 mg/dL (ref 8.6–10.2)
Chloride: 105 mmol/L (ref 96–106)
Creatinine, Ser: 1.02 mg/dL (ref 0.76–1.27)
GFR calc Af Amer: 78 mL/min/{1.73_m2}
GFR calc non Af Amer: 67 mL/min/{1.73_m2}
Glucose: 105 mg/dL — ABNORMAL HIGH (ref 65–99)
Potassium: 4.9 mmol/L (ref 3.5–5.2)
Sodium: 144 mmol/L (ref 134–144)

## 2017-09-29 LAB — CBC WITH DIFFERENTIAL/PLATELET
Basophils Absolute: 0 10*3/uL (ref 0.0–0.2)
Basos: 0 %
EOS (ABSOLUTE): 0.2 10*3/uL (ref 0.0–0.4)
Eos: 2 %
Hematocrit: 40.3 % (ref 37.5–51.0)
Hemoglobin: 13.6 g/dL (ref 13.0–17.7)
Immature Grans (Abs): 0 10*3/uL (ref 0.0–0.1)
Immature Granulocytes: 0 %
Lymphocytes Absolute: 1.7 10*3/uL (ref 0.7–3.1)
Lymphs: 23 %
MCH: 32.3 pg (ref 26.6–33.0)
MCHC: 33.7 g/dL (ref 31.5–35.7)
MCV: 96 fL (ref 79–97)
Monocytes Absolute: 0.8 10*3/uL (ref 0.1–0.9)
Monocytes: 12 %
Neutrophils Absolute: 4.6 10*3/uL (ref 1.4–7.0)
Neutrophils: 63 %
Platelets: 252 10*3/uL (ref 150–379)
RBC: 4.21 x10E6/uL (ref 4.14–5.80)
RDW: 14.2 % (ref 12.3–15.4)
WBC: 7.3 10*3/uL (ref 3.4–10.8)

## 2017-09-29 LAB — LIPID PANEL
CHOL/HDL RATIO: 2.8 ratio (ref 0.0–5.0)
Cholesterol, Total: 139 mg/dL (ref 100–199)
HDL: 50 mg/dL (ref >39)
LDL Calculated: 61 mg/dL (ref 0–99)
Triglycerides: 138 mg/dL (ref 0–149)
VLDL Cholesterol Cal: 28 mg/dL (ref 5–40)

## 2017-09-29 LAB — HEPATIC FUNCTION PANEL
ALK PHOS: 99 IU/L (ref 39–117)
ALT: 10 IU/L (ref 0–44)
AST: 13 IU/L (ref 0–40)
Albumin: 4 g/dL (ref 3.5–4.7)
BILIRUBIN TOTAL: 0.6 mg/dL (ref 0.0–1.2)
BILIRUBIN, DIRECT: 0.2 mg/dL (ref 0.00–0.40)
Total Protein: 6.9 g/dL (ref 6.0–8.5)

## 2017-09-29 LAB — HEMOGLOBIN A1C
ESTIMATED AVERAGE GLUCOSE: 120 mg/dL
HEMOGLOBIN A1C: 5.8 % — AB (ref 4.8–5.6)

## 2017-09-30 ENCOUNTER — Encounter: Payer: Self-pay | Admitting: Family Medicine

## 2017-10-05 ENCOUNTER — Ambulatory Visit (INDEPENDENT_AMBULATORY_CARE_PROVIDER_SITE_OTHER): Payer: Medicare Other | Admitting: *Deleted

## 2017-10-05 DIAGNOSIS — Z5181 Encounter for therapeutic drug level monitoring: Secondary | ICD-10-CM | POA: Diagnosis not present

## 2017-10-05 DIAGNOSIS — I4891 Unspecified atrial fibrillation: Secondary | ICD-10-CM | POA: Diagnosis not present

## 2017-10-05 LAB — POCT INR: INR: 2.1

## 2017-10-05 NOTE — Patient Instructions (Signed)
Continue coumadin 1/2 tablet daily except 1 tablet on Mondays and Fridays.  Recheck in 6 weeks.  

## 2017-10-22 ENCOUNTER — Encounter: Payer: Self-pay | Admitting: Cardiology

## 2017-10-22 ENCOUNTER — Ambulatory Visit: Payer: Medicare Other | Admitting: Cardiology

## 2017-10-22 VITALS — BP 136/82 | HR 56 | Ht 69.0 in | Wt 239.0 lb

## 2017-10-22 DIAGNOSIS — I2510 Atherosclerotic heart disease of native coronary artery without angina pectoris: Secondary | ICD-10-CM

## 2017-10-22 DIAGNOSIS — I10 Essential (primary) hypertension: Secondary | ICD-10-CM

## 2017-10-22 DIAGNOSIS — I4891 Unspecified atrial fibrillation: Secondary | ICD-10-CM | POA: Diagnosis not present

## 2017-10-22 DIAGNOSIS — E782 Mixed hyperlipidemia: Secondary | ICD-10-CM

## 2017-10-22 NOTE — Progress Notes (Deleted)
Clinical Summary [name] is a 84 y.o.male  seen today for follow up of the following medical problems.   1. CAD/ICM  - prior CABG in Nov 2006 at [facility] - echo 01/2016 LVEF 45-50%, severe LVH, apical hypokinesis, indeterminate diastolic function due to afib.     - denies any chest pain, no SOB or DOE - compliant with meds.   2. Afib  -- no palpitations - no bleeding on coumadin. Has not been interested in NOACs   3. HTN  -he is compliant with bp meds  4. HL   - he is compliant with statin - 05/2016 TC 128 TG 64 HDL 53 LDL 62    SH: spends much of his time his time taking care of his wife who is blind, poor mobility at home.    Past Medical History:  Diagnosis Date  . Anticoagulant disorder ([place])   . ASCVD (arteriosclerotic cardiovascular disease)    CABG 11/06; MUGA 11/08 - 44%; 3/07 mildly decreased EF on echo  . Atrial fibrillation ([place])    Initially occurred following CABG; event recorder in 11/08 - no AF; recurred 08/2009  . CHF (congestive heart failure) ([place])   . Coronary artery disease   . Hyperlipidemia   . Hypertension   . Nephrolithiasis   . Overweight(278.02)      Allergies  Allergen Reactions  . Ibuprofen Rash    REACTION: skin rash     Current Outpatient Medications  Medication Sig Dispense Refill  . amLODipine-benazepril (LOTREL) 10-40 MG capsule TAKE 1 CAPSULE BY MOUTH ONCE DAILY. 30 capsule 6  . atorvastatin (LIPITOR) 80 MG tablet TAKE (1) TABLET BY MOUTH ONCE DAILY. 90 tablet 3  . furosemide (LASIX) 20 MG tablet TAKE (1) TABLET BY MOUTH ONCE DAILY. 30 tablet 11  . metoprolol succinate (TOPROL-XL) 50 MG 24 hr tablet TAKE 1 TABLET ONCE DAILY IMMEDIATELY FOLLOWING A MEAL. 90 tablet 3  . warfarin (COUMADIN) 5 MG tablet Take 1/2 tablet daily except 1 tablet on Mondays and Fridays or as directed 90 tablet 3   No current facility-administered medications for this visit.      Past Surgical History:  Procedure  Laterality Date  . ANGIOPLASTY  1991  . COLONOSCOPY  2008  . CORONARY ARTERY BYPASS GRAFT  04/2005     Allergies  Allergen Reactions  . Ibuprofen Rash    REACTION: skin rash      Family History  Problem Relation Age of Onset  . Hypertension Father   . Heart attack Father   . Diabetes Brother      Social History [name] reports that he has never smoked. He has never used smokeless tobacco. [name] reports that he does not drink alcohol.   Review of Systems CONSTITUTIONAL: No weight loss, fever, chills, weakness or fatigue.  HEENT: Eyes: No visual loss, blurred vision, double vision or yellow sclerae.No hearing loss, sneezing, congestion, runny nose or sore throat.  SKIN: No rash or itching.  CARDIOVASCULAR:  RESPIRATORY: No shortness of breath, cough or sputum.  GASTROINTESTINAL: No anorexia, nausea, vomiting or diarrhea. No abdominal pain or blood.  GENITOURINARY: No burning on urination, no polyuria NEUROLOGICAL: No headache, dizziness, syncope, paralysis, ataxia, numbness or tingling in the extremities. No change in bowel or bladder control.  MUSCULOSKELETAL: No muscle, back pain, joint pain or stiffness.  LYMPHATICS: No enlarged nodes. No history of splenectomy.  PSYCHIATRIC: No history of depression or anxiety.  ENDOCRINOLOGIC: No reports of sweating, cold  or heat intolerance. No polyuria or polydipsia.  [name]   Physical Examination There were no vitals filed for this visit. There were no vitals filed for this visit.  Gen: resting comfortably, no acute distress HEENT: no scleral icterus, pupils equal round and reactive, no palptable cervical adenopathy,  CV Resp: Clear to auscultation bilaterally GI: abdomen is soft, non-tender, non-distended, normal bowel sounds, no hepatosplenomegaly MSK: extremities are warm, no edema.  Skin: warm, no rash Neuro:  no focal deficits Psych: appropriate affect   Diagnostic Studies July 2011 Echo: mild to moderately  decreased LVEF (no LVEF given), apex akinetic, severe LVH, severe LAE   05/05/13 Clinic EKG: afib at 58 bpm, LAD, anteroseptal and anterolateral Q waves, inferior Q waves   05/2013 Echo Technically difficult study, severe LVH, elevated LA pressure. Difficult to evalute LV function even with contrast, appears low normal to normal.   01/2016 echo Study Conclusions  - Left ventricle: The cavity size was normal. Wall thickness was increased in a pattern of severe LVH. Systolic function was mildly reduced. The estimated ejection fraction was in the range of 45% to 50%. There is hypokinesis of the mid-apicalanteroseptal and apical myocardium. - Aortic valve: Trileaflet; moderately calcified leaflets. There was trivial regurgitation. - Aortic root: The aortic root was mildly dilated. - Mitral valve: Calcified annulus. There was trivial regurgitation. - Left atrium: The atrium was moderately dilated. - Right atrium: The atrium was mildly dilated. Central venous pressure (est): 8 mm Hg. - Tricuspid valve: There was trivial regurgitation. - Pulmonary arteries: Systolic pressure could not be accurately estimated. - Pericardium, extracardiac: There was no pericardial effusion.  Impressions:  - Severe LVH with LVEF approximately 45-50%. There is hypokinesis of the mid to apical anteroseptal wall and apex, no obvious LV mural thrombus with Definity contrast. Indeterminate diastolic function in the setting of atrial fibrillation. Moderate left atrial enlargement. MAC with trivial mitral regurgitation. Mildly dilated aortic root with sclerotic aortic valve and trivial aortic stenosis. Mildly dilated right atrium. Trivial tricuspid regurgitation.     Assessment and Plan   1. CAD -denies any symptoms, he will continue current meds - EKG in clinic shows SR, no ischemic changes  2. Afib  - CHADS2Vasc score is 4, continue anticoag - no symptoms,  continue current meds  3. HTN  - bp is at goal, continue current meds  4. Hyperlipidemia  -lipids at goal, f/u upcoming pcp labs. Continue statin         Arnoldo Lenis, M.D., F.A.C.C.

## 2017-10-22 NOTE — Patient Instructions (Signed)
Your physician wants you to follow-up in:6 months  with Dr.Branch You will receive a reminder letter in the mail two months in advance. If you don't receive a letter, please call our office to schedule the follow-up appointment.   Your physician recommends that you continue on your current medications as directed. Please refer to the Current Medication list given to you today.    If you need a refill on your cardiac medications before your next appointment, please call your pharmacy.      No lab work or tests ordered today.       Thank you for choosing [facility] !         

## 2017-10-22 NOTE — Progress Notes (Signed)
Clinical Summary [name] is a 84 y.o.male seen today for follow up of the following medical problems.   1. CAD/ICM  - prior CABG in Nov 2006 at [facility] - echo 01/2016 LVEF 45-50%, severe LVH, apical hypokinesis, indeterminate diastolic function due to afib.     - no sob/doe, no chest pain - compliant with meds  2. Afib  - Has not been interested in NOACs  - no palpitations. NO bleeding on coumadin.   3. HTN  -compliant with meds  4. HL   - he is compliant with statin 09/2017 TC 139 TG 138 HDL 50 LDL 61   SH: spends much of his time his time taking care of his wife who is blind, poor mobility at home.    Past Medical History:  Diagnosis Date  . Anticoagulant disorder ([place])   . ASCVD (arteriosclerotic cardiovascular disease)    CABG 11/06; MUGA 11/08 - 44%; 3/07 mildly decreased EF on echo  . Atrial fibrillation ([place])    Initially occurred following CABG; event recorder in 11/08 - no AF; recurred 08/2009  . CHF (congestive heart failure) ([place])   . Coronary artery disease   . Hyperlipidemia   . Hypertension   . Nephrolithiasis   . Overweight(278.02)      Allergies  Allergen Reactions  . Ibuprofen Rash    REACTION: skin rash     Current Outpatient Medications  Medication Sig Dispense Refill  . amLODipine-benazepril (LOTREL) 10-40 MG capsule TAKE 1 CAPSULE BY MOUTH ONCE DAILY. 30 capsule 6  . atorvastatin (LIPITOR) 80 MG tablet TAKE (1) TABLET BY MOUTH ONCE DAILY. 90 tablet 3  . furosemide (LASIX) 20 MG tablet TAKE (1) TABLET BY MOUTH ONCE DAILY. 30 tablet 11  . metoprolol succinate (TOPROL-XL) 50 MG 24 hr tablet TAKE 1 TABLET ONCE DAILY IMMEDIATELY FOLLOWING A MEAL. 90 tablet 3  . warfarin (COUMADIN) 5 MG tablet Take 1/2 tablet daily except 1 tablet on Mondays and Fridays or as directed 90 tablet 3   No current facility-administered medications for this visit.      Past Surgical History:  Procedure Laterality Date  . ANGIOPLASTY   1991  . COLONOSCOPY  2008  . CORONARY ARTERY BYPASS GRAFT  04/2005     Allergies  Allergen Reactions  . Ibuprofen Rash    REACTION: skin rash      Family History  Problem Relation Age of Onset  . Hypertension Father   . Heart attack Father   . Diabetes Brother      Social History [name] reports that he has never smoked. He has never used smokeless tobacco. [name] reports that he does not drink alcohol.   Review of Systems CONSTITUTIONAL: No weight loss, fever, chills, weakness or fatigue.  HEENT: Eyes: No visual loss, blurred vision, double vision or yellow sclerae.No hearing loss, sneezing, congestion, runny nose or sore throat.  SKIN: No rash or itching.  CARDIOVASCULAR: per hpi RESPIRATORY: No shortness of breath, cough or sputum.  GASTROINTESTINAL: No anorexia, nausea, vomiting or diarrhea. No abdominal pain or blood.  GENITOURINARY: No burning on urination, no polyuria NEUROLOGICAL: No headache, dizziness, syncope, paralysis, ataxia, numbness or tingling in the extremities. No change in bowel or bladder control.  MUSCULOSKELETAL: No muscle, back pain, joint pain or stiffness.  LYMPHATICS: No enlarged nodes. No history of splenectomy.  PSYCHIATRIC: No history of depression or anxiety.  ENDOCRINOLOGIC: No reports of sweating, cold or heat intolerance. No polyuria or polydipsia.  [name]  Physical Examination Vitals:   10/22/17 0920  BP: 136/82  Pulse: (!) 56  SpO2: 97%   Vitals:   10/22/17 0920  Weight: 239 lb (108.4 kg)  Height: _0  (1.753 m)    Gen: resting comfortably, no acute distress HEENT: no scleral icterus, pupils equal round and reactive, no palptable cervical adenopathy,  CV: RRR, no m/r/g, no jvd Resp: Clear to auscultation bilaterally GI: abdomen is soft, non-tender, non-distended, normal bowel sounds, no hepatosplenomegaly MSK: extremities are warm, no edema.  Skin: warm, no rash Neuro:  no focal deficits Psych: appropriate  affect   Diagnostic Studies July 2011 Echo: mild to moderately decreased LVEF (no LVEF given), apex akinetic, severe LVH, severe LAE   05/05/13 Clinic EKG: afib at 58 bpm, LAD, anteroseptal and anterolateral Q waves, inferior Q waves   05/2013 Echo Technically difficult study, severe LVH, elevated LA pressure. Difficult to evalute LV function even with contrast, appears low normal to normal.   01/2016 echo Study Conclusions  - Left ventricle: The cavity size was normal. Wall thickness was increased in a pattern of severe LVH. Systolic function was mildly reduced. The estimated ejection fraction was in the range of 45% to 50%. There is hypokinesis of the mid-apicalanteroseptal and apical myocardium. - Aortic valve: Trileaflet; moderately calcified leaflets. There was trivial regurgitation. - Aortic root: The aortic root was mildly dilated. - Mitral valve: Calcified annulus. There was trivial regurgitation. - Left atrium: The atrium was moderately dilated. - Right atrium: The atrium was mildly dilated. Central venous pressure (est): 8 mm Hg. - Tricuspid valve: There was trivial regurgitation. - Pulmonary arteries: Systolic pressure could not be accurately estimated. - Pericardium, extracardiac: There was no pericardial effusion.  Impressions:  - Severe LVH with LVEF approximately 45-50%. There is hypokinesis of the mid to apical anteroseptal wall and apex, no obvious LV mural thrombus with Definity contrast. Indeterminate diastolic function in the setting of atrial fibrillation. Moderate left atrial enlargement. MAC with trivial mitral regurgitation. Mildly dilated aortic root with sclerotic aortic valve and trivial aortic stenosis. Mildly dilated right atrium. Trivial tricuspid regurgitation.       Assessment and Plan  1. CAD -no symptoms, continue current meds  2. Afib  - CHADS2Vasc score is 4, continue anticoag - no recent  symptoms, he will contniue current meds  3. HTN  - reasonable control, continue current meds  4. Hyperlipidemia  -at goal, continue statin    F/u 6 months       Arnoldo Lenis, M.D.

## 2017-10-28 ENCOUNTER — Encounter: Payer: Self-pay | Admitting: Cardiology

## 2017-11-10 ENCOUNTER — Other Ambulatory Visit: Payer: Self-pay | Admitting: Cardiology

## 2017-11-16 ENCOUNTER — Ambulatory Visit (INDEPENDENT_AMBULATORY_CARE_PROVIDER_SITE_OTHER): Payer: Medicare Other | Admitting: *Deleted

## 2017-11-16 DIAGNOSIS — I4891 Unspecified atrial fibrillation: Secondary | ICD-10-CM | POA: Diagnosis not present

## 2017-11-16 DIAGNOSIS — Z5181 Encounter for therapeutic drug level monitoring: Secondary | ICD-10-CM | POA: Diagnosis not present

## 2017-11-16 LAB — POCT INR: INR: 2.3

## 2017-11-16 NOTE — Patient Instructions (Signed)
Continue coumadin 1/2 tablet daily except 1 tablet on Mondays and Fridays.  Recheck in 6 weeks.  

## 2017-12-01 ENCOUNTER — Other Ambulatory Visit: Payer: Self-pay | Admitting: Cardiology

## 2017-12-28 ENCOUNTER — Ambulatory Visit (INDEPENDENT_AMBULATORY_CARE_PROVIDER_SITE_OTHER): Payer: Medicare Other | Admitting: *Deleted

## 2017-12-28 DIAGNOSIS — Z5181 Encounter for therapeutic drug level monitoring: Secondary | ICD-10-CM | POA: Diagnosis not present

## 2017-12-28 DIAGNOSIS — I4891 Unspecified atrial fibrillation: Secondary | ICD-10-CM | POA: Diagnosis not present

## 2017-12-28 LAB — POCT INR: INR: 2.5 (ref 2.0–3.0)

## 2017-12-28 NOTE — Patient Instructions (Signed)
Continue coumadin 1/2 tablet daily except 1 tablet on Mondays and Fridays.  Recheck in 6 weeks.  

## 2018-02-01 ENCOUNTER — Other Ambulatory Visit: Payer: Self-pay

## 2018-02-01 ENCOUNTER — Encounter (HOSPITAL_COMMUNITY): Payer: Self-pay

## 2018-02-01 ENCOUNTER — Encounter (HOSPITAL_COMMUNITY)
Admission: RE | Admit: 2018-02-01 | Discharge: 2018-02-01 | Disposition: A | Discharge status: Home / Self Care | Payer: Medicare Other | Source: Ambulatory Visit | Attending: Ophthalmology | Admitting: Ophthalmology

## 2018-02-01 DIAGNOSIS — H269 Unspecified cataract: Secondary | ICD-10-CM | POA: Diagnosis not present

## 2018-02-01 DIAGNOSIS — Z01818 Encounter for other preprocedural examination: Secondary | ICD-10-CM | POA: Diagnosis not present

## 2018-02-01 HISTORY — DX: Acute myocardial infarction, unspecified: I21.9

## 2018-02-01 HISTORY — DX: Personal history of urinary calculi: Z87.442

## 2018-02-01 HISTORY — DX: Unspecified hearing loss, unspecified ear: H91.90

## 2018-02-01 LAB — CBC WITH DIFFERENTIAL/PLATELET
BASOS ABS: 0 10*3/uL (ref 0.0–0.1)
Basophils Relative: 0 %
Eosinophils Absolute: 0.1 10*3/uL (ref 0.0–0.7)
Eosinophils Relative: 2 %
HEMATOCRIT: 42.5 % (ref 39.0–52.0)
HEMOGLOBIN: 14.7 g/dL (ref 13.0–17.0)
Lymphocytes Relative: 20 %
Lymphs Abs: 1.5 10*3/uL (ref 0.7–4.0)
MCH: 33.9 pg (ref 26.0–34.0)
MCHC: 34.6 g/dL (ref 30.0–36.0)
MCV: 98.2 fL (ref 78.0–100.0)
Monocytes Absolute: 1 10*3/uL (ref 0.1–1.0)
Monocytes Relative: 13 %
NEUTROS ABS: 4.9 10*3/uL (ref 1.7–7.7)
NEUTROS PCT: 65 %
Platelets: 212 10*3/uL (ref 150–400)
RBC: 4.33 MIL/uL (ref 4.22–5.81)
RDW: 14.1 % (ref 11.5–15.5)
WBC: 7.5 10*3/uL (ref 4.0–10.5)

## 2018-02-01 LAB — BASIC METABOLIC PANEL
ANION GAP: 5 (ref 5–15)
BUN: 19 mg/dL (ref 8–23)
CHLORIDE: 104 mmol/L (ref 98–111)
CO2: 28 mmol/L (ref 22–32)
Calcium: 8.8 mg/dL — ABNORMAL LOW (ref 8.9–10.3)
Creatinine, Ser: 0.87 mg/dL (ref 0.61–1.24)
GFR calc Af Amer: >60 mL/min (ref >60)
GFR calc non Af Amer: >60 mL/min (ref >60)
GLUCOSE: 97 mg/dL (ref 70–99)
POTASSIUM: 3.8 mmol/L (ref 3.5–5.1)
Sodium: 137 mmol/L (ref 135–145)

## 2018-02-01 NOTE — Patient Instructions (Signed)
Your procedure is scheduled on: 02/22/2018  Report to [facility] at  930   AM.  Call this number if you have problems the morning of surgery: [phone]   Do not eat food or drink liquids :After Midnight.      Take these medicines the morning of surgery with A SIP OF WATER: amlodipine. Metoprolol.   Do not wear jewelry, make-up or nail polish.  Do not wear lotions, powders, or perfumes. You may wear deodorant.  Do not shave 48 hours prior to surgery.  Do not bring valuables to the hospital.  Contacts, dentures or bridgework may not be worn into surgery.  Leave suitcase in the car. After surgery it may be brought to your room.  For patients admitted to the hospital, checkout time is 11:00 AM the day of discharge.   Patients discharged the day of surgery will not be allowed to drive home.  :     Please read over the following fact sheets that you were given: Coughing and Deep Breathing, Surgical Site Infection Prevention, Anesthesia Post-op Instructions and Care and Recovery After Surgery    Cataract A cataract is a clouding of the lens of the eye. When a lens becomes cloudy, vision is reduced based on the degree and nature of the clouding. Many cataracts reduce vision to some degree. Some cataracts make people more near-sighted as they develop. Other cataracts increase glare. Cataracts that are ignored and become worse can sometimes look white. The white color can be seen through the pupil. CAUSES   Aging. However, cataracts may occur at any age, even in newborns.   Certain drugs.   Trauma to the eye.   Certain diseases such as diabetes.   Specific eye diseases such as chronic inflammation inside the eye or a sudden attack of a rare form of glaucoma.   Inherited or acquired medical problems.  SYMPTOMS   Gradual, progressive drop in vision in the affected eye.   Severe, rapid visual loss. This most often happens when trauma is the cause.  DIAGNOSIS  To detect a cataract, an  eye doctor examines the lens. Cataracts are best diagnosed with an exam of the eyes with the pupils enlarged (dilated) by drops.  TREATMENT  For an early cataract, vision may improve by using different eyeglasses or stronger lighting. If that does not help your vision, surgery is the only effective treatment. A cataract needs to be surgically removed when vision loss interferes with your everyday activities, such as driving, reading, or watching TV. A cataract may also have to be removed if it prevents examination or treatment of another eye problem. Surgery removes the cloudy lens and usually replaces it with a substitute lens (intraocular lens, IOL).  At a time when both you and your doctor agree, the cataract will be surgically removed. If you have cataracts in both eyes, only one is usually removed at a time. This allows the operated eye to heal and be out of danger from any possible problems after surgery (such as infection or poor wound healing). In rare cases, a cataract may be doing damage to your eye. In these cases, your caregiver may advise surgical removal right away. The vast majority of people who have cataract surgery have better vision afterward. HOME CARE INSTRUCTIONS  If you are not planning surgery, you may be asked to do the following:  Use different eyeglasses.   Use stronger or brighter lighting.   Ask your eye doctor about reducing your medicine  dose or changing medicines if it is thought that a medicine caused your cataract. Changing medicines does not make the cataract go away on its own.   Become familiar with your surroundings. Poor vision can lead to injury. Avoid bumping into things on the affected side. You are at a higher risk for tripping or falling.   Exercise extreme care when driving or operating machinery.   Wear sunglasses if you are sensitive to bright light or experiencing problems with glare.  SEEK IMMEDIATE MEDICAL CARE IF:   You have a worsening or sudden  vision loss.   You notice redness, swelling, or increasing pain in the eye.   You have a fever.  Document Released: 06/16/2005 Document Revised: 06/05/2011 Document Reviewed: 02/07/2011 [facility] Patient Information 2012 [place].PATIENT INSTRUCTIONS POST-ANESTHESIA  IMMEDIATELY FOLLOWING SURGERY:  Do not drive or operate machinery for the first twenty four hours after surgery.  Do not make any important decisions for twenty four hours after surgery or while taking narcotic pain medications or sedatives.  If you develop intractable nausea and vomiting or a severe headache please notify your doctor immediately.  FOLLOW-UP:  Please make an appointment with your surgeon as instructed. You do not need to follow up with anesthesia unless specifically instructed to do so.  WOUND CARE INSTRUCTIONS (if applicable):  Keep a dry clean dressing on the anesthesia/puncture wound site if there is drainage.  Once the wound has quit draining you may leave it open to air.  Generally you should leave the bandage intact for twenty four hours unless there is drainage.  If the epidural site drains for more than 36-48 hours please call the anesthesia department.  QUESTIONS?:  Please feel free to call your physician or the hospital operator if you have any questions, and they will be happy to assist you.

## 2018-02-08 ENCOUNTER — Ambulatory Visit (INDEPENDENT_AMBULATORY_CARE_PROVIDER_SITE_OTHER): Payer: Medicare Other | Admitting: *Deleted

## 2018-02-08 DIAGNOSIS — Z5181 Encounter for therapeutic drug level monitoring: Secondary | ICD-10-CM | POA: Diagnosis not present

## 2018-02-08 DIAGNOSIS — I4891 Unspecified atrial fibrillation: Secondary | ICD-10-CM | POA: Diagnosis not present

## 2018-02-08 LAB — POCT INR: INR: 2.6 (ref 2.0–3.0)

## 2018-02-08 NOTE — Patient Instructions (Signed)
Continue coumadin 1/2 tablet daily except 1 tablet on Mondays and Fridays.  Recheck in 6 weeks.

## 2018-02-22 ENCOUNTER — Ambulatory Visit (HOSPITAL_COMMUNITY): Payer: Medicare Other | Admitting: Anesthesiology

## 2018-02-22 ENCOUNTER — Ambulatory Visit (HOSPITAL_COMMUNITY)
Admission: RE | Admit: 2018-02-22 | Discharge: 2018-02-22 | Disposition: A | Discharge status: Home / Self Care | Payer: Medicare Other | Source: Ambulatory Visit | Attending: Ophthalmology | Admitting: Ophthalmology

## 2018-02-22 ENCOUNTER — Encounter (HOSPITAL_COMMUNITY): Payer: Self-pay | Admitting: *Deleted

## 2018-02-22 ENCOUNTER — Encounter (HOSPITAL_COMMUNITY)
Admission: RE | Disposition: A | Discharge status: Home / Self Care | Payer: Self-pay | Source: Ambulatory Visit | Attending: Ophthalmology

## 2018-02-22 DIAGNOSIS — H25811 Combined forms of age-related cataract, right eye: Secondary | ICD-10-CM | POA: Diagnosis present

## 2018-02-22 DIAGNOSIS — I110 Hypertensive heart disease with heart failure: Secondary | ICD-10-CM | POA: Insufficient documentation

## 2018-02-22 DIAGNOSIS — I509 Heart failure, unspecified: Secondary | ICD-10-CM | POA: Insufficient documentation

## 2018-02-22 DIAGNOSIS — I252 Old myocardial infarction: Secondary | ICD-10-CM | POA: Insufficient documentation

## 2018-02-22 DIAGNOSIS — Z951 Presence of aortocoronary bypass graft: Secondary | ICD-10-CM | POA: Diagnosis not present

## 2018-02-22 HISTORY — PX: CATARACT EXTRACTION W/PHACO: SHX586

## 2018-02-22 SURGERY — PHACOEMULSIFICATION, CATARACT, WITH IOL INSERTION
Anesthesia: Monitor Anesthesia Care | Site: Eye | Laterality: Right

## 2018-02-22 MED ORDER — PROVISC 10 MG/ML IO SOLN
INTRAOCULAR | Status: DC | PRN
  Administered 2018-02-22: 0.85 mL via INTRAOCULAR

## 2018-02-22 MED ORDER — TETRACAINE HCL 0.5 % OP SOLN
1.0000 [drp] | OPHTHALMIC | Status: AC
  Administered 2018-02-22 (×3): 1 [drp] via OPHTHALMIC

## 2018-02-22 MED ORDER — LIDOCAINE HCL (PF) 1 % IJ SOLN
INTRAMUSCULAR | Status: DC | PRN
  Administered 2018-02-22: .5 mL

## 2018-02-22 MED ORDER — BSS IO SOLN
INTRAOCULAR | Status: DC | PRN
  Administered 2018-02-22: 15 mL

## 2018-02-22 MED ORDER — PHENYLEPHRINE HCL 2.5 % OP SOLN
1.0000 [drp] | OPHTHALMIC | Status: AC
  Administered 2018-02-22 (×3): 1 [drp] via OPHTHALMIC

## 2018-02-22 MED ORDER — HYDROCODONE-ACETAMINOPHEN 7.5-325 MG PO TABS: 1.0000 | ORAL_TABLET | Freq: Once | ORAL | Status: DC | PRN

## 2018-02-22 MED ORDER — EPINEPHRINE PF 1 MG/ML IJ SOLN
INTRAOCULAR | Status: DC | PRN
  Administered 2018-02-22: 500 mL

## 2018-02-22 MED ORDER — POVIDONE-IODINE 5 % OP SOLN
OPHTHALMIC | Status: DC | PRN
  Administered 2018-02-22: 1 via OPHTHALMIC

## 2018-02-22 MED ORDER — CYCLOPENTOLATE-PHENYLEPHRINE 0.2-1 % OP SOLN
1.0000 [drp] | OPHTHALMIC | Status: AC
  Administered 2018-02-22 (×3): 1 [drp] via OPHTHALMIC

## 2018-02-22 MED ORDER — LIDOCAINE HCL 3.5 % OP GEL
1.0000 "application " | Freq: Once | OPHTHALMIC | Status: AC
  Administered 2018-02-22: 1 via OPHTHALMIC

## 2018-02-22 MED ORDER — LACTATED RINGERS IV SOLN
INTRAVENOUS | Status: DC
  Administered 2018-02-22: 1000 mL via INTRAVENOUS

## 2018-02-22 MED ORDER — NEOMYCIN-POLYMYXIN-DEXAMETH 3.5-10000-0.1 OP SUSP
OPHTHALMIC | Status: DC | PRN
  Administered 2018-02-22: 2 [drp] via OPHTHALMIC

## 2018-02-22 MED ORDER — EPINEPHRINE PF 1 MG/ML IJ SOLN
INTRAMUSCULAR | Status: AC
  Filled 2018-02-22: qty 1

## 2018-02-22 MED ORDER — FENTANYL CITRATE (PF) 100 MCG/2ML IJ SOLN: 25.0000 ug | INTRAMUSCULAR | Status: DC | PRN

## 2018-02-22 SURGICAL SUPPLY — 12 items
CLOTH BEACON ORANGE TIMEOUT ST (SAFETY) ×2 IMPLANT
EYE SHIELD UNIVERSAL CLEAR (GAUZE/BANDAGES/DRESSINGS) ×2 IMPLANT
GLOVE BIOGEL PI IND STRL 6.5 (GLOVE) IMPLANT
GLOVE BIOGEL PI IND STRL 7.0 (GLOVE) IMPLANT
GLOVE BIOGEL PI INDICATOR 6.5 (GLOVE) ×2
GLOVE BIOGEL PI INDICATOR 7.0 (GLOVE) ×2
LENS ALC ACRYL/TECN (Ophthalmic Related) ×2 IMPLANT
PAD ARMBOARD 7.5X6 YLW CONV (MISCELLANEOUS) ×2 IMPLANT
SYRINGE LUER LOK 1CC (MISCELLANEOUS) ×2 IMPLANT
TAPE SURG TRANSPORE 1 IN (GAUZE/BANDAGES/DRESSINGS) IMPLANT
TAPE SURGICAL TRANSPORE 1 IN (GAUZE/BANDAGES/DRESSINGS) ×2
WATER STERILE IRR 250ML POUR (IV SOLUTION) ×2 IMPLANT

## 2018-02-22 NOTE — H&P (Signed)
I have reviewed the H&P, the patient was re-examined, and I have identified no interval changes in medical condition and plan of care since the history and physical of record  

## 2018-02-22 NOTE — Anesthesia Procedure Notes (Signed)
Procedure Name: MAC Date/Time: 02/22/2018 10:43 AM Performed by: Vista Deck, CRNA Pre-anesthesia Checklist: Patient identified, Emergency Drugs available, Suction available, Timeout performed and Patient being monitored Patient Re-evaluated:Patient Re-evaluated prior to induction Oxygen Delivery Method: Nasal Cannula

## 2018-02-22 NOTE — Transfer of Care (Signed)
Immediate Anesthesia Transfer of Care Note  Patient: [name]  Procedure(s) Performed: CATARACT EXTRACTION PHACO AND INTRAOCULAR LENS PLACEMENT RIGHT EYE CDE=16.62 (Right Eye)  Patient Location: Short Stay  Anesthesia Type:MAC  Level of Consciousness: awake, alert  and patient cooperative  Airway & Oxygen Therapy: Patient Spontanous Breathing  Post-op Assessment: Report given to RN and Post -op Vital signs reviewed and stable  Post vital signs: Reviewed and stable  Last Vitals:  Vitals Value Taken Time  BP    Temp    Pulse    Resp    SpO2      Last Pain:  Vitals:   02/22/18 1006  TempSrc: Oral  PainSc: 0-No pain         Complications: No apparent anesthesia complications

## 2018-02-22 NOTE — Op Note (Signed)
Date of Admission: 02/22/2018  Date of Surgery: 02/22/2018  Pre-Op Dx: Cataract Right  Eye  Post-Op Dx: Senile Combined Cataract  Right  Eye,  Dx Code U04.540  Surgeon: Tonny Branch, M.D.  Assistants: None  Anesthesia: Topical with MAC  Indications: Painless, progressive loss of vision with compromise of daily activities.  Surgery: Cataract Extraction with Intraocular lens Implant Right Eye  Discription: The patient had dilating drops and viscous lidocaine placed into the Right eye in the pre-op holding area. After transfer to the operating room, a time out was performed. The patient was then prepped and draped. Beginning with a 22m blade a paracentesis port was made at the surgeon's 2 o'clock position. The anterior chamber was then filled with 1% non-preserved lidocaine. This was followed by filling the anterior chamber with Provisc.  A 2.465mkeratome blade was used to make a clear corneal incision at the temporal limbus.  A bent cystatome needle was used to create a continuous tear capsulotomy. Hydrodissection was performed with balanced salt solution on a Fine canula. The lens nucleus was then removed using the phacoemulsification handpiece. Residual cortex was removed with the I&A handpiece. The anterior chamber and capsular bag were refilled with Provisc. A posterior chamber intraocular lens was placed into the capsular bag with it's injector. The implant was positioned with the Kuglan hook. The Provisc was then removed from the anterior chamber and capsular bag with the I&A handpiece. Stromal hydration of the main incision and paracentesis port was performed with BSS on a Fine canula. The wounds were tested for leak which was negative. The patient tolerated the procedure well. There were no operative complications. The patient was then transferred to the recovery room in stable condition.  Complications: None  Specimen: None  EBL: None  Prosthetic device: J&J Technis, PCB00, power 15.0,  SN [id number]

## 2018-02-22 NOTE — Discharge Instructions (Signed)
Monitored Anesthesia Care, Care After  These instructions provide you with information about caring for yourself after your procedure. Your health care provider may also give you more specific instructions. Your treatment has been planned according to current medical practices, but problems sometimes occur. Call your health care provider if you have any problems or questions after your procedure.  What can I expect after the procedure?  After your procedure, it is common to:   Feel sleepy for several hours.   Feel clumsy and have poor balance for several hours.   Feel forgetful about what happened after the procedure.   Have poor judgment for several hours.   Feel nauseous or vomit.   Have a sore throat if you had a breathing tube during the procedure.    Follow these instructions at home:  For at least 24 hours after the procedure:     Do not:  ? Participate in activities in which you could fall or become injured.  ? Drive.  ? Use heavy machinery.  ? Drink alcohol.  ? Take sleeping pills or medicines that cause drowsiness.  ? Make important decisions or sign legal documents.  ? Take care of children on your own.   Rest.  Eating and drinking   Follow the diet that is recommended by your health care provider.   If you vomit, drink water, juice, or soup when you can drink without vomiting.   Make sure you have little or no nausea before eating solid foods.  General instructions   Have a responsible adult stay with you until you are awake and alert.   Take over-the-counter and prescription medicines only as told by your health care provider.   If you smoke, do not smoke without supervision.   Keep all follow-up visits as told by your health care provider. This is important.  Contact a health care provider if:   You keep feeling nauseous or you keep vomiting.   You feel light-headed.   You develop a rash.   You have a fever.  Get help right away if:   You have trouble breathing.  This information is  not intended to replace advice given to you by your health care provider. Make sure you discuss any questions you have with your health care provider.  Document Released: 10/07/2015 Document Revised: 02/06/2016 Document Reviewed: 10/07/2015  [employer]vier Interactive Patient Education  2018 [employer]

## 2018-02-22 NOTE — Anesthesia Postprocedure Evaluation (Signed)
Anesthesia Post Note  Patient: [name]  Procedure(s) Performed: CATARACT EXTRACTION PHACO AND INTRAOCULAR LENS PLACEMENT RIGHT EYE CDE=16.62 (Right Eye)  Patient location during evaluation: Short Stay Anesthesia Type: MAC Level of consciousness: awake and alert and patient cooperative Pain management: pain level controlled Vital Signs Assessment: post-procedure vital signs reviewed and stable Respiratory status: spontaneous breathing Cardiovascular status: stable Postop Assessment: no apparent nausea or vomiting Anesthetic complications: no     Last Vitals:  Vitals:   02/22/18 1006 02/22/18 1103  BP: 122/61 (!) 160/61  Pulse: (!) 51 (!) 58  Resp: 18 18  Temp: 36.7 C   SpO2: 97% 96%    Last Pain:  Vitals:   02/22/18 1103  TempSrc:   PainSc: 0-No pain                 Tyree Vandruff

## 2018-02-22 NOTE — Anesthesia Preprocedure Evaluation (Signed)
Anesthesia Evaluation  Patient identified by MRN, date of birth, ID band Patient awake    Reviewed: Allergy & Precautions, NPO status , Patient's Chart, lab work & pertinent test results  Airway Mallampati: II  TM Distance: >3 FB Neck ROM: Full    Dental no notable dental hx. (+) Teeth Intact, Edentulous Upper   Pulmonary neg pulmonary ROS,    Pulmonary exam normal breath sounds clear to auscultation       Cardiovascular Exercise Tolerance: Poor hypertension, Pt. on medications + CAD, + Past MI and +CHF  (-) Cardiac Stents negative cardio ROS Normal cardiovascular exam+ dysrhythmias Atrial Fibrillation II Rhythm:Regular Rate:Normal  CABG in 1972  States was CV x1 without success in 1972 Denies recent CP   Neuro/Psych negative neurological ROS  negative psych ROS   GI/Hepatic negative GI ROS, Neg liver ROS,   Endo/Other  negative endocrine ROS  Renal/GU Renal InsufficiencyRenal diseasenegative Renal ROS  negative genitourinary   Musculoskeletal negative musculoskeletal ROS (+)   Abdominal   Peds negative pediatric ROS (+)  Hematology negative hematology ROS (+)   Anesthesia Other Findings   Reproductive/Obstetrics negative OB ROS                             Anesthesia Physical Anesthesia Plan  ASA: III  Anesthesia Plan: MAC   Post-op Pain Management:    Induction: Intravenous  PONV Risk Score and Plan:   Airway Management Planned: Nasal Cannula  Additional Equipment:   Intra-op Plan:   Post-operative Plan:   Informed Consent:   Dental advisory given  Plan Discussed with: CRNA  Anesthesia Plan Comments:         Anesthesia Quick Evaluation

## 2018-02-23 ENCOUNTER — Encounter (HOSPITAL_COMMUNITY): Payer: Self-pay | Admitting: Ophthalmology

## 2018-02-23 ENCOUNTER — Other Ambulatory Visit: Payer: Self-pay | Admitting: Cardiology

## 2018-03-23 ENCOUNTER — Encounter (HOSPITAL_COMMUNITY)
Admission: RE | Admit: 2018-03-23 | Discharge: 2018-03-23 | Disposition: A | Discharge status: Home / Self Care | Payer: Medicare Other | Source: Ambulatory Visit | Attending: Ophthalmology | Admitting: Ophthalmology

## 2018-03-23 ENCOUNTER — Encounter (HOSPITAL_COMMUNITY): Payer: Self-pay

## 2018-03-24 ENCOUNTER — Ambulatory Visit (INDEPENDENT_AMBULATORY_CARE_PROVIDER_SITE_OTHER): Payer: Medicare Other | Admitting: *Deleted

## 2018-03-24 DIAGNOSIS — I4891 Unspecified atrial fibrillation: Secondary | ICD-10-CM

## 2018-03-24 DIAGNOSIS — Z5181 Encounter for therapeutic drug level monitoring: Secondary | ICD-10-CM

## 2018-03-24 LAB — POCT INR: INR: 2.7 (ref 2.0–3.0)

## 2018-03-24 NOTE — Patient Instructions (Signed)
Continue coumadin 1/2 tablet daily except 1 tablet on Mondays and Fridays.  Recheck in 6 weeks.  

## 2018-03-29 ENCOUNTER — Other Ambulatory Visit: Payer: Self-pay

## 2018-03-29 ENCOUNTER — Ambulatory Visit (HOSPITAL_COMMUNITY): Payer: Medicare Other | Admitting: Anesthesiology

## 2018-03-29 ENCOUNTER — Encounter (HOSPITAL_COMMUNITY): Payer: Self-pay | Admitting: *Deleted

## 2018-03-29 ENCOUNTER — Ambulatory Visit (HOSPITAL_COMMUNITY)
Admission: RE | Admit: 2018-03-29 | Discharge: 2018-03-29 | Disposition: A | Discharge status: Home / Self Care | Payer: Medicare Other | Source: Ambulatory Visit | Attending: Ophthalmology | Admitting: Ophthalmology

## 2018-03-29 ENCOUNTER — Encounter (HOSPITAL_COMMUNITY)
Admission: RE | Disposition: A | Discharge status: Home / Self Care | Payer: Self-pay | Source: Ambulatory Visit | Attending: Ophthalmology

## 2018-03-29 DIAGNOSIS — I2510 Atherosclerotic heart disease of native coronary artery without angina pectoris: Secondary | ICD-10-CM | POA: Insufficient documentation

## 2018-03-29 DIAGNOSIS — I252 Old myocardial infarction: Secondary | ICD-10-CM | POA: Diagnosis not present

## 2018-03-29 DIAGNOSIS — Z886 Allergy status to analgesic agent status: Secondary | ICD-10-CM | POA: Diagnosis not present

## 2018-03-29 DIAGNOSIS — I4891 Unspecified atrial fibrillation: Secondary | ICD-10-CM | POA: Insufficient documentation

## 2018-03-29 DIAGNOSIS — Z7901 Long term (current) use of anticoagulants: Secondary | ICD-10-CM | POA: Diagnosis not present

## 2018-03-29 DIAGNOSIS — I255 Ischemic cardiomyopathy: Secondary | ICD-10-CM | POA: Diagnosis not present

## 2018-03-29 DIAGNOSIS — Z951 Presence of aortocoronary bypass graft: Secondary | ICD-10-CM | POA: Diagnosis not present

## 2018-03-29 DIAGNOSIS — I110 Hypertensive heart disease with heart failure: Secondary | ICD-10-CM | POA: Diagnosis not present

## 2018-03-29 DIAGNOSIS — H25812 Combined forms of age-related cataract, left eye: Secondary | ICD-10-CM | POA: Insufficient documentation

## 2018-03-29 DIAGNOSIS — H269 Unspecified cataract: Secondary | ICD-10-CM | POA: Diagnosis present

## 2018-03-29 DIAGNOSIS — I509 Heart failure, unspecified: Secondary | ICD-10-CM | POA: Insufficient documentation

## 2018-03-29 HISTORY — PX: CATARACT EXTRACTION W/PHACO: SHX586

## 2018-03-29 SURGERY — PHACOEMULSIFICATION, CATARACT, WITH IOL INSERTION
Anesthesia: Monitor Anesthesia Care | Site: Eye | Laterality: Left

## 2018-03-29 MED ORDER — LIDOCAINE HCL 3.5 % OP GEL: 1.0000 "application " | Freq: Once | OPHTHALMIC | Status: DC

## 2018-03-29 MED ORDER — PROVISC 10 MG/ML IO SOLN
INTRAOCULAR | Status: DC | PRN
  Administered 2018-03-29: 0.85 mL via INTRAOCULAR

## 2018-03-29 MED ORDER — LACTATED RINGERS IV SOLN
INTRAVENOUS | Status: DC
  Administered 2018-03-29: 08:00:00 via INTRAVENOUS

## 2018-03-29 MED ORDER — BSS IO SOLN
INTRAOCULAR | Status: DC | PRN
  Administered 2018-03-29: 15 mL

## 2018-03-29 MED ORDER — PHENYLEPHRINE HCL 2.5 % OP SOLN
1.0000 [drp] | OPHTHALMIC | Status: AC
  Administered 2018-03-29 (×3): 1 [drp] via OPHTHALMIC

## 2018-03-29 MED ORDER — TETRACAINE HCL 0.5 % OP SOLN
1.0000 [drp] | OPHTHALMIC | Status: AC
  Administered 2018-03-29 (×3): 1 [drp] via OPHTHALMIC

## 2018-03-29 MED ORDER — CYCLOPENTOLATE-PHENYLEPHRINE 0.2-1 % OP SOLN
1.0000 [drp] | OPHTHALMIC | Status: AC
  Administered 2018-03-29 (×3): 1 [drp] via OPHTHALMIC

## 2018-03-29 MED ORDER — LIDOCAINE HCL (PF) 1 % IJ SOLN
INTRAMUSCULAR | Status: DC | PRN
  Administered 2018-03-29: .7 mL

## 2018-03-29 MED ORDER — NEOMYCIN-POLYMYXIN-DEXAMETH 3.5-10000-0.1 OP SUSP
OPHTHALMIC | Status: DC | PRN
  Administered 2018-03-29: 2 [drp] via OPHTHALMIC

## 2018-03-29 MED ORDER — POVIDONE-IODINE 5 % OP SOLN
OPHTHALMIC | Status: DC | PRN
  Administered 2018-03-29: 1 via OPHTHALMIC

## 2018-03-29 MED ORDER — EPINEPHRINE PF 1 MG/ML IJ SOLN
INTRAOCULAR | Status: DC | PRN
  Administered 2018-03-29: 500 mL

## 2018-03-29 SURGICAL SUPPLY — 12 items
CLOTH BEACON ORANGE TIMEOUT ST (SAFETY) ×1 IMPLANT
EYE SHIELD UNIVERSAL CLEAR (GAUZE/BANDAGES/DRESSINGS) ×1 IMPLANT
GLOVE BIOGEL PI IND STRL 6.5 (GLOVE) IMPLANT
GLOVE BIOGEL PI IND STRL 7.0 (GLOVE) IMPLANT
GLOVE BIOGEL PI INDICATOR 6.5 (GLOVE) ×1
GLOVE BIOGEL PI INDICATOR 7.0 (GLOVE) ×1
LENS ALC ACRYL/TECN (Ophthalmic Related) ×1 IMPLANT
PAD ARMBOARD 7.5X6 YLW CONV (MISCELLANEOUS) ×1 IMPLANT
SYRINGE LUER LOK 1CC (MISCELLANEOUS) ×1 IMPLANT
TAPE SURG TRANSPORE 1 IN (GAUZE/BANDAGES/DRESSINGS) IMPLANT
TAPE SURGICAL TRANSPORE 1 IN (GAUZE/BANDAGES/DRESSINGS) ×1
WATER STERILE IRR 250ML POUR (IV SOLUTION) ×1 IMPLANT

## 2018-03-29 NOTE — Anesthesia Postprocedure Evaluation (Signed)
Anesthesia Post Note  Patient: [name]  Procedure(s) Performed: CATARACT EXTRACTION PHACO AND INTRAOCULAR LENS PLACEMENT ([place]) (Left Eye)  Patient location during evaluation: Short Stay Anesthesia Type: MAC Level of consciousness: awake and alert Pain management: pain level controlled Vital Signs Assessment: post-procedure vital signs reviewed and stable Respiratory status: spontaneous breathing Cardiovascular status: stable Postop Assessment: no apparent nausea or vomiting Anesthetic complications: no     Last Vitals:  Vitals:   03/29/18 0740 03/29/18 0815  BP: (!) 143/66 (!) 134/56  Pulse:  (!) 56  Resp: 15 16  Temp:  36.7 C  SpO2: 95% 95%    Last Pain:  Vitals:   03/29/18 0815  TempSrc: Oral  PainSc: 0-No pain                 Rider Ermis

## 2018-03-29 NOTE — H&P (Signed)
I have reviewed the H&P, the patient was re-examined, and I have identified no interval changes in medical condition and plan of care since the history and physical of record  

## 2018-03-29 NOTE — Discharge Instructions (Signed)

## 2018-03-29 NOTE — Transfer of Care (Signed)
Immediate Anesthesia Transfer of Care Note  Patient: [name]  Procedure(s) Performed: CATARACT EXTRACTION PHACO AND INTRAOCULAR LENS PLACEMENT ([facility]) (Left Eye)  Patient Location: Short Stay  Anesthesia Type:MAC  Level of Consciousness: awake, alert  and patient cooperative  Airway & Oxygen Therapy: Patient Spontanous Breathing  Post-op Assessment: Report given to RN and Post -op Vital signs reviewed and stable  Post vital signs: Reviewed and stable  Last Vitals:  Vitals Value Taken Time  BP    Temp    Pulse    Resp    SpO2      Last Pain:  Vitals:   03/29/18 0723  TempSrc: Oral  PainSc: 0-No pain      Patients Stated Pain Goal: 5 (58/09/98 [id number])  Complications: No apparent anesthesia complications

## 2018-03-29 NOTE — Anesthesia Preprocedure Evaluation (Signed)
Anesthesia Evaluation  Patient identified by MRN, date of birth, ID band Patient awake    Reviewed: Allergy & Precautions, H&P , NPO status , Patient's Chart, lab work & pertinent test results, reviewed documented beta blocker date and time   Airway Mallampati: II  TM Distance: >3 FB Neck ROM: full    Dental no notable dental hx.    Pulmonary neg pulmonary ROS,    Pulmonary exam normal breath sounds clear to auscultation       Cardiovascular Exercise Tolerance: Good hypertension, On Medications + CAD, + Past MI and +CHF  negative cardio ROS  + dysrhythmias Atrial Fibrillation  Rhythm:regular Rate:Normal     Neuro/Psych negative neurological ROS  negative psych ROS   GI/Hepatic negative GI ROS, Neg liver ROS,   Endo/Other  negative endocrine ROS  Renal/GU Renal diseasenegative Renal ROS  negative genitourinary   Musculoskeletal   Abdominal   Peds  Hematology negative hematology ROS (+)   Anesthesia Other Findings Cardiomyopathy- ischemic Afib on chronic anicoag CABG 2006  Reproductive/Obstetrics negative OB ROS                             Anesthesia Physical Anesthesia Plan  ASA: III  Anesthesia Plan: MAC   Post-op Pain Management:    Induction:   PONV Risk Score and Plan:   Airway Management Planned:   Additional Equipment:   Intra-op Plan:   Post-operative Plan:   Informed Consent: I have reviewed the patients History and Physical, chart, labs and discussed the procedure including the risks, benefits and alternatives for the proposed anesthesia with the patient or authorized representative who has indicated his/her understanding and acceptance.   Dental Advisory Given  Plan Discussed with: CRNA and Anesthesiologist  Anesthesia Plan Comments:         Anesthesia Quick Evaluation

## 2018-03-29 NOTE — Anesthesia Procedure Notes (Signed)
Procedure Name: MAC Date/Time: 03/29/2018 7:47 AM Performed by: Vista Deck, CRNA Pre-anesthesia Checklist: Patient identified, Emergency Drugs available, Suction available, Timeout performed and Patient being monitored Patient Re-evaluated:Patient Re-evaluated prior to induction Oxygen Delivery Method: Nasal Cannula

## 2018-03-29 NOTE — Op Note (Signed)
Date of Admission: 03/29/2018  Date of Surgery: 03/29/2018  Pre-Op Dx: Cataract Left  Eye  Post-Op Dx: Senile Combined Cataract  Left  Eye,  Dx Code S17.793  Surgeon: Tonny Branch, M.D.  Assistants: None  Anesthesia: Topical with MAC  Indications: Painless, progressive loss of vision with compromise of daily activities.  Surgery: Cataract Extraction with Intraocular lens Implant Left Eye  Discription: The patient had dilating drops and viscous lidocaine placed into the Left eye in the pre-op holding area. After transfer to the operating room, a time out was performed. The patient was then prepped and draped. Beginning with a 52m blade a paracentesis port was made at the surgeon's 2 o'clock position. The anterior chamber was then filled with 1% non-preserved lidocaine. This was followed by filling the anterior chamber with Provisc.  A 2.458mkeratome blade was used to make a clear corneal incision at the temporal limbus.  A bent cystatome needle was used to create a continuous tear capsulotomy. Hydrodissection was performed with balanced salt solution on a Fine canula. The lens nucleus was then removed using the phacoemulsification handpiece. Residual cortex was removed with the I&A handpiece. The anterior chamber and capsular bag were refilled with Provisc. A posterior chamber intraocular lens was placed into the capsular bag with it's injector. The implant was positioned with the Kuglan hook. The Provisc was then removed from the anterior chamber and capsular bag with the I&A handpiece. Stromal hydration of the main incision and paracentesis port was performed with BSS on a Fine canula. The wounds were tested for leak which was negative. The patient tolerated the procedure well. There were no operative complications. The patient was then transferred to the recovery room in stable condition.  Complications: None  Specimen: None  EBL: None  Prosthetic device: J&J Technis, PCB00, power 14.5, SN  [id number]

## 2018-03-30 ENCOUNTER — Encounter (HOSPITAL_COMMUNITY): Payer: Self-pay | Admitting: Ophthalmology

## 2018-04-14 ENCOUNTER — Other Ambulatory Visit: Payer: Self-pay | Admitting: Cardiology

## 2018-04-30 ENCOUNTER — Other Ambulatory Visit: Payer: Self-pay | Admitting: Cardiology

## 2018-05-03 NOTE — Progress Notes (Signed)
Cardiology Office Note    Date:  05/04/2018   ID:  [name], DOB 1930-08-23, MRN [id number]  PCP:  Kathyrn Drown, MD  Cardiologist: Jimmy Dolly, MD    Chief Complaint  Patient presents with  . Follow-up    6 month visit    History of Present Illness:    [name] is a 84 y.o. male with past medical history of CAD (s/p CABG in 2006), ischemic cardiomyopathy (EF 45-50% by echo in 2017), paroxysmal atrial fibrillation (on Coumadin), HTN, and HLD who presents to the office today for 2-monthfollow-up.   He was last examined by Dr. BHarl Sanders 09/2017 and denied any recent chest pain or dyspnea on exertion. Was continued on his current medication regimen and informed to follow-up in 6 months.    In talking with the patient today, he initially becomes tearful as he reports his wife of 653years passed away in J06/27/24due to complications of a brain tumor.  He has been trying to stay active since and says he is helping with the [place] an effort to keep himself "busy".  He denies any recent chest pain, dyspnea on exertion, orthopnea, PND, lower extremity edema, palpitations, or presyncope. He does not check his blood pressure regularly at home and it is slightly elevated at 142/60 on initial check today, improved to 132/62 on recheck.  He remains on Coumadin for anticoagulation and denies any recent melena, hematochezia, or hematuria.   Past Medical History:  Diagnosis Date  . Anticoagulant disorder ([place]   . ASCVD (arteriosclerotic cardiovascular disease)    CABG 11/06; MUGA 11/08 - 44%; 3/07 mildly decreased EF on echo  . Atrial fibrillation ([place]    Initially occurred following CABG; event recorder in 11/08 - no AF; recurred 08/2009  . CHF (congestive heart failure) ([name]   . Coronary artery disease   . History of kidney stones   . HOH (hard of hearing)   . Hyperlipidemia   . Hypertension   . Myocardial infarction ([place]   . Nephrolithiasis     . Overweight(278.02)     Past Surgical History:  Procedure Laterality Date  . ANGIOPLASTY  1991  . CATARACT EXTRACTION W/PHACO Right 02/22/2018   Procedure: CATARACT EXTRACTION PHACO AND INTRAOCULAR LENS PLACEMENT RIGHT EYE CDE=16.62;  Surgeon: HTonny Branch MD;  Location: [facility];  Service: Ophthalmology;  Laterality: Right;  right  . CATARACT EXTRACTION W/PHACO Left 03/29/2018   Procedure: CATARACT EXTRACTION PHACO AND INTRAOCULAR LENS PLACEMENT (IOC);  Surgeon: HTonny Branch MD;  Location: [facility];  Service: Ophthalmology;  Laterality: Left;  CDE: 16.37  . COLONOSCOPY  2008  . CORONARY ARTERY BYPASS GRAFT  04/2005    Current Medications: Outpatient Medications Prior to Visit  Medication Sig Dispense Refill  . warfarin (COUMADIN) 5 MG tablet TAKE 1 TABLET ON MONDAYS AND FRIDAYS, THEN TAKE 1/2 TABLET EVERY OTHER DAY. (Patient taking differently: Take 2.5-5 mg by mouth See admin instructions. Take 5 mg by mouth daily with supper on Monday and Friday. Take 2.5 mg by mouth daily with supper on all other days.) 30 tablet 3  . amLODipine-benazepril (LOTREL) 10-40 MG capsule TAKE 1 CAPSULE BY MOUTH ONCE DAILY. (Patient taking differently: Take 1 capsule by mouth daily. ) 30 capsule 11  . atorvastatin (LIPITOR) 80 MG tablet TAKE 1 TABLET ONCE DAILY. 90 tablet 0  . furosemide (LASIX) 20 MG tablet TAKE (1) TABLET BY MOUTH ONCE DAILY. 30 tablet 6  .  metoprolol succinate (TOPROL-XL) 50 MG 24 hr tablet TAKE 1 TABLET ONCE DAILY IMMEDIATELY FOLLOWING A MEAL. (Patient taking differently: Take 50 mg by mouth daily with supper. IMMEDIATELY FOLLOWING A MEAL.) 90 tablet 3   No facility-administered medications prior to visit.      Allergies:   Ibuprofen   Social History   Socioeconomic History  . Marital status: Widowed    Spouse name: Not on file  . Number of children: Not on file  . Years of education: Not on file  . Highest education level: Not on file  Occupational History  . 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 Financial resource strain: Not on file  . Food insecurity:    Worry: Not on file    Inability: Not on file  . Transportation needs:    Medical: Not on file    Non-medical: Not on file  Tobacco Use  . Smoking status: Never Smoker  . Smokeless tobacco: Never Used  Substance and Sexual Activity  . Alcohol use: No  . Drug use: No  . Sexual activity: Not Currently  Lifestyle  . Physical activity:    Days per week: Not on file    Minutes per session: Not on file  . Stress: Not on file  Relationships  . Social connections:    Talks on phone: Not on file    Gets together: Not on file    Attends religious service: Not on file    Active member of club or organization: Not on file    Attends meetings of clubs or organizations: Not on file    Relationship status: Not on file  Other Topics Concern  . Not on file  Social History Narrative   Married, lives in [place]     Family History:  The patient's family history includes Diabetes in his brother; Heart attack in his father; Hypertension in his father.   Review of Systems:   Please see the history of present illness.     General:  No chills, fever, night sweats or weight changes.  Cardiovascular:  No chest pain, dyspnea on exertion, orthopnea, palpitations, paroxysmal nocturnal dyspnea. Positive for occasional lower extremity edema.  Dermatological: No rash, lesions/masses Respiratory: No cough, dyspnea Urologic: No hematuria, dysuria Abdominal:   No nausea, vomiting, diarrhea, bright red blood per rectum, melena, or hematemesis Neurologic:  No visual changes, wkns, changes in mental status. All other systems reviewed and are otherwise negative except as noted above.   Physical Exam:    VS:  BP 132/62   Pulse 61   Ht _0  (1.753 m)   Wt 242 lb 4.8 oz (109.9 kg)   SpO2 97%   BMI  35.78 kg/m    General: Well developed, elderly Caucasian,male appearing in no acute distress. Head: Normocephalic, atraumatic, sclera non-icteric, no xanthomas, nares are without discharge.  Neck: No carotid bruits. JVD not elevated.  Lungs: Respirations regular and unlabored, without wheezes or rales.  Heart: Irregularly irregular, No S3 or S4.  No murmur, no rubs, or gallops appreciated. Abdomen: Soft, non-tender, non-distended with normoactive bowel sounds. No hepatomegaly. No rebound/guarding. No obvious abdominal masses. Msk:  Strength and tone appear normal for age. No joint deformities or effusions. Extremities: No clubbing or cyanosis. Trace lower extremity edema.  Distal pedal pulses are 2+ bilaterally. Neuro: Alert and oriented X 3. Moves all extremities  spontaneously. No focal deficits noted. Psych:  Responds to questions appropriately with a normal affect. Skin: No rashes or lesions noted  Wt Readings from Last 3 Encounters:  05/04/18 242 lb 4.8 oz (109.9 kg)  03/29/18 233 lb 14.5 oz (106.1 kg)  02/01/18 234 lb (106.1 kg)     Studies/Labs Reviewed:   EKG:  EKG is ordered today. The ekg ordered today demonstrates atrial fibrillation with slow ventricular response, HR 51, with LAFB.  No acute ST changes when compared to prior tracings.  Recent Labs: 09/28/2017: ALT 10 02/01/2018: BUN 19; Creatinine, Ser 0.87; Hemoglobin 14.7; Platelets 212; Potassium 3.8; Sodium 137   Lipid Panel    Component Value Date/Time   CHOL 139 09/28/2017 1529   TRIG 138 09/28/2017 1529   HDL 50 09/28/2017 1529   CHOLHDL 2.8 09/28/2017 1529   CHOLHDL 2.6 03/20/2014 0837   VLDL 16 03/20/2014 0837   LDLCALC 61 09/28/2017 1529    Additional studies/ records that were reviewed today include:   Echocardiogram: 01/2016 Study Conclusions  - Left ventricle: The cavity size was normal. Wall thickness was   increased in a pattern of severe LVH. Systolic function was   mildly reduced. The estimated  ejection fraction was in the range   of 45% to 50%. There is hypokinesis of the mid-apicalanteroseptal   and apical myocardium. - Aortic valve: Trileaflet; moderately calcified leaflets. There   was trivial regurgitation. - Aortic root: The aortic root was mildly dilated. - Mitral valve: Calcified annulus. There was trivial regurgitation. - Left atrium: The atrium was moderately dilated. - Right atrium: The atrium was mildly dilated. Central venous   pressure (est): 8 mm Hg. - Tricuspid valve: There was trivial regurgitation. - Pulmonary arteries: Systolic pressure could not be accurately   estimated. - Pericardium, extracardiac: There was no pericardial effusion.  Impressions:  - Severe LVH with LVEF approximately 45-50%. There is hypokinesis   of the mid to apical anteroseptal wall and apex, no obvious LV   mural thrombus with Definity contrast. Indeterminate diastolic   function in the setting of atrial fibrillation. Moderate left   atrial enlargement. MAC with trivial mitral regurgitation. Mildly   dilated aortic root with sclerotic aortic valve and trivial   aortic stenosis. Mildly dilated right atrium. Trivial tricuspid   regurgitation.   Assessment:    1. Coronary artery disease involving native coronary artery of native heart without angina pectoris   2. Ischemic cardiomyopathy   3. Paroxysmal atrial fibrillation (HCC)   4. Essential hypertension   5. Hyperlipidemia LDL goal <70      Plan:   In order of problems listed above:  1. CAD - he is s/p CABG in 2006. Most recent echo in 01/2016 showed a stable EF of 45-50% as outlined above. He denies any recent chest pain or dyspnea on exertion.  - EKG today shows known atrial fibrillation with no acute ST changes. No indication for further ischemic evaluation at this time. - Continue beta-blocker and statin therapy. Not on ASA given the need for anticoagulation.  2. Ischemic Cardiomyopathy - EF previously 40% in  2006, improved slightly to 45 to 50% by repeat echocardiogram in 2017. He denies any recent dyspnea on exertion, orthopnea, PND, or lower extremity edema.  He does have trace edema on examination today. - Continue Benazepril, Toprol-XL, and Lasix at current dosing.   3. Paroxysmal Atrial Fibrillation - He denies any recent palpitations and heart rate is well controlled in the 50's to 60's  during today's visit. No recent lightheadedness, dizziness, or presyncope. Continue Toprol-XL 50 mg daily for rate control. - Denies any evidence of active bleeding. Remains on Coumadin for anticoagulation.  4. HTN - BP slightly elevated to 142/60 on initial check, improved to 132/62 on recheck. - continue Amlodipine-Benazepril 10-50m daily and Toprol-XL 525mdaily. BMET in 01/2018 showed stable kidney function and electrolytes.   5. HLD - Followed by PCP. He remains on Atorvastatin 80 mg daily with goal LDL less than 70 in the setting of known CAD.   Medication Adjustments/Labs and Tests Ordered: Current medicines are reviewed at length with the patient today.  Concerns regarding medicines are outlined above.  Medication changes, Labs and Tests ordered today are listed in the Patient Instructions below. Patient Instructions  Medication Instructions:  Your physician recommends that you continue on your current medications as directed. Please refer to the Current Medication list given to you today.  If you need a refill on your cardiac medications before your next appointment, please call your pharmacy.   Lab work: NONE  If you have labs (blood work) drawn today and your tests are completely normal, you will receive your results only by: . Marland KitchenyChart Message (if you have MyChart) OR . A paper copy in the mail If you have any lab test that is abnormal or we need to change your treatment, we will call you to review the results.  Testing/Procedures: NONE   Follow-Up: [facility]  [facility] are our priority.  As part of our continuing mission to provide you with exceptional heart care, we have created designated Provider Care Teams.  These Care Teams include your primary Cardiologist (physician) and Advanced Practice Providers (APPs -  Physician Assistants and Nurse Practitioners) who all work together to provide you with the care you need, when you need it. You will need a follow up appointment in 6 months.  Please call our office 2 months in advance to schedule this appointment.  You may see BrCarlyle DollyMD or one of the following Advanced Practice Providers on your designated Care Team:   BrBernerd PhoPA-C ([facility]. MiErmalinda BarriosPA-C ([place] Any Other Special Instructions Will Be Listed Below (If Applicable). Thank you for choosing [facility]    Signed, BrErma HeritagePA-C  05/04/2018 1:39 PM    [facility] [address]eBowdensNC [id number]: [phone]

## 2018-05-04 ENCOUNTER — Ambulatory Visit (INDEPENDENT_AMBULATORY_CARE_PROVIDER_SITE_OTHER): Payer: Medicare Other | Admitting: *Deleted

## 2018-05-04 ENCOUNTER — Encounter: Payer: Self-pay | Admitting: Student

## 2018-05-04 ENCOUNTER — Ambulatory Visit: Payer: Medicare Other | Admitting: Student

## 2018-05-04 VITALS — BP 132/62 | HR 61 | Ht 69.0 in | Wt 242.3 lb

## 2018-05-04 DIAGNOSIS — I480 Paroxysmal atrial fibrillation: Secondary | ICD-10-CM | POA: Diagnosis not present

## 2018-05-04 DIAGNOSIS — Z5181 Encounter for therapeutic drug level monitoring: Secondary | ICD-10-CM

## 2018-05-04 DIAGNOSIS — I4891 Unspecified atrial fibrillation: Secondary | ICD-10-CM

## 2018-05-04 DIAGNOSIS — I2510 Atherosclerotic heart disease of native coronary artery without angina pectoris: Secondary | ICD-10-CM

## 2018-05-04 DIAGNOSIS — E785 Hyperlipidemia, unspecified: Secondary | ICD-10-CM

## 2018-05-04 DIAGNOSIS — I4821 Permanent atrial fibrillation: Secondary | ICD-10-CM

## 2018-05-04 DIAGNOSIS — I10 Essential (primary) hypertension: Secondary | ICD-10-CM | POA: Diagnosis not present

## 2018-05-04 DIAGNOSIS — I255 Ischemic cardiomyopathy: Secondary | ICD-10-CM

## 2018-05-04 LAB — POCT INR: INR: 2.2 (ref 2.0–3.0)

## 2018-05-04 MED ORDER — METOPROLOL SUCCINATE ER 50 MG PO TB24: 50.0000 mg | ORAL_TABLET | Freq: Every day | ORAL | 3 refills | Status: DC

## 2018-05-04 MED ORDER — FUROSEMIDE 20 MG PO TABS: 20.0000 mg | ORAL_TABLET | Freq: Every day | ORAL | 3 refills | Status: DC

## 2018-05-04 MED ORDER — ATORVASTATIN CALCIUM 80 MG PO TABS: 80.0000 mg | ORAL_TABLET | Freq: Every day | ORAL | 3 refills | Status: DC

## 2018-05-04 MED ORDER — AMLODIPINE BESY-BENAZEPRIL HCL 10-40 MG PO CAPS: 1.0000 | ORAL_CAPSULE | Freq: Every day | ORAL | 3 refills | Status: DC

## 2018-05-04 NOTE — Patient Instructions (Signed)
Medication Instructions:  Your physician recommends that you continue on your current medications as directed. Please refer to the Current Medication list given to you today.  If you need a refill on your cardiac medications before your next appointment, please call your pharmacy.   Lab work: NONE   If you have labs (blood work) drawn today and your tests are completely normal, you will receive your results only by: . MyChart Message (if you have MyChart) OR . A paper copy in the mail If you have any lab test that is abnormal or we need to change your treatment, we will call you to review the results.  Testing/Procedures: NONE   Follow-Up: At [facility], you and your health needs are our priority.  As part of our continuing mission to provide you with exceptional heart care, we have created designated Provider Care Teams.  These Care Teams include your primary Cardiologist (physician) and Advanced Practice Providers (APPs -  Physician Assistants and Nurse Practitioners) who all work together to provide you with the care you need, when you need it. You will need a follow up appointment in 6 months.  Please call our office 2 months in advance to schedule this appointment.  You may see Branch, Jonathan, MD or one of the following Advanced Practice Providers on your designated Care Team:   Brittany Strader, PA-C ([place] Office) . Michele Lenze, PA-C ( Office)  Any Other Special Instructions Will Be Listed Below (If Applicable). Thank you for choosing  [facility]Care!     

## 2018-05-04 NOTE — Patient Instructions (Signed)
Continue coumadin 1/2 tablet daily except 1 tablet on Mondays and Fridays.  Recheck in 6 weeks.  

## 2018-05-17 ENCOUNTER — Other Ambulatory Visit: Payer: Self-pay | Admitting: Cardiology

## 2018-06-16 ENCOUNTER — Ambulatory Visit (INDEPENDENT_AMBULATORY_CARE_PROVIDER_SITE_OTHER): Payer: Medicare Other | Admitting: *Deleted

## 2018-06-16 DIAGNOSIS — I4891 Unspecified atrial fibrillation: Secondary | ICD-10-CM

## 2018-06-16 DIAGNOSIS — Z5181 Encounter for therapeutic drug level monitoring: Secondary | ICD-10-CM

## 2018-06-16 LAB — POCT INR: INR: 2 (ref 2.0–3.0)

## 2018-06-16 NOTE — Patient Instructions (Signed)
Take 1 tablet tonight then resume 1/2 tablet daily except 1 tablet on Mondays and Fridays.  Recheck in 6 weeks.

## 2018-07-28 ENCOUNTER — Ambulatory Visit (INDEPENDENT_AMBULATORY_CARE_PROVIDER_SITE_OTHER): Payer: Medicare Other | Admitting: Pharmacist

## 2018-07-28 DIAGNOSIS — Z5181 Encounter for therapeutic drug level monitoring: Secondary | ICD-10-CM | POA: Diagnosis not present

## 2018-07-28 DIAGNOSIS — I4891 Unspecified atrial fibrillation: Secondary | ICD-10-CM | POA: Diagnosis not present

## 2018-07-28 LAB — POCT INR: INR: 2.4 (ref 2.0–3.0)

## 2018-07-28 NOTE — Patient Instructions (Signed)
Description   Continue 1/2 tablet daily except 1 tablet on Mondays and Fridays Recheck in 6 weeks      

## 2018-09-06 ENCOUNTER — Other Ambulatory Visit: Payer: Self-pay | Admitting: Cardiology

## 2018-09-08 ENCOUNTER — Ambulatory Visit (INDEPENDENT_AMBULATORY_CARE_PROVIDER_SITE_OTHER): Payer: Medicare Other | Admitting: *Deleted

## 2018-09-08 ENCOUNTER — Other Ambulatory Visit: Payer: Self-pay

## 2018-09-08 DIAGNOSIS — I4891 Unspecified atrial fibrillation: Secondary | ICD-10-CM | POA: Diagnosis not present

## 2018-09-08 DIAGNOSIS — Z5181 Encounter for therapeutic drug level monitoring: Secondary | ICD-10-CM | POA: Diagnosis not present

## 2018-09-08 LAB — POCT INR: INR: 2.5 (ref 2.0–3.0)

## 2018-09-08 NOTE — Patient Instructions (Signed)
Continue 1/2 tablet daily except 1 tablet on Mondays and Fridays.  Recheck in 6 weeks. 

## 2018-10-13 ENCOUNTER — Other Ambulatory Visit: Payer: Self-pay | Admitting: Cardiology

## 2018-10-19 ENCOUNTER — Other Ambulatory Visit: Payer: Self-pay | Admitting: Cardiology

## 2018-10-19 ENCOUNTER — Telehealth: Payer: Self-pay | Admitting: *Deleted

## 2018-10-19 NOTE — Telephone Encounter (Signed)

## 2018-10-20 ENCOUNTER — Telehealth (INDEPENDENT_AMBULATORY_CARE_PROVIDER_SITE_OTHER): Payer: Medicare Other | Admitting: Cardiology

## 2018-10-20 ENCOUNTER — Ambulatory Visit: Payer: Medicare Other | Admitting: Cardiology

## 2018-10-20 ENCOUNTER — Ambulatory Visit (INDEPENDENT_AMBULATORY_CARE_PROVIDER_SITE_OTHER): Payer: Medicare Other | Admitting: *Deleted

## 2018-10-20 ENCOUNTER — Encounter: Payer: Self-pay | Admitting: Cardiology

## 2018-10-20 VITALS — Ht 70.0 in | Wt 240.0 lb

## 2018-10-20 DIAGNOSIS — E782 Mixed hyperlipidemia: Secondary | ICD-10-CM

## 2018-10-20 DIAGNOSIS — I4891 Unspecified atrial fibrillation: Secondary | ICD-10-CM

## 2018-10-20 DIAGNOSIS — Z5181 Encounter for therapeutic drug level monitoring: Secondary | ICD-10-CM | POA: Diagnosis not present

## 2018-10-20 DIAGNOSIS — I2510 Atherosclerotic heart disease of native coronary artery without angina pectoris: Secondary | ICD-10-CM | POA: Diagnosis not present

## 2018-10-20 DIAGNOSIS — I10 Essential (primary) hypertension: Secondary | ICD-10-CM

## 2018-10-20 DIAGNOSIS — I255 Ischemic cardiomyopathy: Secondary | ICD-10-CM

## 2018-10-20 LAB — POCT INR: INR: 2.6 (ref 2.0–3.0)

## 2018-10-20 NOTE — Progress Notes (Signed)
Virtual Visit via Telephone Note   This visit type was conducted due to national recommendations for restrictions regarding the COVID-19 Pandemic (e.g. social distancing) in an effort to limit this patient's exposure and mitigate transmission in our community.  Due to his co-morbid illnesses, this patient is at least at moderate risk for complications without adequate follow up.  This format is felt to be most appropriate for this patient at this time.  The patient did not have access to video technology/had technical difficulties with video requiring transitioning to audio format only (telephone).  All issues noted in this document were discussed and addressed.  No physical exam could be performed with this format.  Please refer to the patient's chart for his  consent to telehealth for [facility].   Evaluation Performed:  Follow-up visit  Date:  10/20/2018   ID:  [name], DOB Jun 28, 1931, MRN [id number]  Patient Location: Home Provider Location: Home  PCP:  Kathyrn Drown, MD  Cardiologist:  Carlyle Dolly, MD  Electrophysiologist:  None   Chief Complaint:  6 month f/u  History of Present Illness:    [name] is a 85 y.o. male seen today for follow up of the following medical problems.   1. CAD/ICM  - prior CABG in Nov 2006 at [facility] - echo 01/2016 LVEF 45-50%, severe LVH, apical hypokinesis, indeterminate diastolic function due to afib.    - no recent chest pain. No SOb/DOE. No LE edema - compliant with meds   2. Afib  - Has not been interested in NOACs  - denies any palpitaitons - no bleeding on coumadin  3. HTN  -he is compliant with meds  4. HL  09/2017 TC 139 TG 138 HDL 50 LDL 61 -compliant with statin  SH: wife passed late last year.    The patient does not have symptoms concerning for COVID-19 infection (fever, chills, cough, or new shortness of breath).    Past Medical History:  Diagnosis Date  . Anticoagulant disorder  ([place])   . ASCVD (arteriosclerotic cardiovascular disease)    CABG 11/06; MUGA 11/08 - 44%; 3/07 mildly decreased EF on echo  . Atrial fibrillation ([place])    Initially occurred following CABG; event recorder in 11/08 - no AF; recurred 08/2009  . CHF (congestive heart failure) ([place])   . Coronary artery disease   . History of kidney stones   . HOH (hard of hearing)   . Hyperlipidemia   . Hypertension   . Myocardial infarction ([place])   . Nephrolithiasis   . Overweight(278.02)    Past Surgical History:  Procedure Laterality Date  . ANGIOPLASTY  1991  . CATARACT EXTRACTION W/PHACO Right 02/22/2018   Procedure: CATARACT EXTRACTION PHACO AND INTRAOCULAR LENS PLACEMENT RIGHT EYE CDE=16.62;  Surgeon: Tonny Shyquan Stallbaumer, MD;  Location: [facility];  Service: Ophthalmology;  Laterality: Right;  right  . CATARACT EXTRACTION W/PHACO Left 03/29/2018   Procedure: CATARACT EXTRACTION PHACO AND INTRAOCULAR LENS PLACEMENT (IOC);  Surgeon: Tonny Krisanne Lich, MD;  Location: [facility];  Service: Ophthalmology;  Laterality: Left;  CDE: 16.37  . COLONOSCOPY  2008  . CORONARY ARTERY BYPASS GRAFT  04/2005     Current Meds  Medication Sig  . amLODipine-benazepril (LOTREL) 10-40 MG capsule Take 1 capsule by mouth daily.  . furosemide (LASIX) 20 MG tablet Take 1 tablet (20 mg total) by mouth daily.  . metoprolol succinate (TOPROL-XL) 50 MG 24 hr tablet Take 1 tablet (50 mg total) by mouth daily with  supper. IMMEDIATELY FOLLOWING A MEAL.  [name] warfarin (COUMADIN) 5 MG tablet TAKE 1 TABLET BY MOUTH DAILY WITH SUPPER ON MONDAY AND FRIDAY. TAKE 1/2 TABLET BY MOUTH DAILY WITH SUPPER ON ALL OTHER DAYS.  . [DISCONTINUED] atorvastatin (LIPITOR) 80 MG tablet Take 1 tablet (80 mg total) by mouth daily.     Allergies:   Ibuprofen   Social History   Tobacco Use  . Smoking status: Never Smoker  . Smokeless tobacco: Never Used  Substance Use Topics  . Alcohol use: No  . Drug use: No     Family Hx: The patient's family history includes  Diabetes in his brother; Heart attack in his father; Hypertension in his father.  ROS:   Please see the history of present illness.     All other systems reviewed and are negative.   Prior CV studies:   The following studies were reviewed today:  July 2011 Echo: mild to moderately decreased LVEF (no LVEF given), apex akinetic, severe LVH, severe LAE   05/05/13 Clinic EKG: afib at 58 bpm, LAD, anteroseptal and anterolateral Q waves, inferior Q waves   05/2013 Echo Technically difficult study, severe LVH, elevated LA pressure. Difficult to evalute LV function even with contrast, appears low normal to normal.   01/2016 echo Study Conclusions  - Left ventricle: The cavity size was normal. Wall thickness was increased in a pattern of severe LVH. Systolic function was mildly reduced. The estimated ejection fraction was in the range of 45% to 50%. There is hypokinesis of the mid-apicalanteroseptal and apical myocardium. - Aortic valve: Trileaflet; moderately calcified leaflets. There was trivial regurgitation. - Aortic root: The aortic root was mildly dilated. - Mitral valve: Calcified annulus. There was trivial regurgitation. - Left atrium: The atrium was moderately dilated. - Right atrium: The atrium was mildly dilated. Central venous pressure (est): 8 mm Hg. - Tricuspid valve: There was trivial regurgitation. - Pulmonary arteries: Systolic pressure could not be accurately estimated. - Pericardium, extracardiac: There was no pericardial effusion.  Impressions:  - Severe LVH with LVEF approximately 45-50%. There is hypokinesis of the mid to apical anteroseptal wall and apex, no obvious LV mural thrombus with Definity contrast. Indeterminate diastolic function in the setting of atrial fibrillation. Moderate left atrial enlargement. MAC with trivial mitral regurgitation. Mildly dilated aortic root with sclerotic aortic valve and trivial aortic  stenosis. Mildly dilated right atrium. Trivial tricuspid regurgitation.   Labs/Other Tests and Data Reviewed:    EKG:  na  Recent Labs: 02/01/2018: BUN 19; Creatinine, Ser 0.87; Hemoglobin 14.7; Platelets 212; Potassium 3.8; Sodium 137   Recent Lipid Panel Lab Results  Component Value Date/Time   CHOL 139 09/28/2017 03:29 PM   TRIG 138 09/28/2017 03:29 PM   HDL 50 09/28/2017 03:29 PM   CHOLHDL 2.8 09/28/2017 03:29 PM   CHOLHDL 2.6 03/20/2014 08:37 AM   LDLCALC 61 09/28/2017 03:29 PM    Wt Readings from Last 3 Encounters:  05/04/18 242 lb 4.8 oz (109.9 kg)  03/29/18 233 lb 14.5 oz (106.1 kg)  02/01/18 234 lb (106.1 kg)     Objective:    Vital Signs:  There were no vitals taken for this visit.   Normal affect. Normal speech pattern and tone. Comfortable in no distress. No auditory signs of SOB or wheezing  ASSESSMENT & PLAN:    1. CAD/ICM - no recent symptoms, continue current meds  2. Afib  - no symptoms, continue current meds including anticoag  3. HTN  - continue current  therapy  4. Hyperlipidemia  -labs due later this year, has been at goal, continue current meds  COVID-19 Education: The signs and symptoms of COVID-19 were discussed with the patient and how to seek care for testing (follow up with PCP or arrange E-visit).  The importance of social distancing was discussed today.  Time:   Today, I have spent 20 minutes with the patient with telehealth technology discussing the above problems.     Medication Adjustments/Labs and Tests Ordered: Current medicines are reviewed at length with the patient today.  Concerns regarding medicines are outlined above.   Tests Ordered: No orders of the defined types were placed in this encounter.   Medication Changes: No orders of the defined types were placed in this encounter.   Disposition:  Follow up 6 months  Signed, Carlyle Dolly, MD  10/20/2018 7:56 AM    [place]

## 2018-10-20 NOTE — Patient Instructions (Signed)
Continue 1/2 tablet daily except 1 tablet on Mondays and Fridays.  Recheck in 6 weeks. 

## 2018-10-20 NOTE — Progress Notes (Signed)

## 2018-11-01 ENCOUNTER — Other Ambulatory Visit: Payer: Self-pay | Admitting: Cardiology

## 2018-12-06 ENCOUNTER — Other Ambulatory Visit: Payer: Self-pay | Admitting: Cardiology

## 2018-12-13 ENCOUNTER — Ambulatory Visit (INDEPENDENT_AMBULATORY_CARE_PROVIDER_SITE_OTHER): Payer: Medicare Other | Admitting: *Deleted

## 2018-12-13 DIAGNOSIS — I4891 Unspecified atrial fibrillation: Secondary | ICD-10-CM | POA: Diagnosis not present

## 2018-12-13 DIAGNOSIS — Z5181 Encounter for therapeutic drug level monitoring: Secondary | ICD-10-CM | POA: Diagnosis not present

## 2018-12-13 LAB — POCT INR
INR: 2.3 (ref 2.0–3.0)
INR: 2.3 (ref 2.0–3.0)

## 2018-12-13 NOTE — Patient Instructions (Signed)
Continue 1/2 tablet daily except 1 tablet on Mondays and Fridays.  Recheck in 6 weeks. 

## 2019-01-24 ENCOUNTER — Ambulatory Visit (INDEPENDENT_AMBULATORY_CARE_PROVIDER_SITE_OTHER): Payer: Medicare Other | Admitting: *Deleted

## 2019-01-24 DIAGNOSIS — I4891 Unspecified atrial fibrillation: Secondary | ICD-10-CM

## 2019-01-24 DIAGNOSIS — Z5181 Encounter for therapeutic drug level monitoring: Secondary | ICD-10-CM | POA: Diagnosis not present

## 2019-01-24 LAB — POCT INR: INR: 2.3 (ref 2.0–3.0)

## 2019-01-24 NOTE — Patient Instructions (Signed)
Continue 1/2 tablet daily except 1 tablet on Mondays and Fridays.  Recheck in 6 weeks. 

## 2019-03-01 ENCOUNTER — Telehealth: Payer: Self-pay | Admitting: Pharmacist

## 2019-03-01 NOTE — Telephone Encounter (Signed)
Called pt to discuss changing to [place] therapy due to better safety and efficacy data, especially in the setting of COVID pandemic to help reduce in-office visits. Pt prefers to continue on warfarin since he is doing well.

## 2019-03-09 ENCOUNTER — Ambulatory Visit (INDEPENDENT_AMBULATORY_CARE_PROVIDER_SITE_OTHER): Payer: Medicare Other | Admitting: *Deleted

## 2019-03-09 ENCOUNTER — Other Ambulatory Visit: Payer: Self-pay

## 2019-03-09 DIAGNOSIS — I4891 Unspecified atrial fibrillation: Secondary | ICD-10-CM | POA: Diagnosis not present

## 2019-03-09 DIAGNOSIS — Z5181 Encounter for therapeutic drug level monitoring: Secondary | ICD-10-CM | POA: Diagnosis not present

## 2019-03-09 LAB — POCT INR: INR: 1.9 — AB (ref 2.0–3.0)

## 2019-03-09 NOTE — Patient Instructions (Signed)
Take coumadin 1 tablet tonight then resume 1/2 tablet daily except 1 tablet on Mondays and Fridays.  Recheck in 6 weeks.

## 2019-04-20 ENCOUNTER — Other Ambulatory Visit: Payer: Self-pay

## 2019-04-20 ENCOUNTER — Ambulatory Visit (INDEPENDENT_AMBULATORY_CARE_PROVIDER_SITE_OTHER): Payer: Medicare Other | Admitting: *Deleted

## 2019-04-20 DIAGNOSIS — Z5181 Encounter for therapeutic drug level monitoring: Secondary | ICD-10-CM

## 2019-04-20 DIAGNOSIS — I4891 Unspecified atrial fibrillation: Secondary | ICD-10-CM

## 2019-04-20 LAB — POCT INR: INR: 1.9 — AB (ref 2.0–3.0)

## 2019-04-20 NOTE — Patient Instructions (Signed)
Take coumadin 1 tablet tonight then resume 1/2 tablet daily except 1 tablet on Mondays and Fridays. Missed 2 doses of warfarin.  Pharmacy did not deliver  Recheck in 6 weeks.

## 2019-04-28 ENCOUNTER — Encounter (HOSPITAL_COMMUNITY): Payer: Self-pay | Admitting: *Deleted

## 2019-04-28 ENCOUNTER — Emergency Department (HOSPITAL_COMMUNITY)
Admission: EM | Admit: 2019-04-28 | Discharge: 2019-04-28 | Disposition: A | Discharge status: Home / Self Care | Payer: Medicare Other | Attending: Emergency Medicine | Admitting: Emergency Medicine

## 2019-04-28 ENCOUNTER — Other Ambulatory Visit: Payer: Self-pay

## 2019-04-28 ENCOUNTER — Emergency Department (HOSPITAL_COMMUNITY): Payer: Medicare Other

## 2019-04-28 DIAGNOSIS — I509 Heart failure, unspecified: Secondary | ICD-10-CM | POA: Diagnosis not present

## 2019-04-28 DIAGNOSIS — Y92009 Unspecified place in unspecified non-institutional (private) residence as the place of occurrence of the external cause: Secondary | ICD-10-CM | POA: Diagnosis not present

## 2019-04-28 DIAGNOSIS — Y939 Activity, unspecified: Secondary | ICD-10-CM | POA: Diagnosis not present

## 2019-04-28 DIAGNOSIS — I110 Hypertensive heart disease with heart failure: Secondary | ICD-10-CM | POA: Diagnosis not present

## 2019-04-28 DIAGNOSIS — I2510 Atherosclerotic heart disease of native coronary artery without angina pectoris: Secondary | ICD-10-CM | POA: Insufficient documentation

## 2019-04-28 DIAGNOSIS — Z7901 Long term (current) use of anticoagulants: Secondary | ICD-10-CM | POA: Insufficient documentation

## 2019-04-28 DIAGNOSIS — Z951 Presence of aortocoronary bypass graft: Secondary | ICD-10-CM | POA: Diagnosis not present

## 2019-04-28 DIAGNOSIS — Y999 Unspecified external cause status: Secondary | ICD-10-CM | POA: Insufficient documentation

## 2019-04-28 DIAGNOSIS — W010XXA Fall on same level from slipping, tripping and stumbling without subsequent striking against object, initial encounter: Secondary | ICD-10-CM | POA: Insufficient documentation

## 2019-04-28 DIAGNOSIS — S46911A Strain of unspecified muscle, fascia and tendon at shoulder and upper arm level, right arm, initial encounter: Secondary | ICD-10-CM | POA: Insufficient documentation

## 2019-04-28 DIAGNOSIS — I252 Old myocardial infarction: Secondary | ICD-10-CM | POA: Diagnosis not present

## 2019-04-28 DIAGNOSIS — Z79899 Other long term (current) drug therapy: Secondary | ICD-10-CM | POA: Diagnosis not present

## 2019-04-28 DIAGNOSIS — S4991XA Unspecified injury of right shoulder and upper arm, initial encounter: Secondary | ICD-10-CM | POA: Diagnosis present

## 2019-04-28 MED ORDER — ACETAMINOPHEN 325 MG PO TABS
650.0000 mg | ORAL_TABLET | Freq: Once | ORAL | Status: AC
  Administered 2019-04-28: 15:00:00 650 mg via ORAL
  Filled 2019-04-28: qty 2

## 2019-04-28 NOTE — ED Triage Notes (Signed)
Fell at home, pain in right shoulder, unable to lift arm

## 2019-04-28 NOTE — Discharge Instructions (Addendum)
Wear sling, remove arm from sling periodically by gentle range of motion exercises. Alternate warm and cold compresses for 20 minutes at a time each. Take Tylenol as needed as directed for pain. Follow-up with orthopedics, call to schedule an appointment.

## 2019-04-28 NOTE — ED Provider Notes (Signed)
[facility] EMERGENCY DEPARTMENT Provider Note   CSN: [id number] Arrival date & time: 04/28/19  1249     History   Chief Complaint Chief Complaint  Patient presents with   Shoulder Injury    HPI Jimmy LeversJohn H Yokoyama is a 85 y.o. male.     85 year old male presents with complaint of pain in his right shoulder.  Patient states that he slipped at home and fell landing on his back and right shoulder.  Patient denies feeling weak or dizzy prior to the fall, denies hitting his head or loss of consciousness, denies pain in his neck or back.  Patient is ambulatory without difficulty however states he has pain if he tries to move his right arm, pain in the right shoulder.  Patient is on Coumadin for A. fib.  No other injuries, complaints, concerns.     Past Medical History:  Diagnosis Date   Anticoagulant disorder (HCC)    ASCVD (arteriosclerotic cardiovascular disease)    CABG 11/06; MUGA 11/08 - 44%; 3/07 mildly decreased EF on echo   Atrial fibrillation (HCC)    Initially occurred following CABG; event recorder in 11/08 - no AF; recurred 08/2009   CHF (congestive heart failure) (HCC)    Coronary artery disease    History of kidney stones    HOH (hard of hearing)    Hyperlipidemia    Hypertension    Myocardial infarction [facility](HCC)    Nephrolithiasis    Overweight(278.02)     Patient Active Problem List   Diagnosis Date Noted   Cardiomyopathy, ischemic 05/01/2016   Viral gastroenteritis 01/06/2015   Prediabetes 08/25/2014   Encounter for therapeutic drug monitoring 08/01/2013   Atrial fibrillation (HCC) 10/01/2010   Chronic anticoagulation 10/01/2010   Hyperlipidemia 12/18/2009   Overweight(278.02) 12/18/2009   NEPHROLITHIASIS 12/18/2009   Essential hypertension 08/07/2009    Past Surgical History:  Procedure Laterality Date   ANGIOPLASTY  1991   CATARACT EXTRACTION W/PHACO Right 02/22/2018   Procedure: CATARACT EXTRACTION PHACO AND INTRAOCULAR LENS  PLACEMENT RIGHT EYE CDE=16.62;  Surgeon: Gemma PayorHunt, Kerry, MD;  Location: [facility];  Service: Ophthalmology;  Laterality: Right;  right   CATARACT EXTRACTION W/PHACO Left 03/29/2018   Procedure: CATARACT EXTRACTION PHACO AND INTRAOCULAR LENS PLACEMENT (IOC);  Surgeon: Gemma PayorHunt, Kerry, MD;  Location: [facility];  Service: Ophthalmology;  Laterality: Left;  CDE: 16.37   COLONOSCOPY  2008   CORONARY ARTERY BYPASS GRAFT  04/2005        Home Medications    Prior to Admission medications   Medication Sig Start Date End Date Taking? Authorizing Provider  amLODipine-benazepril (LOTREL) 10-40 MG capsule Take 1 capsule by mouth daily. 05/04/18   Strader, Lennart PallBrittany M, PA-C  atorvastatin (LIPITOR) 80 MG tablet TAKE 1 TABLET ONCE DAILY. 10/19/18   Strader, Lennart PallBrittany M, PA-C  furosemide (LASIX) 20 MG tablet TAKE 1 TABLET BY MOUTH ONCE DAILY. 12/06/18   Antoine PocheBranch, Jonathan F, MD  metoprolol succinate (TOPROL-XL) 50 MG 24 hr tablet Take 1 tablet (50 mg total) by mouth daily with supper. IMMEDIATELY FOLLOWING A MEAL. 05/04/18   Strader, Lennart PallBrittany M, PA-C  warfarin (COUMADIN) 5 MG tablet TAKE 1 TABLET BY MOUTH DAILY WITH SUPPER ON MONDAY AND FRIDAY. TAKE 1/2 TABLET BY MOUTH DAILY WITH SUPPER ON ALL OTHER DAYS. 10/13/18   Antoine PocheBranch, Jonathan F, MD    Family History Family History  Problem Relation Age of Onset   Hypertension Father    Heart attack Father    Diabetes Brother  Social History Social History   Tobacco Use   Smoking status: Never Smoker   Smokeless tobacco: Never Used  Substance Use Topics   Alcohol use: No   Drug use: No     Allergies   Ibuprofen   Review of Systems Review of Systems  Constitutional: Negative for fever.  Eyes: Negative for visual disturbance.  Respiratory: Negative for shortness of breath.   Cardiovascular: Negative for chest pain.  Gastrointestinal: Negative for abdominal pain.  Musculoskeletal: Positive for arthralgias. Negative for back pain, gait problem, neck pain  and neck stiffness.  Skin: Negative for rash and wound.  Neurological: Negative for dizziness, speech difficulty, weakness, light-headedness and numbness.  Hematological: Bruises/bleeds easily.  Psychiatric/Behavioral: Negative for confusion.  All other systems reviewed and are negative.    Physical Exam Updated Vital Signs BP (!) 122/57    Pulse (!) 54    Temp 98.1 F (36.7 C)    Resp 18    Ht 5\' 10"  (1.778 m)    Wt 120 kg    SpO2 98%    BMI 37.96 kg/m   Physical Exam Vitals signs and nursing note reviewed.  Constitutional:      General: He is not in acute distress.    Appearance: He is well-developed. He is not diaphoretic.  HENT:     Head: Normocephalic and atraumatic.  Cardiovascular:     Rate and Rhythm: Normal rate and regular rhythm.     Pulses: Normal pulses.     Heart sounds: Normal heart sounds.  Pulmonary:     Effort: Pulmonary effort is normal.     Breath sounds: Normal breath sounds.  Abdominal:     Palpations: Abdomen is soft.     Tenderness: There is no abdominal tenderness.  Musculoskeletal:        General: Tenderness and signs of injury present.     Right shoulder: He exhibits decreased range of motion, tenderness and pain. He exhibits no swelling, no effusion, no crepitus, no deformity, no laceration, normal pulse and normal strength.     Cervical back: Normal. He exhibits normal range of motion, no tenderness and no bony tenderness.     Thoracic back: He exhibits no tenderness and no bony tenderness.     Lumbar back: He exhibits no tenderness and no bony tenderness.  Skin:    General: Skin is warm and dry.  Neurological:     Mental Status: He is alert and oriented to person, place, and time.  Psychiatric:        Behavior: Behavior normal.      ED Treatments / Results  Labs (all labs ordered are listed, but only abnormal results are displayed) Labs Reviewed - No data to display  EKG None  Radiology Dg Shoulder Right  Result Date:  04/28/2019 CLINICAL DATA:  Status post fall with right shoulder pain. EXAM: RIGHT SHOULDER - 2+ VIEW COMPARISON:  None. FINDINGS: There is no evidence of fracture or dislocation. Soft tissues are unremarkable. IMPRESSION: No acute fracture or dislocation noted. Electronically Signed   By: Abelardo Diesel M.D.   On: 04/28/2019 14:07    Procedures Procedures (including critical care time)  Medications Ordered in ED Medications  acetaminophen (TYLENOL) tablet 650 mg (has no administration in time range)     Initial Impression / Assessment and Plan / ED Course  I have reviewed the triage vital signs and the nursing notes.  Pertinent labs & imaging results that were available during my care of the  patient were reviewed by me and considered in my medical decision making (see chart for details).  Clinical Course as of Apr 27 1432  Thu Apr 28, 2019  525 43-year-old male presents with right shoulder pain after a mechanical fall at home.  Patient is on Coumadin, denies hitting his head or LOC.  Patient reports pain in the right shoulder with any movement of the right shoulder.  Denies pain in the neck or back.  On exam has tenderness to palpation and with range of motion of the right shoulder.  No deformity, no crepitus, no skin changes.  No neck tenderness or back tenderness.  X-ray is negative for dislocation or bony abnormality.  Discussed results with patient, consider rotator cuff injury.  Patient be placed in a shoulder sling, given Tylenol for his pain and referred to orthopedics.  Case discussed with Dr. Effie Shy, ER attending who has seen the patient agrees with plan of care.   [LM]    Clinical Course User Index [LM] Jeannie Fend, PA-C      Final Clinical Impressions(s) / ED Diagnoses   Final diagnoses:  Strain of right shoulder, initial encounter    ED Discharge Orders    None       Jeannie Fend, PA-C 04/28/19 1433    Mancel Bale, MD 04/28/19 1746

## 2019-04-28 NOTE — ED Provider Notes (Signed)
  Face-to-face evaluation   History: He fell this morning, injuring his right shoulder.  He denies neck pain, back pain, head injury, focal weakness or paresthesia.  Physical exam: Obese elderly man.  Mild diffuse right shoulder tenderness without deformity.  He guards against movement of the right shoulder secondary to pain.  No localizing tenderness of the right shoulder.  No evident deformity.  Medical screening examination/treatment/procedure(s) were conducted as a shared visit with non-physician practitioner(s) and myself.  I personally evaluated the patient during the encounter    Daleen Bo, MD 04/28/19 1746

## 2019-05-03 ENCOUNTER — Other Ambulatory Visit: Payer: Self-pay

## 2019-05-03 ENCOUNTER — Ambulatory Visit (INDEPENDENT_AMBULATORY_CARE_PROVIDER_SITE_OTHER): Payer: Medicare Other | Admitting: Family Medicine

## 2019-05-03 ENCOUNTER — Encounter: Payer: Self-pay | Admitting: Family Medicine

## 2019-05-03 VITALS — BP 124/84 | Temp 97.9°F | Ht 69.0 in | Wt 240.0 lb

## 2019-05-03 DIAGNOSIS — M25511 Pain in right shoulder: Secondary | ICD-10-CM

## 2019-05-03 DIAGNOSIS — Z0000 Encounter for general adult medical examination without abnormal findings: Secondary | ICD-10-CM | POA: Diagnosis not present

## 2019-05-03 DIAGNOSIS — I10 Essential (primary) hypertension: Secondary | ICD-10-CM | POA: Diagnosis not present

## 2019-05-03 NOTE — Progress Notes (Addendum)
Subjective:    Patient ID: [name], male    DOB: 1931/04/20, 85 y.o.   MRN: [id number]  HPI  The patient comes in today for a wellness visit. Fall Risk  09/28/2017 06/02/2016 04/10/2015 03/30/2014 03/30/2014  Falls in the past year? Yes No No No No  Number falls in past yr: 2 or more - - - -  Injury with Fall? Yes - - - -  Follow up Education provided - - - -    Patient sustained a fall in his own home landed on his shoulder relates pain discomfort with certain movements.  Denies any other previous troubles.  Relates stiffness with the shoulder difficult time raising the arm above his head  A review of their health history was completed.  A review of medications was also completed.  Any needed refills; yes  Eating habits: trying to eat good- lots of fish  Falls/  MVA accidents in past few months: last week slipped on water at home  Regular exercise: no  Specialist pt sees on regular basis:   Preventative health issues were discussed.   Additional concerns: Er follow up for fall Patient relates stiffness in the shoulder difficult time moving it and extending his arm up above his head   Review of Systems  Constitutional: Negative for activity change, appetite change and fever.  HENT: Negative for congestion and rhinorrhea.   Eyes: Negative for discharge.  Respiratory: Negative for cough and wheezing.   Cardiovascular: Negative for chest pain.  Gastrointestinal: Negative for abdominal pain, blood in stool and vomiting.  Genitourinary: Negative for difficulty urinating and frequency.  Musculoskeletal: Positive for arthralgias. Negative for back pain and neck pain.  Skin: Negative for rash.  Allergic/Immunologic: Negative for environmental allergies and food allergies.  Neurological: Negative for weakness and headaches.  Psychiatric/Behavioral: Negative for agitation.       Objective:   Physical Exam Constitutional:      Appearance: He is well-developed.  HENT:     Head: Normocephalic and atraumatic.     Right Ear: External ear normal.     Left Ear: External ear normal.     Nose: Nose normal.  Eyes:     Pupils: Pupils are equal, round, and reactive to light.  Neck:     Musculoskeletal: Normal range of motion and neck supple.     Thyroid: No thyromegaly.  Cardiovascular:     Rate and Rhythm: Normal rate and regular rhythm.     Heart sounds: Normal heart sounds. No murmur.  Pulmonary:     Effort: Pulmonary effort is normal. No respiratory distress.     Breath sounds: Normal breath sounds. No wheezing.  Abdominal:     General: Bowel sounds are normal. There is no distension.     Palpations: Abdomen is soft. There is no mass.     Tenderness: There is no abdominal tenderness.  Genitourinary:    Penis: Normal.   Musculoskeletal: Normal range of motion.  Lymphadenopathy:     Cervical: No cervical adenopathy.  Skin:    General: Skin is warm and dry.     Findings: No erythema.  Neurological:     Mental Status: He is alert.     Motor: No abnormal muscle tone.  Psychiatric:        Behavior: Behavior normal.        Judgment: Judgment normal.     On examination patient has limited active range of motion passive range of motion is good drop  arm test is negative.  Positive impingement and apprehension      Assessment & Plan:  Adult wellness-complete.wellness physical was conducted today. Importance of diet and exercise were discussed in detail.  In addition to this a discussion regarding safety was also covered. We also reviewed over immunizations and gave recommendations regarding current immunization needed for age.  In addition to this additional areas were also touched on including: Preventative health exams needed:  Colonoscopy not indicated by age  Patient was advised yearly wellness exam  Right shoulder contusion x-rays negative limited range of motion he is to do range of motion exercises over the next 3 to 4 weeks if not seeing  significant improvement notify us we will set up with orthopedics I believe he has a strain of the rotator cuff with possible partial tear

## 2019-05-03 NOTE — Patient Instructions (Signed)
Shoulder Exercises Ask your health care provider which exercises are safe for you. Do exercises exactly as told by your health care provider and adjust them as directed. It is normal to feel mild stretching, pulling, tightness, or discomfort as you do these exercises. Stop right away if you feel sudden pain or your pain gets worse. Do not begin these exercises until told by your health care provider. Stretching exercises External rotation and abduction This exercise is sometimes called corner stretch. This exercise rotates your arm outward (external rotation) and moves your arm out from your body (abduction). 1. Stand in a doorway with one of your feet slightly in front of the other. This is called a staggered stance. If you cannot reach your forearms to the door frame, stand facing a corner of a room. 2. Choose one of the following positions as told by your health care provider: ? Place your hands and forearms on the door frame above your head. ? Place your hands and forearms on the door frame at the height of your head. ? Place your hands on the door frame at the height of your elbows. 3. Slowly move your weight onto your front foot until you feel a stretch across your chest and in the front of your shoulders. Keep your head and chest upright and keep your abdominal muscles tight. 4. Hold for __________ seconds. 5. To release the stretch, shift your weight to your back foot. Repeat __________ times. Complete this exercise __________ times a day. Extension, standing 1. Stand and hold a broomstick, a cane, or a similar object behind your back. ? Your hands should be a little wider than shoulder width apart. ? Your palms should face away from your back. 2. Keeping your elbows straight and your shoulder muscles relaxed, move the stick away from your body until you feel a stretch in your shoulders (extension). ? Avoid shrugging your shoulders while you move the stick. Keep your shoulder blades tucked  down toward the middle of your back. 3. Hold for __________ seconds. 4. Slowly return to the starting position. Repeat __________ times. Complete this exercise __________ times a day. Range-of-motion exercises Pendulum  1. Stand near a wall or a surface that you can hold onto for balance. 2. Bend at the waist and let your left / right arm hang straight down. Use your other arm to support you. Keep your back straight and do not lock your knees. 3. Relax your left / right arm and shoulder muscles, and move your hips and your trunk so your left / right arm swings freely. Your arm should swing because of the motion of your body, not because you are using your arm or shoulder muscles. 4. Keep moving your hips and trunk so your arm swings in the following directions, as told by your health care provider: ? Side to side. ? Forward and backward. ? In clockwise and counterclockwise circles. 5. Continue each motion for __________ seconds, or for as long as told by your health care provider. 6. Slowly return to the starting position. Repeat __________ times. Complete this exercise __________ times a day. Shoulder flexion, standing  1. Stand and hold a broomstick, a cane, or a similar object. Place your hands a little more than shoulder width apart on the object. Your left / right hand should be palm up, and your other hand should be palm down. 2. Keep your elbow straight and your shoulder muscles relaxed. Push the stick up with your healthy arm to   raise your left / right arm in front of your body, and then over your head until you feel a stretch in your shoulder (flexion). ? Avoid shrugging your shoulder while you raise your arm. Keep your shoulder blade tucked down toward the middle of your back. 3. Hold for __________ seconds. 4. Slowly return to the starting position. Repeat __________ times. Complete this exercise __________ times a day. Shoulder abduction, standing 1. Stand and hold a broomstick,  a cane, or a similar object. Place your hands a little more than shoulder width apart on the object. Your left / right hand should be palm up, and your other hand should be palm down. 2. Keep your elbow straight and your shoulder muscles relaxed. Push the object across your body toward your left / right side. Raise your left / right arm to the side of your body (abduction) until you feel a stretch in your shoulder. ? Do not raise your arm above shoulder height unless your health care provider tells you to do that. ? If directed, raise your arm over your head. ? Avoid shrugging your shoulder while you raise your arm. Keep your shoulder blade tucked down toward the middle of your back. 3. Hold for __________ seconds. 4. Slowly return to the starting position. Repeat __________ times. Complete this exercise __________ times a day. Internal rotation  1. Place your left / right hand behind your back, palm up. 2. Use your other hand to dangle an exercise band, a towel, or a similar object over your shoulder. Grasp the band with your left / right hand so you are holding on to both ends. 3. Gently pull up on the band until you feel a stretch in the front of your left / right shoulder. The movement of your arm toward the center of your body is called internal rotation. ? Avoid shrugging your shoulder while you raise your arm. Keep your shoulder blade tucked down toward the middle of your back. 4. Hold for __________ seconds. 5. Release the stretch by letting go of the band and lowering your hands. Repeat __________ times. Complete this exercise __________ times a day. Strengthening exercises External rotation  1. Sit in a stable chair without armrests. 2. Secure an exercise band to a stable object at elbow height on your left / right side. 3. Place a soft object, such as a folded towel or a small pillow, between your left / right upper arm and your body to move your elbow about 4 inches (10 cm) away  from your side. 4. Hold the end of the exercise band so it is tight and there is no slack. 5. Keeping your elbow pressed against the soft object, slowly move your forearm out, away from your abdomen (external rotation). Keep your body steady so only your forearm moves. 6. Hold for __________ seconds. 7. Slowly return to the starting position. Repeat __________ times. Complete this exercise __________ times a day. Shoulder abduction  1. Sit in a stable chair without armrests, or stand up. 2. Hold a __________ weight in your left / right hand, or hold an exercise band with both hands. 3. Start with your arms straight down and your left / right palm facing in, toward your body. 4. Slowly lift your left / right hand out to your side (abduction). Do not lift your hand above shoulder height unless your health care provider tells you that this is safe. ? Keep your arms straight. ? Avoid shrugging your shoulder while you   do this movement. Keep your shoulder blade tucked down toward the middle of your back. 5. Hold for __________ seconds. 6. Slowly lower your arm, and return to the starting position. Repeat __________ times. Complete this exercise __________ times a day. Shoulder extension 1. Sit in a stable chair without armrests, or stand up. 2. Secure an exercise band to a stable object in front of you so it is at shoulder height. 3. Hold one end of the exercise band in each hand. Your palms should face each other. 4. Straighten your elbows and lift your hands up to shoulder height. 5. Step back, away from the secured end of the exercise band, until the band is tight and there is no slack. 6. Squeeze your shoulder blades together as you pull your hands down to the sides of your thighs (extension). Stop when your hands are straight down by your sides. Do not let your hands go behind your body. 7. Hold for __________ seconds. 8. Slowly return to the starting position. Repeat __________ times.  Complete this exercise __________ times a day. Shoulder row 1. Sit in a stable chair without armrests, or stand up. 2. Secure an exercise band to a stable object in front of you so it is at waist height. 3. Hold one end of the exercise band in each hand. Position your palms so that your thumbs are facing the ceiling (neutral position). 4. Bend each of your elbows to a 90-degree angle (right angle) and keep your upper arms at your sides. 5. Step back until the band is tight and there is no slack. 6. Slowly pull your elbows back behind you. 7. Hold for __________ seconds. 8. Slowly return to the starting position. Repeat __________ times. Complete this exercise __________ times a day. Shoulder press-ups  1. Sit in a stable chair that has armrests. Sit upright, with your feet flat on the floor. 2. Put your hands on the armrests so your elbows are bent and your fingers are pointing forward. Your hands should be about even with the sides of your body. 3. Push down on the armrests and use your arms to lift yourself off the chair. Straighten your elbows and lift yourself up as much as you comfortably can. ? Move your shoulder blades down, and avoid letting your shoulders move up toward your ears. ? Keep your feet on the ground. As you get stronger, your feet should support less of your body weight as you lift yourself up. 4. Hold for __________ seconds. 5. Slowly lower yourself back into the chair. Repeat __________ times. Complete this exercise __________ times a day. Wall push-ups  1. Stand so you are facing a stable wall. Your feet should be about one arm-length away from the wall. 2. Lean forward and place your palms on the wall at shoulder height. 3. Keep your feet flat on the floor as you bend your elbows and lean forward toward the wall. 4. Hold for __________ seconds. 5. Straighten your elbows to push yourself back to the starting position. Repeat __________ times. Complete this exercise  __________ times a day. This information is not intended to replace advice given to you by your health care provider. Make sure you discuss any questions you have with your health care provider. Document Released: 04/30/2005 Document Revised: 10/08/2018 Document Reviewed: 07/16/2018 [employer] Patient Education  2020 [employer]  

## 2019-05-04 LAB — CBC WITH DIFFERENTIAL/PLATELET
Basophils Absolute: 0 10*3/uL (ref 0.0–0.2)
Basos: 0 %
EOS (ABSOLUTE): 0.1 10*3/uL (ref 0.0–0.4)
Eos: 1 %
Hematocrit: 44.3 % (ref 37.5–51.0)
Hemoglobin: 15.6 g/dL (ref 13.0–17.7)
Immature Grans (Abs): 0 10*3/uL (ref 0.0–0.1)
Immature Granulocytes: 0 %
Lymphocytes Absolute: 1.6 10*3/uL (ref 0.7–3.1)
Lymphs: 17 %
MCH: 33.1 pg — ABNORMAL HIGH (ref 26.6–33.0)
MCHC: 35.2 g/dL (ref 31.5–35.7)
MCV: 94 fL (ref 79–97)
Monocytes Absolute: 0.8 10*3/uL (ref 0.1–0.9)
Monocytes: 9 %
Neutrophils Absolute: 6.9 10*3/uL (ref 1.4–7.0)
Neutrophils: 73 %
Platelets: 251 10*3/uL (ref 150–450)
RBC: 4.71 x10E6/uL (ref 4.14–5.80)
RDW: 12.4 % (ref 11.6–15.4)
WBC: 9.4 10*3/uL (ref 3.4–10.8)

## 2019-05-04 LAB — BASIC METABOLIC PANEL
BUN/Creatinine Ratio: 16 (ref 10–24)
BUN: 15 mg/dL (ref 8–27)
CO2: 22 mmol/L (ref 20–29)
Calcium: 9.6 mg/dL (ref 8.6–10.2)
Chloride: 101 mmol/L (ref 96–106)
Creatinine, Ser: 0.96 mg/dL (ref 0.76–1.27)
GFR calc Af Amer: 82 mL/min/{1.73_m2} (ref >59)
GFR calc non Af Amer: 71 mL/min/{1.73_m2} (ref >59)
Glucose: 106 mg/dL — ABNORMAL HIGH (ref 65–99)
Potassium: 4.9 mmol/L (ref 3.5–5.2)
Sodium: 140 mmol/L (ref 134–144)

## 2019-05-04 LAB — LIPID PANEL
Chol/HDL Ratio: 2.3 ratio (ref 0.0–5.0)
Cholesterol, Total: 129 mg/dL (ref 100–199)
HDL: 56 mg/dL (ref >39)
LDL Chol Calc (NIH): 59 mg/dL (ref 0–99)
Triglycerides: 69 mg/dL (ref 0–149)
VLDL Cholesterol Cal: 14 mg/dL (ref 5–40)

## 2019-05-06 ENCOUNTER — Encounter: Payer: Self-pay | Admitting: Family Medicine

## 2019-05-07 ENCOUNTER — Encounter: Payer: Self-pay | Admitting: Family Medicine

## 2019-05-24 ENCOUNTER — Other Ambulatory Visit: Payer: Self-pay

## 2019-05-24 MED ORDER — METOPROLOL SUCCINATE ER 50 MG PO TB24: 50.0000 mg | ORAL_TABLET | Freq: Every day | ORAL | 3 refills | Status: DC

## 2019-05-24 MED ORDER — AMLODIPINE BESY-BENAZEPRIL HCL 10-40 MG PO CAPS: 1.0000 | ORAL_CAPSULE | Freq: Every day | ORAL | 3 refills | Status: DC

## 2019-05-24 NOTE — Telephone Encounter (Signed)
Refilled metoprolol and amlodipine/benazapril

## 2019-06-01 ENCOUNTER — Ambulatory Visit (INDEPENDENT_AMBULATORY_CARE_PROVIDER_SITE_OTHER): Payer: Medicare Other | Admitting: *Deleted

## 2019-06-01 ENCOUNTER — Other Ambulatory Visit: Payer: Self-pay

## 2019-06-01 DIAGNOSIS — I4891 Unspecified atrial fibrillation: Secondary | ICD-10-CM | POA: Diagnosis not present

## 2019-06-01 DIAGNOSIS — Z5181 Encounter for therapeutic drug level monitoring: Secondary | ICD-10-CM

## 2019-06-01 LAB — POCT INR: INR: 2.7 (ref 2.0–3.0)

## 2019-06-01 NOTE — Patient Instructions (Signed)
Continue 1/2 tablet daily except 1 tablet on Mondays and Fridays.  Recheck in 6 weeks. 

## 2019-07-13 ENCOUNTER — Other Ambulatory Visit: Payer: Self-pay

## 2019-07-13 ENCOUNTER — Ambulatory Visit (INDEPENDENT_AMBULATORY_CARE_PROVIDER_SITE_OTHER): Payer: Medicare PPO | Admitting: *Deleted

## 2019-07-13 ENCOUNTER — Encounter: Payer: Self-pay | Admitting: *Deleted

## 2019-07-13 DIAGNOSIS — I4891 Unspecified atrial fibrillation: Secondary | ICD-10-CM | POA: Diagnosis not present

## 2019-07-13 DIAGNOSIS — Z5181 Encounter for therapeutic drug level monitoring: Secondary | ICD-10-CM | POA: Diagnosis not present

## 2019-07-13 LAB — POCT INR: INR: 3 (ref 2.0–3.0)

## 2019-07-13 NOTE — Patient Instructions (Signed)
Continue 1/2 tablet daily except 1 tablet on Mondays and Fridays.  Recheck in 6 weeks. 

## 2019-08-22 ENCOUNTER — Encounter: Payer: Self-pay | Admitting: Cardiology

## 2019-08-22 ENCOUNTER — Telehealth: Payer: Self-pay | Admitting: Cardiology

## 2019-08-22 ENCOUNTER — Telehealth: Payer: Self-pay | Admitting: Licensed Clinical Social Worker

## 2019-08-22 ENCOUNTER — Telehealth (INDEPENDENT_AMBULATORY_CARE_PROVIDER_SITE_OTHER): Payer: Medicare PPO | Admitting: Cardiology

## 2019-08-22 VITALS — Ht 69.0 in | Wt 240.0 lb

## 2019-08-22 DIAGNOSIS — E782 Mixed hyperlipidemia: Secondary | ICD-10-CM

## 2019-08-22 DIAGNOSIS — I2510 Atherosclerotic heart disease of native coronary artery without angina pectoris: Secondary | ICD-10-CM

## 2019-08-22 DIAGNOSIS — I255 Ischemic cardiomyopathy: Secondary | ICD-10-CM

## 2019-08-22 DIAGNOSIS — I10 Essential (primary) hypertension: Secondary | ICD-10-CM

## 2019-08-22 DIAGNOSIS — I4891 Unspecified atrial fibrillation: Secondary | ICD-10-CM

## 2019-08-22 NOTE — Telephone Encounter (Signed)
Virtual Visit Pre-Appointment Phone Call  "(Name), I am calling you today to discuss your upcoming appointment. We are currently trying to limit exposure to the virus that causes COVID-19 by seeing patients at home rather than in the office."  1. "What is the BEST phone number to call the day of the visit?" - include this in appointment notes  2. Do you have or have access to (through a family member/friend) a smartphone with video capability that we can use for your visit?" a. If yes - list this number in appt notes as cell (if different from BEST phone #) and list the appointment type as a VIDEO visit in appointment notes b. If no - list the appointment type as a PHONE visit in appointment notes  3. Confirm consent - "In the setting of the current Covid19 crisis, you are scheduled for a (phone or video) visit with your provider on (date) at (time).  Just as we do with many in-office visits, in order for you to participate in this visit, we must obtain consent.  If you'd like, I can send this to your mychart (if signed up) or email for you to review.  Otherwise, I can obtain your verbal consent now.  All virtual visits are billed to your insurance company just like a normal visit would be.  By agreeing to a virtual visit, we'd like you to understand that the technology does not allow for your provider to perform an examination, and thus may limit your provider's ability to fully assess your condition. If your provider identifies any concerns that need to be evaluated in person, we will make arrangements to do so.  Finally, though the technology is pretty good, we cannot assure that it will always work on either your or our end, and in the setting of a video visit, we may have to convert it to a phone-only visit.  In either situation, we cannot ensure that we have a secure connection.  Are you willing to proceed?" STAFF: Did the patient verbally acknowledge consent to telehealth visit? Document  YES/NO here: yes  4. Advise patient to be prepared - "Two hours prior to your appointment, go ahead and check your blood pressure, pulse, oxygen saturation, and your weight (if you have the equipment to check those) and write them all down. When your visit starts, your provider will ask you for this information. If you have an Apple Watch or Kardia device, please plan to have heart rate information ready on the day of your appointment. Please have a pen and paper handy nearby the day of the visit as well."  5. Give patient instructions for MyChart download to smartphone OR Doximity/Doxy.me as below if video visit (depending on what platform provider is using)  6. Inform patient they will receive a phone call 15 minutes prior to their appointment time (may be from unknown caller ID) so they should be prepared to answer    TELEPHONE CALL NOTE  [name] has been deemed a candidate for a follow-up tele-health visit to limit community exposure during the Covid-19 pandemic. I spoke with the patient via phone to ensure availability of phone/video source, confirm preferred email & phone number, and discuss instructions and expectations.  I reminded [name] to be prepared with any vital sign and/or heart rhythm information that could potentially be obtained via home monitoring, at the time of his visit. I reminded [name] to expect a phone call prior to  his visit.  Geraldine Contras 08/22/2019 10:19 AM   INSTRUCTIONS FOR DOWNLOADING THE MYCHART APP TO SMARTPHONE  - The patient must first make sure to have activated MyChart and know their login information - If Apple, go to [employer] and type in MyChart in the search bar and download the app. If Android, ask patient to go to [employer] and type in [place] in the search bar and download the app. The app is free but as with any other app downloads, their phone may require them to verify saved payment information or Apple/Android  password.  - The patient will need to then log into the app with their MyChart username and password, and select [facility] as their healthcare provider to link the account. When it is time for your visit, go to the MyChart app, find appointments, and click Begin Video Visit. Be sure to Select Allow for your device to access the Microphone and Camera for your visit. You will then be connected, and your provider will be with you shortly.  **If they have any issues connecting, or need assistance please contact MyChart service desk [phone])**  **If using a computer, in order to ensure the best quality for their visit they will need to use either of the following Internet Browsers: [employer] or Google Chrome**  IF USING DOXIMITY or DOXY.ME - The patient will receive a link just prior to their visit by text.     FULL LENGTH CONSENT FOR TELE-HEALTH VISIT   I hereby voluntarily request, consent and authorize [facility] and its employed or contracted physicians, physician assistants, nurse practitioners or other licensed health care professionals (the Practitioner), to provide me with telemedicine health care services (the Services") as deemed necessary by the treating Practitioner. I acknowledge and consent to receive the Services by the Practitioner via telemedicine. I understand that the telemedicine visit will involve communicating with the Practitioner through live audiovisual communication technology and the disclosure of certain medical information by electronic transmission. I acknowledge that I have been given the opportunity to request an in-person assessment or other available alternative prior to the telemedicine visit and am voluntarily participating in the telemedicine visit.  I understand that I have the right to withhold or withdraw my consent to the use of telemedicine in the course of my care at any time, without affecting my right to future care or treatment,  and that the Practitioner or I may terminate the telemedicine visit at any time. I understand that I have the right to inspect all information obtained and/or recorded in the course of the telemedicine visit and may receive copies of available information for a reasonable fee.  I understand that some of the potential risks of receiving the Services via telemedicine include:   Delay or interruption in medical evaluation due to technological equipment failure or disruption;  Information transmitted may not be sufficient (e.g. poor resolution of images) to allow for appropriate medical decision making by the Practitioner; and/or   In rare instances, security protocols could fail, causing a breach of personal health information.  Furthermore, I acknowledge that it is my responsibility to provide information about my medical history, conditions and care that is complete and accurate to the best of my ability. I acknowledge that Practitioner's advice, recommendations, and/or decision may be based on factors not within their control, such as incomplete or inaccurate data provided by me or distortions of diagnostic images or specimens that may result from electronic transmissions. I  understand that the practice of medicine is not an exact science and that Practitioner makes no warranties or guarantees regarding treatment outcomes. I acknowledge that I will receive a copy of this consent concurrently upon execution via email to the email address I last provided but may also request a printed copy by calling the office of [place].    I understand that my insurance will be billed for this visit.   I have read or had this consent read to me.  I understand the contents of this consent, which adequately explains the benefits and risks of the Services being provided via telemedicine.   I have been provided ample opportunity to ask questions regarding this consent and the Services and have had my questions  answered to my satisfaction.  I give my informed consent for the services to be provided through the use of telemedicine in my medical care  By participating in this telemedicine visit I agree to the above.

## 2019-08-22 NOTE — Patient Instructions (Addendum)
Medication Instructions:   Your physician recommends that you continue on your current medications as directed. Please refer to the Current Medication list given to you today.  Labwork:  NONE  Testing/Procedures:  NONE  Follow-Up:  Your physician recommends that you schedule a follow-up appointment in: 6 months (office or virtual). You will receive a reminder letter in the mail in about 4 months reminding you to call and schedule your appointment. If you don't receive this letter, please contact our office.  Any Other Special Instructions Will Be Listed Below (If Applicable).  If you need a refill on your cardiac medications before your next appointment, please call your pharmacy. 

## 2019-08-22 NOTE — Telephone Encounter (Signed)
CSW referred to assist patient with obtaining a BP cuff. CSW contacted patient to inform cuff will be delivered to home. Patient grateful for support and assistance. CSW available as needed. Jackie Janiqua Friscia, LCSW, CCSW-MCS [phone]  

## 2019-08-22 NOTE — Progress Notes (Signed)
Virtual Visit via Telephone Note   This visit type was conducted due to national recommendations for restrictions regarding the COVID-19 Pandemic (e.g. social distancing) in an effort to limit this patient's exposure and mitigate transmission in our community.  Due to his co-morbid illnesses, this patient is at least at moderate risk for complications without adequate follow up.  This format is felt to be most appropriate for this patient at this time.  The patient did not have access to video technology/had technical difficulties with video requiring transitioning to audio format only (telephone).  All issues noted in this document were discussed and addressed.  No physical exam could be performed with this format.  Please refer to the patient's chart for his  consent to telehealth for [facility].   Date:  08/22/2019   ID:  [name], DOB [birth year], MRN [id number]  Patient Location: Home Provider Location: Office  PCP:  Kathyrn Drown, MD  Cardiologist:  Carlyle Dolly, MD  Electrophysiologist:  None   Evaluation Performed:  Follow-Up Visit  Chief Complaint:  Follow up  History of Present Illness:    [name] is a 86 y.o. male seen today for follow up of the following medical problems.   1. CAD/ICM  - prior CABG in Nov 2006 at [facility] - echo 01/2016 LVEF 45-50%, severe LVH, apical hypokinesis, indeterminate diastolic function due to afib.    - no recent chest pain - no SOB/DOE, no LE edema -compliant with meds   2. Afib  -Has not been interested in NOACs  - no recent palpitations - no bleeding on coumadin  3. HTN  - compliant with meds  4. HL   05/2019 TC 129 TG 69 LDL 59 HDL 56 Compliant with statin   SH: wife passed within the last few years    The patient does not have symptoms concerning for COVID-19 infection (fever, chills, cough, or new shortness of breath).    Past Medical History:  Diagnosis Date  . Anticoagulant  disorder ([place])   . ASCVD (arteriosclerotic cardiovascular disease)    CABG 11/06; MUGA 11/08 - 44%; 3/07 mildly decreased EF on echo  . Atrial fibrillation ([place])    Initially occurred following CABG; event recorder in 11/08 - no AF; recurred 08/2009  . CHF (congestive heart failure) ([place])   . Coronary artery disease   . History of kidney stones   . HOH (hard of hearing)   . Hyperlipidemia   . Hypertension   . Myocardial infarction ([place])   . Nephrolithiasis   . Overweight(278.02)    Past Surgical History:  Procedure Laterality Date  . ANGIOPLASTY  1991  . CATARACT EXTRACTION W/PHACO Right 02/22/2018   Procedure: CATARACT EXTRACTION PHACO AND INTRAOCULAR LENS PLACEMENT RIGHT EYE CDE=16.62;  Surgeon: Tonny Calib Wadhwa, MD;  Location: [facility];  Service: Ophthalmology;  Laterality: Right;  right  . CATARACT EXTRACTION W/PHACO Left 03/29/2018   Procedure: CATARACT EXTRACTION PHACO AND INTRAOCULAR LENS PLACEMENT (IOC);  Surgeon: Tonny Damarrion Mimbs, MD;  Location: [facility];  Service: Ophthalmology;  Laterality: Left;  CDE: 16.37  . COLONOSCOPY  2008  . CORONARY ARTERY BYPASS GRAFT  04/2005     Current Meds  Medication Sig  . amLODipine-benazepril (LOTREL) 10-40 MG capsule Take 1 capsule by mouth daily.  [name] atorvastatin (LIPITOR) 80 MG tablet TAKE 1 TABLET ONCE DAILY.  . furosemide (LASIX) 20 MG tablet TAKE 1 TABLET BY MOUTH ONCE DAILY.  . metoprolol succinate (TOPROL-XL) 50 MG 24 hr  tablet Take 1 tablet (50 mg total) by mouth daily with supper. IMMEDIATELY FOLLOWING A MEAL.  [name] warfarin (COUMADIN) 5 MG tablet TAKE 1 TABLET BY MOUTH DAILY WITH SUPPER ON MONDAY AND FRIDAY. TAKE 1/2 TABLET BY MOUTH DAILY WITH SUPPER ON ALL OTHER DAYS.     Allergies:   Ibuprofen   Social History   Tobacco Use  . Smoking status: Never Smoker  . Smokeless tobacco: Never Used  Substance Use Topics  . Alcohol use: No  . Drug use: No     Family Hx: The patient's family history includes Diabetes in his brother; Heart attack  in his father; Hypertension in his father.  ROS:   Please see the history of present illness.     All other systems reviewed and are negative.   Prior CV studies:   The following studies were reviewed today:  July 2011 Echo: mild to moderately decreased LVEF (no LVEF given), apex akinetic, severe LVH, severe LAE   05/05/13 Clinic EKG: afib at 58 bpm, LAD, anteroseptal and anterolateral Q waves, inferior Q waves   05/2013 Echo Technically difficult study, severe LVH, elevated LA pressure. Difficult to evalute LV function even with contrast, appears low normal to normal.   01/2016 echo Study Conclusions  - Left ventricle: The cavity size was normal. Wall thickness was increased in a pattern of severe LVH. Systolic function was mildly reduced. The estimated ejection fraction was in the range of 45% to 50%. There is hypokinesis of the mid-apicalanteroseptal and apical myocardium. - Aortic valve: Trileaflet; moderately calcified leaflets. There was trivial regurgitation. - Aortic root: The aortic root was mildly dilated. - Mitral valve: Calcified annulus. There was trivial regurgitation. - Left atrium: The atrium was moderately dilated. - Right atrium: The atrium was mildly dilated. Central venous pressure (est): 8 mm Hg. - Tricuspid valve: There was trivial regurgitation. - Pulmonary arteries: Systolic pressure could not be accurately estimated. - Pericardium, extracardiac: There was no pericardial effusion.  Impressions:  - Severe LVH with LVEF approximately 45-50%. There is hypokinesis of the mid to apical anteroseptal wall and apex, no obvious LV mural thrombus with Definity contrast. Indeterminate diastolic function in the setting of atrial fibrillation. Moderate left atrial enlargement. MAC with trivial mitral regurgitation. Mildly dilated aortic root with sclerotic aortic valve and trivial aortic stenosis. Mildly dilated right atrium.  Trivial tricuspid regurgitation.  Labs/Other Tests and Data Reviewed:    EKG:  No ECG reviewed.  Recent Labs: 05/03/2019: BUN 15; Creatinine, Ser 0.96; Hemoglobin 15.6; Platelets 251; Potassium 4.9; Sodium 140   Recent Lipid Panel Lab Results  Component Value Date/Time   CHOL 129 05/03/2019 10:08 AM   TRIG 69 05/03/2019 10:08 AM   HDL 56 05/03/2019 10:08 AM   CHOLHDL 2.3 05/03/2019 10:08 AM   CHOLHDL 2.6 03/20/2014 08:37 AM   LDLCALC 59 05/03/2019 10:08 AM    Wt Readings from Last 3 Encounters:  08/22/19 240 lb (108.9 kg)  05/03/19 240 lb (108.9 kg)  04/28/19 264 lb 8.8 oz (120 kg)     Objective:    Vital Signs:  Ht _0  (1.753 m)   Wt 240 lb (108.9 kg)   BMI 35.44 kg/m    Normal affect. Normal speech pattern and tone. Comfortable, no apparent distress. No audible signs of SOB or wheezing.   ASSESSMENT & PLAN:    1. CAD/ICM - denies any recent symptoms - continue current med  2. Afib  - no symptoms - has not been interested in  NOACs, continue coumain  3. HTN  - continue current meds  4. Hyperlipidemia  LDL at goal, continue statin    COVID-19 Education: The signs and symptoms of COVID-19 were discussed with the patient and how to seek care for testing (follow up with PCP or arrange E-visit).  The importance of social distancing was discussed today.  Time:   Today, I have spent 20 minutes with the patient with telehealth technology discussing the above problems.     Medication Adjustments/Labs and Tests Ordered: Current medicines are reviewed at length with the patient today.  Concerns regarding medicines are outlined above.   Tests Ordered: No orders of the defined types were placed in this encounter.   Medication Changes: No orders of the defined types were placed in this encounter.   Follow Up:  Either In Person or Virtual in 6 month(s)  Signed, Carlyle Dolly, MD  08/22/2019 10:31 AM    [place]

## 2019-08-24 ENCOUNTER — Ambulatory Visit (INDEPENDENT_AMBULATORY_CARE_PROVIDER_SITE_OTHER): Payer: Medicare PPO | Admitting: *Deleted

## 2019-08-24 ENCOUNTER — Other Ambulatory Visit: Payer: Self-pay

## 2019-08-24 DIAGNOSIS — I4891 Unspecified atrial fibrillation: Secondary | ICD-10-CM | POA: Diagnosis not present

## 2019-08-24 DIAGNOSIS — Z5181 Encounter for therapeutic drug level monitoring: Secondary | ICD-10-CM | POA: Diagnosis not present

## 2019-08-24 LAB — POCT INR: INR: 3 (ref 2.0–3.0)

## 2019-08-24 NOTE — Patient Instructions (Signed)
Continue 1/2 tablet daily except 1 tablet on Mondays and Fridays.  Recheck in 6 weeks. 

## 2019-09-21 ENCOUNTER — Other Ambulatory Visit: Payer: Self-pay | Admitting: Cardiology

## 2019-10-05 ENCOUNTER — Ambulatory Visit (INDEPENDENT_AMBULATORY_CARE_PROVIDER_SITE_OTHER): Payer: Medicare PPO | Admitting: *Deleted

## 2019-10-05 ENCOUNTER — Other Ambulatory Visit: Payer: Self-pay

## 2019-10-05 DIAGNOSIS — Z5181 Encounter for therapeutic drug level monitoring: Secondary | ICD-10-CM

## 2019-10-05 DIAGNOSIS — I4891 Unspecified atrial fibrillation: Secondary | ICD-10-CM | POA: Diagnosis not present

## 2019-10-05 LAB — POCT INR: INR: 2.7 (ref 2.0–3.0)

## 2019-10-05 NOTE — Patient Instructions (Signed)
Continue 1/2 tablet daily except 1 tablet on Mondays and Fridays.  Recheck in 6 weeks. 

## 2019-10-17 ENCOUNTER — Other Ambulatory Visit: Payer: Self-pay | Admitting: Cardiology

## 2019-11-15 ENCOUNTER — Other Ambulatory Visit: Payer: Self-pay | Admitting: Cardiology

## 2019-11-16 ENCOUNTER — Ambulatory Visit (INDEPENDENT_AMBULATORY_CARE_PROVIDER_SITE_OTHER): Payer: Medicare PPO | Admitting: Pharmacist

## 2019-11-16 ENCOUNTER — Other Ambulatory Visit: Payer: Self-pay

## 2019-11-16 DIAGNOSIS — I4891 Unspecified atrial fibrillation: Secondary | ICD-10-CM | POA: Diagnosis not present

## 2019-11-16 DIAGNOSIS — Z5181 Encounter for therapeutic drug level monitoring: Secondary | ICD-10-CM | POA: Diagnosis not present

## 2019-11-16 LAB — POCT INR: INR: 3.1 — AB (ref 2.0–3.0)

## 2019-11-16 NOTE — Patient Instructions (Signed)
Description   Eat a few greens today. Continue 1/2 tablet daily except 1 tablet on Mondays and Fridays.  Recheck in 5 weeks.

## 2019-12-15 ENCOUNTER — Other Ambulatory Visit: Payer: Self-pay | Admitting: Cardiology

## 2019-12-16 ENCOUNTER — Other Ambulatory Visit: Payer: Self-pay

## 2019-12-16 MED ORDER — FUROSEMIDE 20 MG PO TABS: 20.0000 mg | ORAL_TABLET | Freq: Every day | ORAL | 11 refills | Status: DC

## 2019-12-16 NOTE — Telephone Encounter (Signed)
Refilled lasix

## 2019-12-21 ENCOUNTER — Encounter: Payer: Medicare PPO | Admitting: *Deleted

## 2019-12-21 ENCOUNTER — Other Ambulatory Visit: Payer: Self-pay

## 2019-12-21 ENCOUNTER — Ambulatory Visit (INDEPENDENT_AMBULATORY_CARE_PROVIDER_SITE_OTHER): Payer: Medicare PPO | Admitting: *Deleted

## 2019-12-21 DIAGNOSIS — Z5181 Encounter for therapeutic drug level monitoring: Secondary | ICD-10-CM | POA: Diagnosis not present

## 2019-12-21 DIAGNOSIS — I4891 Unspecified atrial fibrillation: Secondary | ICD-10-CM

## 2019-12-21 LAB — POCT INR: INR: 3.1 — AB (ref 2.0–3.0)

## 2019-12-21 NOTE — Patient Instructions (Signed)
Eat a few greens today. Continue 1/2 tablet daily except 1 tablet on Mondays and Fridays.  Recheck in 5 weeks.

## 2020-01-25 ENCOUNTER — Ambulatory Visit (INDEPENDENT_AMBULATORY_CARE_PROVIDER_SITE_OTHER): Payer: Medicare PPO | Admitting: Cardiology

## 2020-01-25 ENCOUNTER — Encounter: Payer: Self-pay | Admitting: Cardiology

## 2020-01-25 ENCOUNTER — Ambulatory Visit (INDEPENDENT_AMBULATORY_CARE_PROVIDER_SITE_OTHER): Payer: Medicare PPO | Admitting: *Deleted

## 2020-01-25 VITALS — BP 138/70 | HR 62 | Ht 69.0 in | Wt 245.8 lb

## 2020-01-25 DIAGNOSIS — I2510 Atherosclerotic heart disease of native coronary artery without angina pectoris: Secondary | ICD-10-CM | POA: Diagnosis not present

## 2020-01-25 DIAGNOSIS — Z5181 Encounter for therapeutic drug level monitoring: Secondary | ICD-10-CM | POA: Diagnosis not present

## 2020-01-25 DIAGNOSIS — I10 Essential (primary) hypertension: Secondary | ICD-10-CM

## 2020-01-25 DIAGNOSIS — I4891 Unspecified atrial fibrillation: Secondary | ICD-10-CM | POA: Diagnosis not present

## 2020-01-25 DIAGNOSIS — E782 Mixed hyperlipidemia: Secondary | ICD-10-CM | POA: Diagnosis not present

## 2020-01-25 LAB — POCT INR: INR: 3 (ref 2.0–3.0)

## 2020-01-25 NOTE — Progress Notes (Signed)
Clinical Summary [name] is a 86 y.o.male seen today for follow up of the following medical problems.  1. CAD/ICM  - prior CABG in Nov 2006 at [facility] - echo 01/2016 LVEF 45-50%, severe LVH, apical hypokinesis, indeterminate diastolic function due to afib.     - no chest pain. No SOB or DOE. Mild LE edema at times - compliant with meds   2. Afib  -Has not been interested in NOACs  -no palpitations - no bleeeding on coumadin  3. HTN  - compliant with meds  4. HL   05/2019 TC 129 TG 69 LDL 59 HDL 56 He is compliant with statin   UJ:WJXB passed within the last few years    Past Medical History:  Diagnosis Date   Anticoagulant disorder ([place])    ASCVD (arteriosclerotic cardiovascular disease)    CABG 11/06; MUGA 11/08 - 44%; 3/07 mildly decreased EF on echo   Atrial fibrillation ([place])    Initially occurred following CABG; event recorder in 11/08 - no AF; recurred 08/2009   CHF (congestive heart failure) ([facility])    Coronary artery disease    History of kidney stones    HOH (hard of hearing)    Hyperlipidemia    Hypertension    Myocardial infarction ([place])    Nephrolithiasis    Overweight(278.02)      Allergies  Allergen Reactions   Ibuprofen Rash and Other (See Comments)    Skin rash     Current Outpatient Medications  Medication Sig Dispense Refill   warfarin (COUMADIN) 5 MG tablet TAKE 1 TABLET WITH SUPPER ON MONDAY AND FRIDAY. TAKE 1/2 TABLET WITH SUPPER ON ALL OTHER DAYS. 24 tablet 6   amLODipine-benazepril (LOTREL) 10-40 MG capsule Take 1 capsule by mouth daily. 90 capsule 3   atorvastatin (LIPITOR) 80 MG tablet TAKE 1 TABLET ONCE DAILY. 30 tablet 9   furosemide (LASIX) 20 MG tablet Take 1 tablet (20 mg total) by mouth daily. 30 tablet 11   metoprolol succinate (TOPROL-XL) 50 MG 24 hr tablet Take 1 tablet (50 mg total) by mouth daily with supper. IMMEDIATELY FOLLOWING A MEAL. 90 tablet 3   No current  facility-administered medications for this visit.     Past Surgical History:  Procedure Laterality Date   ANGIOPLASTY  1991   CATARACT EXTRACTION W/PHACO Right 02/22/2018   Procedure: CATARACT EXTRACTION PHACO AND INTRAOCULAR LENS PLACEMENT RIGHT EYE CDE=16.62;  Surgeon: Tonny Lanea Vankirk, MD;  Location: [facility];  Service: Ophthalmology;  Laterality: Right;  right   CATARACT EXTRACTION W/PHACO Left 03/29/2018   Procedure: CATARACT EXTRACTION PHACO AND INTRAOCULAR LENS PLACEMENT (IOC);  Surgeon: Tonny Jonette Wassel, MD;  Location: [facility];  Service: Ophthalmology;  Laterality: Left;  CDE: 16.37   COLONOSCOPY  2008   CORONARY ARTERY BYPASS GRAFT  04/2005     Allergies  Allergen Reactions   Ibuprofen Rash and Other (See Comments)    Skin rash      Family History  Problem Relation Age of Onset   Hypertension Father    Heart attack Father    Diabetes Brother      Social History Mr. Pidcock reports that he has never smoked. He has never used smokeless tobacco. Mr. Duchene reports no history of alcohol use.   Review of Systems CONSTITUTIONAL: No weight loss, fever, chills, weakness or fatigue.  HEENT: Eyes: No visual loss, blurred vision, double vision or yellow sclerae.No hearing loss, sneezing, congestion, runny nose or sore throat.  SKIN: No  rash or itching.  CARDIOVASCULAR: per hpi RESPIRATORY: No shortness of breath, cough or sputum.  GASTROINTESTINAL: No anorexia, nausea, vomiting or diarrhea. No abdominal pain or blood.  GENITOURINARY: No burning on urination, no polyuria NEUROLOGICAL: No headache, dizziness, syncope, paralysis, ataxia, numbness or tingling in the extremities. No change in bowel or bladder control.  MUSCULOSKELETAL: No muscle, back pain, joint pain or stiffness.  LYMPHATICS: No enlarged nodes. No history of splenectomy.  PSYCHIATRIC: No history of depression or anxiety.  ENDOCRINOLOGIC: No reports of sweating, cold or heat intolerance. No polyuria or polydipsia.   [name]   Physical Examination Today's Vitals   01/25/20 0926  BP: (!) 138/70  Pulse: 62  SpO2: 98%  Weight: (!) 245 lb 12.8 oz (111.5 kg)  Height: _0  (1.753 m)   Body mass index is 36.3 kg/m.  Gen: resting comfortably, no acute distress HEENT: no scleral icterus, pupils equal round and reactive, no palptable cervical adenopathy,  CV: irreg, no mr/g no jvd Resp: Clear to auscultation bilaterally GI: abdomen is soft, non-tender, non-distended, normal bowel sounds, no hepatosplenomegaly MSK: extremities are warm, no edema.  Skin: warm, no rash Neuro:  no focal deficits Psych: appropriate affect   Diagnostic Studies July 2011 Echo: mild to moderately decreased LVEF (no LVEF given), apex akinetic, severe LVH, severe LAE   05/05/13 Clinic EKG: afib at 58 bpm, LAD, anteroseptal and anterolateral Q waves, inferior Q waves   05/2013 Echo Technically difficult study, severe LVH, elevated LA pressure. Difficult to evalute LV function even with contrast, appears low normal to normal.   01/2016 echo Study Conclusions  - Left ventricle: The cavity size was normal. Wall thickness was increased in a pattern of severe LVH. Systolic function was mildly reduced. The estimated ejection fraction was in the range of 45% to 50%. There is hypokinesis of the mid-apicalanteroseptal and apical myocardium. - Aortic valve: Trileaflet; moderately calcified leaflets. There was trivial regurgitation. - Aortic root: The aortic root was mildly dilated. - Mitral valve: Calcified annulus. There was trivial regurgitation. - Left atrium: The atrium was moderately dilated. - Right atrium: The atrium was mildly dilated. Central venous pressure (est): 8 mm Hg. - Tricuspid valve: There was trivial regurgitation. - Pulmonary arteries: Systolic pressure could not be accurately estimated. - Pericardium, extracardiac: There was no pericardial effusion.  Impressions:  - Severe LVH  with LVEF approximately 45-50%. There is hypokinesis of the mid to apical anteroseptal wall and apex, no obvious LV mural thrombus with Definity contrast. Indeterminate diastolic function in the setting of atrial fibrillation. Moderate left atrial enlargement. MAC with trivial mitral regurgitation. Mildly dilated aortic root with sclerotic aortic valve and trivial aortic stenosis. Mildly dilated right atrium. Trivial tricuspid regurgitation.    Assessment and Plan  1. CAD/ICM - no recent symptoms, continue current meds  2. Afib  - has not been interested in NOACs, continue coumain - no symptoms, continue current meds - EKG shows rate controlled afib  3. HTN -essentialyl at goal, continue current meds  4. Hyperlipidemia  - LDL has been at goal, continue current statin    F/u 6 months  Arnoldo Lenis, M.D.

## 2020-01-25 NOTE — Patient Instructions (Signed)
Continue 1/2 tablet daily except 1 tablet on Mondays and Fridays.  Recheck in 6 weeks. 

## 2020-01-25 NOTE — Patient Instructions (Addendum)

## 2020-01-28 IMAGING — DX DG SHOULDER 2+V*R*
2 series · 2 of 2 positions shown · non-contrast
Comparison: None.

CLINICAL DATA: Status post fall with right shoulder pain.

EXAM:
RIGHT SHOULDER - 2+ VIEW

[shoulder grashey]
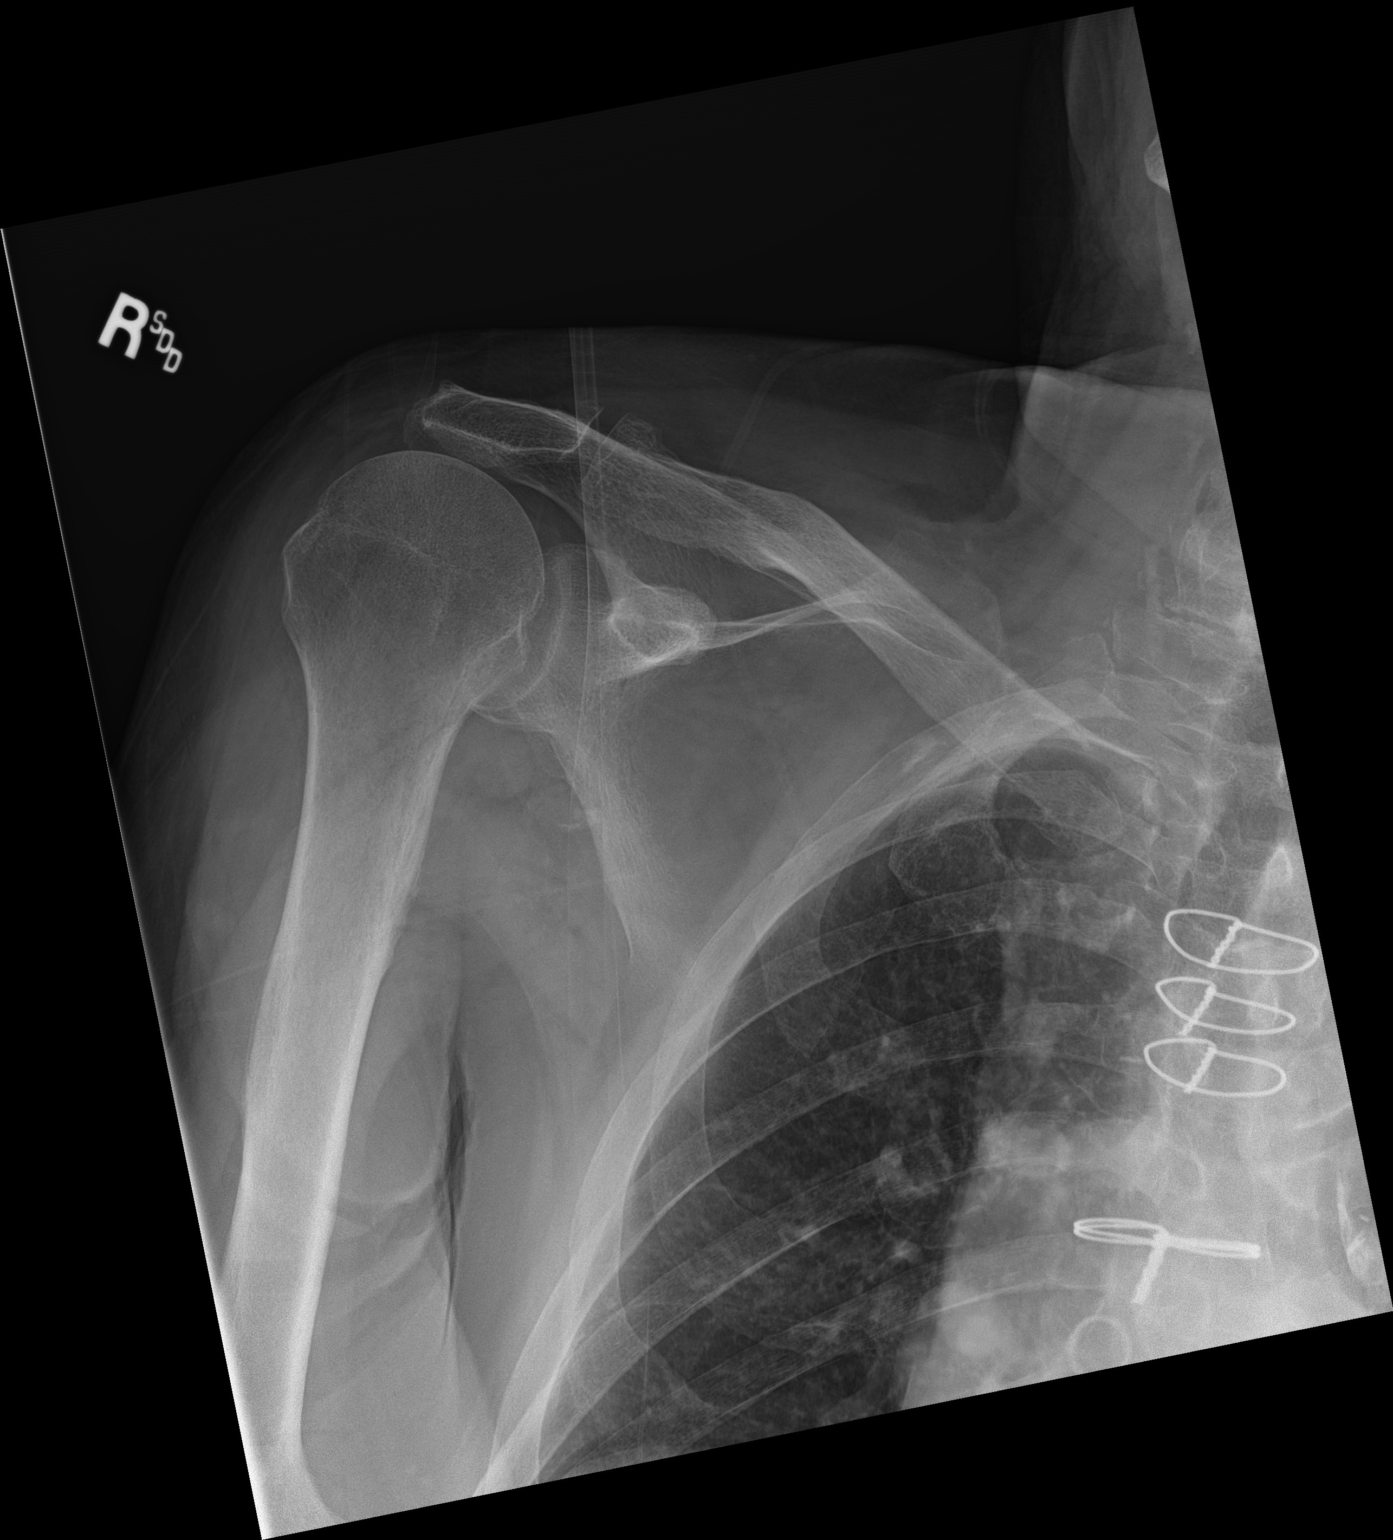

[shoulder y view]
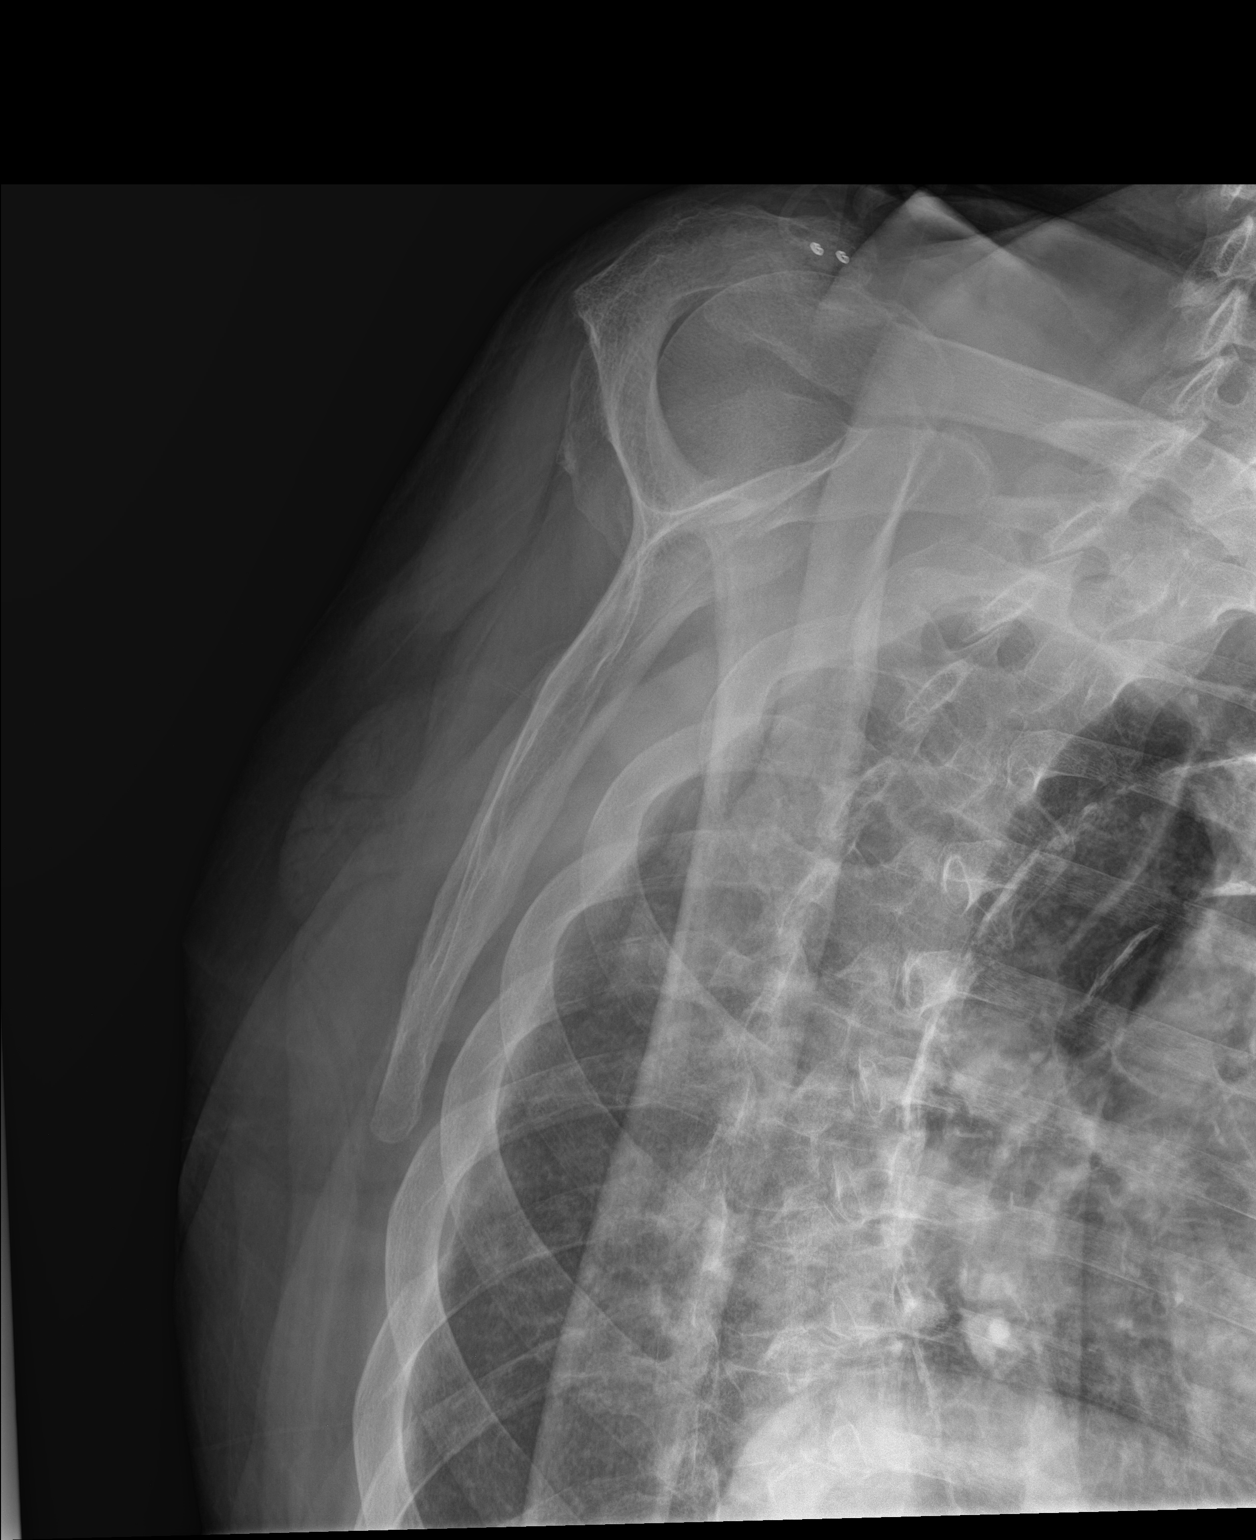

[2 of 2 positions shown; findings below may reference images not displayed]

FINDINGS: There is no evidence of fracture or dislocation. Soft tissues are
unremarkable.
IMPRESSION: No acute fracture or dislocation noted.

## 2020-02-21 ENCOUNTER — Ambulatory Visit: Payer: Medicare PPO | Admitting: Cardiology

## 2020-03-07 ENCOUNTER — Other Ambulatory Visit: Payer: Self-pay

## 2020-03-07 ENCOUNTER — Ambulatory Visit (INDEPENDENT_AMBULATORY_CARE_PROVIDER_SITE_OTHER): Payer: Medicare PPO | Admitting: Pharmacist

## 2020-03-07 DIAGNOSIS — Z5181 Encounter for therapeutic drug level monitoring: Secondary | ICD-10-CM | POA: Diagnosis not present

## 2020-03-07 DIAGNOSIS — I4891 Unspecified atrial fibrillation: Secondary | ICD-10-CM

## 2020-03-07 LAB — POCT INR: INR: 2.8 (ref 2.0–3.0)

## 2020-03-07 NOTE — Patient Instructions (Signed)
Description   Continue 1/2 tablet daily except 1 tablet on Mondays and Fridays Recheck in 6 weeks      

## 2020-04-18 ENCOUNTER — Other Ambulatory Visit: Payer: Self-pay | Admitting: Cardiology

## 2020-04-19 ENCOUNTER — Other Ambulatory Visit: Payer: Self-pay

## 2020-04-19 ENCOUNTER — Ambulatory Visit (INDEPENDENT_AMBULATORY_CARE_PROVIDER_SITE_OTHER): Payer: Medicare PPO | Admitting: Pharmacist

## 2020-04-19 DIAGNOSIS — Z5181 Encounter for therapeutic drug level monitoring: Secondary | ICD-10-CM | POA: Diagnosis not present

## 2020-04-19 DIAGNOSIS — I4891 Unspecified atrial fibrillation: Secondary | ICD-10-CM | POA: Diagnosis not present

## 2020-04-19 LAB — POCT INR: INR: 3.7 — AB (ref 2.0–3.0)

## 2020-04-19 NOTE — Patient Instructions (Signed)
Description   Hold warfarin for 2 days and then continue 1/2 tablet daily except 1 tablet on Mondays and Fridays.  Recheck in 3 weeks.

## 2020-05-08 ENCOUNTER — Encounter: Payer: Self-pay | Admitting: Family Medicine

## 2020-05-08 ENCOUNTER — Ambulatory Visit (INDEPENDENT_AMBULATORY_CARE_PROVIDER_SITE_OTHER): Payer: Medicare PPO | Admitting: Family Medicine

## 2020-05-08 ENCOUNTER — Other Ambulatory Visit: Payer: Self-pay

## 2020-05-08 VITALS — BP 120/78 | HR 53 | Temp 98.4°F | Ht 69.0 in | Wt 234.2 lb

## 2020-05-08 DIAGNOSIS — Z7901 Long term (current) use of anticoagulants: Secondary | ICD-10-CM

## 2020-05-08 DIAGNOSIS — Z23 Encounter for immunization: Secondary | ICD-10-CM

## 2020-05-08 DIAGNOSIS — Z0000 Encounter for general adult medical examination without abnormal findings: Secondary | ICD-10-CM | POA: Diagnosis not present

## 2020-05-08 DIAGNOSIS — E7849 Other hyperlipidemia: Secondary | ICD-10-CM

## 2020-05-08 DIAGNOSIS — I4891 Unspecified atrial fibrillation: Secondary | ICD-10-CM | POA: Diagnosis not present

## 2020-05-08 DIAGNOSIS — I10 Essential (primary) hypertension: Secondary | ICD-10-CM

## 2020-05-08 NOTE — Progress Notes (Signed)
Subjective:    Patient ID: [name], male    DOB: January 30, 1931, 86 y.o.   MRN: [id number]  HPI AWV- Annual Wellness Visit  The patient was seen for their annual wellness visit. The patient's past medical history, surgical history, and family history were reviewed. Pertinent vaccines were reviewed ( tetanus, pneumonia, shingles, flu) The patient's medication list was reviewed and updated.  The height and weight were entered.  BMI recorded in electronic record elsewhere  Cognitive screening was completed. Outcome of Mini - Cog: Pass   Falls /depression screening electronically recorded within record elsewhere  Current tobacco usage:none (All patients who use tobacco were given written and verbal information on quitting)  Recent listing of emergency department/hospitalizations over the past year were reviewed.  current specialist the patient sees on a regular basis: Dr.Branch   Medicare annual wellness visit patient questionnaire was reviewed.  A written screening schedule for the patient for the next 5-10 years was given. Appropriate discussion of followup regarding next visit was discussed.   Extremely nice patient here today for annual wellness visit immunizations were reviewed labs from that recently done by cardiology reviewed his health issues are being followed by cardiologist   Review of Systems  Constitutional: Negative for diaphoresis and fatigue.  HENT: Negative for congestion and rhinorrhea.   Respiratory: Negative for cough and shortness of breath.   Cardiovascular: Negative for chest pain and leg swelling.  Gastrointestinal: Negative for abdominal pain and diarrhea.  Skin: Negative for color change and rash.  Neurological: Negative for dizziness and headaches.  Psychiatric/Behavioral: Negative for behavioral problems and confusion.       Objective:   Physical Exam Vitals reviewed.  Constitutional:      General: He is not in acute distress. HENT:      Head: Normocephalic and atraumatic.  Eyes:     General:        Right eye: No discharge.        Left eye: No discharge.  Neck:     Trachea: No tracheal deviation.  Cardiovascular:     Rate and Rhythm: Normal rate.     Heart sounds: Normal heart sounds. No murmur heard.   Pulmonary:     Effort: Pulmonary effort is normal. No respiratory distress.     Breath sounds: Normal breath sounds.  Lymphadenopathy:     Cervical: No cervical adenopathy.  Skin:    General: Skin is warm and dry.  Neurological:     Mental Status: He is alert.     Coordination: Coordination normal.  Psychiatric:        Behavior: Behavior normal.           Assessment & Plan:  1. Encounter for subsequent annual wellness visit (AWV) in Medicare patient Adult wellness-complete.wellness physical was conducted today. Importance of diet and exercise were discussed in detail.  In addition to this a discussion regarding safety was also covered. We also reviewed over immunizations and gave recommendations regarding current immunization needed for age.  In addition to this additional areas were also touched on including: Preventative health exams needed:  Colonoscopy not indicated  Patient was advised yearly wellness exam  - Lipid Profile - Hepatic function panel - Basic Metabolic Panel (BMET) - CBC with Differential  2. Essential hypertension Blood pressure good control continue current measures watch salt in diet - Lipid Profile - Hepatic function panel - Basic Metabolic Panel (BMET) - CBC with Differential  3. Other hyperlipidemia Cholesterol followed by cardiology NS  lab work indicated await the results - Lipid Profile - Hepatic function panel - Basic Metabolic Panel (BMET) - CBC with Differential  4. Chronic anticoagulation Check CBC make sure patient not getting anemic is been over a year since he has had 1 - Lipid Profile - Hepatic function panel - Basic Metabolic Panel (BMET) - CBC with  Differential  5. Need for vaccination Flu shot today senior dose - Flu Vaccine QUAD High Dose(Fluad)  6. Morbid obesity (HCC) Portion control regular activity recommended difficult for patient to control weight morbid obesity is chosen associated with his heart disease atrial fibrillation hypertension hyperlipidemia  7. Atrial fibrillation, unspecified type (HCC) Good control currently followed by cardiology on a yearly basis along with Coumadin checks on a regular basis

## 2020-05-08 NOTE — Patient Instructions (Addendum)
Thank you for coming for your annual wellness visit.  Please follow through on any advice that was given to you by today's visit. Remember to maintain compliance with your medications as discussed today.  Also remember it is important to eat a healthy diet and to stay physically active on a daily basis.  Please follow through with any testing or recommended followup office visits as was discussed today. You are due the following test coming up:             Vaccines-flu shot yearly

## 2020-05-09 LAB — BASIC METABOLIC PANEL
BUN/Creatinine Ratio: 17 (ref 10–24)
BUN: 17 mg/dL (ref 8–27)
CO2: 23 mmol/L (ref 20–29)
Calcium: 9.7 mg/dL (ref 8.6–10.2)
Chloride: 90 mmol/L — ABNORMAL LOW (ref 96–106)
Creatinine, Ser: 0.99 mg/dL (ref 0.76–1.27)
GFR calc Af Amer: 79 mL/min/{1.73_m2} (ref >59)
GFR calc non Af Amer: 68 mL/min/{1.73_m2} (ref >59)
Glucose: 104 mg/dL — ABNORMAL HIGH (ref 65–99)
Potassium: 5.4 mmol/L — ABNORMAL HIGH (ref 3.5–5.2)
Sodium: 148 mmol/L — ABNORMAL HIGH (ref 134–144)

## 2020-05-09 LAB — CBC WITH DIFFERENTIAL/PLATELET
Basophils Absolute: 0.1 10*3/uL (ref 0.0–0.2)
Basos: 1 %
EOS (ABSOLUTE): 0.3 10*3/uL (ref 0.0–0.4)
Eos: 4 %
Hematocrit: 46.1 % (ref 37.5–51.0)
Hemoglobin: 15.8 g/dL (ref 13.0–17.7)
Immature Grans (Abs): 0 10*3/uL (ref 0.0–0.1)
Immature Granulocytes: 0 %
Lymphocytes Absolute: 1.7 10*3/uL (ref 0.7–3.1)
Lymphs: 20 %
MCH: 33.7 pg — ABNORMAL HIGH (ref 26.6–33.0)
MCHC: 34.3 g/dL (ref 31.5–35.7)
MCV: 98 fL — ABNORMAL HIGH (ref 79–97)
Monocytes Absolute: 0.9 10*3/uL (ref 0.1–0.9)
Monocytes: 11 %
Neutrophils Absolute: 5.5 10*3/uL (ref 1.4–7.0)
Neutrophils: 64 %
Platelets: 240 10*3/uL (ref 150–450)
RBC: 4.69 x10E6/uL (ref 4.14–5.80)
RDW: 13.4 % (ref 11.6–15.4)
WBC: 8.5 10*3/uL (ref 3.4–10.8)

## 2020-05-09 LAB — LIPID PANEL
Chol/HDL Ratio: 2.5 ratio (ref 0.0–5.0)
Cholesterol, Total: 135 mg/dL (ref 100–199)
HDL: 55 mg/dL (ref >39)
LDL Chol Calc (NIH): 61 mg/dL (ref 0–99)
Triglycerides: 104 mg/dL (ref 0–149)
VLDL Cholesterol Cal: 19 mg/dL (ref 5–40)

## 2020-05-09 LAB — HEPATIC FUNCTION PANEL
ALT: 18 IU/L (ref 0–44)
AST: 20 IU/L (ref 0–40)
Albumin: 4.4 g/dL (ref 3.6–4.6)
Alkaline Phosphatase: 152 IU/L — ABNORMAL HIGH (ref 44–121)
Bilirubin Total: 0.6 mg/dL (ref 0.0–1.2)
Bilirubin, Direct: 0.21 mg/dL (ref 0.00–0.40)
Total Protein: 7.3 g/dL (ref 6.0–8.5)

## 2020-05-14 ENCOUNTER — Ambulatory Visit (INDEPENDENT_AMBULATORY_CARE_PROVIDER_SITE_OTHER): Payer: Medicare PPO | Admitting: *Deleted

## 2020-05-14 DIAGNOSIS — I4891 Unspecified atrial fibrillation: Secondary | ICD-10-CM

## 2020-05-14 DIAGNOSIS — Z5181 Encounter for therapeutic drug level monitoring: Secondary | ICD-10-CM | POA: Diagnosis not present

## 2020-05-14 LAB — POCT INR: INR: 4.3 — AB (ref 2.0–3.0)

## 2020-05-14 NOTE — Patient Instructions (Signed)
Hold warfarin for 2 days then decrease dose to 1/2 tablet daily   Recheck in 3 weeks.

## 2020-05-21 ENCOUNTER — Other Ambulatory Visit: Payer: Self-pay | Admitting: Cardiology

## 2020-05-29 ENCOUNTER — Other Ambulatory Visit: Payer: Self-pay | Admitting: *Deleted

## 2020-05-29 DIAGNOSIS — Z79899 Other long term (current) drug therapy: Secondary | ICD-10-CM

## 2020-06-04 ENCOUNTER — Ambulatory Visit (INDEPENDENT_AMBULATORY_CARE_PROVIDER_SITE_OTHER): Payer: Medicare PPO | Admitting: *Deleted

## 2020-06-04 DIAGNOSIS — I4891 Unspecified atrial fibrillation: Secondary | ICD-10-CM | POA: Diagnosis not present

## 2020-06-04 DIAGNOSIS — Z5181 Encounter for therapeutic drug level monitoring: Secondary | ICD-10-CM

## 2020-06-04 LAB — POCT INR: INR: 2.5 (ref 2.0–3.0)

## 2020-06-04 NOTE — Patient Instructions (Signed)
Continue warfarin 1/2 tablet daily Recheck in 4 weeks 

## 2020-06-19 DIAGNOSIS — Z79899 Other long term (current) drug therapy: Secondary | ICD-10-CM | POA: Diagnosis not present

## 2020-06-20 ENCOUNTER — Encounter: Payer: Self-pay | Admitting: Family Medicine

## 2020-06-20 LAB — BASIC METABOLIC PANEL
BUN/Creatinine Ratio: 17 (ref 10–24)
BUN: 17 mg/dL (ref 8–27)
CO2: 23 mmol/L (ref 20–29)
Calcium: 9.1 mg/dL (ref 8.6–10.2)
Chloride: 103 mmol/L (ref 96–106)
Creatinine, Ser: 0.98 mg/dL (ref 0.76–1.27)
GFR calc Af Amer: 80 mL/min/{1.73_m2} (ref >59)
GFR calc non Af Amer: 69 mL/min/{1.73_m2} (ref >59)
Glucose: 126 mg/dL — ABNORMAL HIGH (ref 65–99)
Potassium: 4.1 mmol/L (ref 3.5–5.2)
Sodium: 142 mmol/L (ref 134–144)

## 2020-07-09 ENCOUNTER — Ambulatory Visit (INDEPENDENT_AMBULATORY_CARE_PROVIDER_SITE_OTHER): Payer: Medicare PPO | Admitting: *Deleted

## 2020-07-09 DIAGNOSIS — Z5181 Encounter for therapeutic drug level monitoring: Secondary | ICD-10-CM | POA: Diagnosis not present

## 2020-07-09 DIAGNOSIS — I4891 Unspecified atrial fibrillation: Secondary | ICD-10-CM

## 2020-07-09 LAB — POCT INR: INR: 2 (ref 2.0–3.0)

## 2020-07-09 NOTE — Patient Instructions (Signed)
Continue warfarin 1/2 tablet daily Recheck in 4 weeks 

## 2020-07-20 ENCOUNTER — Other Ambulatory Visit: Payer: Self-pay | Admitting: Cardiology

## 2020-07-23 ENCOUNTER — Other Ambulatory Visit: Payer: Self-pay | Admitting: *Deleted

## 2020-07-23 MED ORDER — WARFARIN SODIUM 5 MG PO TABS: ORAL_TABLET | ORAL | 6 refills | Status: DC

## 2020-07-31 ENCOUNTER — Ambulatory Visit (INDEPENDENT_AMBULATORY_CARE_PROVIDER_SITE_OTHER): Payer: Medicare PPO | Admitting: Cardiology

## 2020-07-31 ENCOUNTER — Encounter: Payer: Self-pay | Admitting: Cardiology

## 2020-07-31 VITALS — BP 134/66 | HR 60 | Ht 69.0 in | Wt 227.4 lb

## 2020-07-31 DIAGNOSIS — I2510 Atherosclerotic heart disease of native coronary artery without angina pectoris: Secondary | ICD-10-CM

## 2020-07-31 DIAGNOSIS — I4891 Unspecified atrial fibrillation: Secondary | ICD-10-CM

## 2020-07-31 DIAGNOSIS — E782 Mixed hyperlipidemia: Secondary | ICD-10-CM | POA: Diagnosis not present

## 2020-07-31 DIAGNOSIS — I10 Essential (primary) hypertension: Secondary | ICD-10-CM

## 2020-07-31 NOTE — Patient Instructions (Signed)

## 2020-07-31 NOTE — Progress Notes (Signed)
Clinical Summary Mr. Bracco is a 87 y.o.male seen today for follow up of the following medical problems.  1. CAD/ICM  - prior CABG in Nov 2006 at [facility] - echo 01/2016 LVEF 45-50%, severe LVH, apical hypokinesis, indeterminate diastolic function due to afib.      - no chest pain, no SOB or DOE - compliant with meds   2. Afib  -Has not been interested in NOACs  -no palpitacions  3. HTN  - remains compliant with meds  4. HL   04/2020 TC 135 TG 104 HDL 55 LDL 61 Compliant with statin   IH:KVQQ passedwithin the last few years   Past Medical History:  Diagnosis Date  . Anticoagulant disorder ([place])   . ASCVD (arteriosclerotic cardiovascular disease)    CABG 11/06; MUGA 11/08 - 44%; 3/07 mildly decreased EF on echo  . Atrial fibrillation ([place])    Initially occurred following CABG; event recorder in 11/08 - no AF; recurred 08/2009  . CHF (congestive heart failure) ([place])   . Coronary artery disease   . History of kidney stones   . HOH (hard of hearing)   . Hyperlipidemia   . Hypertension   . Myocardial infarction ([place])   . Nephrolithiasis   . Overweight(278.02)      Allergies  Allergen Reactions  . Ibuprofen Rash and Other (See Comments)    Skin rash     Current Outpatient Medications  Medication Sig Dispense Refill  . amLODipine-benazepril (LOTREL) 10-40 MG capsule TAKE 1 CAPSULE BY MOUTH ONCE DAILY. 30 capsule 6  . atorvastatin (LIPITOR) 80 MG tablet TAKE 1 TABLET ONCE DAILY. 30 tablet 9  . furosemide (LASIX) 20 MG tablet Take 1 tablet (20 mg total) by mouth daily. 30 tablet 11  . metoprolol succinate (TOPROL-XL) 50 MG 24 hr tablet TAKE 1 TABLET ONCE A DAY IMMEDIATELY FOLLOWING A MEAL. 30 tablet 11  . warfarin (COUMADIN) 5 MG tablet Take 1/2 tablet daily or as directed 24 tablet 6   No current facility-administered medications for this visit.     Past Surgical History:  Procedure Laterality Date  . ANGIOPLASTY  1991  .  CATARACT EXTRACTION W/PHACO Right 02/22/2018   Procedure: CATARACT EXTRACTION PHACO AND INTRAOCULAR LENS PLACEMENT RIGHT EYE CDE=16.62;  Surgeon: Tonny Willie Plain, MD;  Location: [facility];  Service: Ophthalmology;  Laterality: Right;  right  . CATARACT EXTRACTION W/PHACO Left 03/29/2018   Procedure: CATARACT EXTRACTION PHACO AND INTRAOCULAR LENS PLACEMENT (IOC);  Surgeon: Tonny Mads Borgmeyer, MD;  Location: [facility];  Service: Ophthalmology;  Laterality: Left;  CDE: 16.37  . COLONOSCOPY  2008  . CORONARY ARTERY BYPASS GRAFT  04/2005     Allergies  Allergen Reactions  . Ibuprofen Rash and Other (See Comments)    Skin rash      Family History  Problem Relation Age of Onset  . Hypertension Father   . Heart attack Father   . Diabetes Brother      Social History Mr. Kosman reports that he has never smoked. He has never used smokeless tobacco. Mr. Rushing reports no history of alcohol use.   Review of Systems CONSTITUTIONAL: No weight loss, fever, chills, weakness or fatigue.  HEENT: Eyes: No visual loss, blurred vision, double vision or yellow sclerae.No hearing loss, sneezing, congestion, runny nose or sore throat.  SKIN: No rash or itching.  CARDIOVASCULAR: per hpi RESPIRATORY: No shortness of breath, cough or sputum.  GASTROINTESTINAL: No anorexia, nausea, vomiting or diarrhea. No abdominal pain or  blood.  GENITOURINARY: No burning on urination, no polyuria NEUROLOGICAL: No headache, dizziness, syncope, paralysis, ataxia, numbness or tingling in the extremities. No change in bowel or bladder control.  MUSCULOSKELETAL: No muscle, back pain, joint pain or stiffness.  LYMPHATICS: No enlarged nodes. No history of splenectomy.  PSYCHIATRIC: No history of depression or anxiety.  ENDOCRINOLOGIC: No reports of sweating, cold or heat intolerance. No polyuria or polydipsia.  [name]   Physical Examination Today's Vitals   07/31/20 0927  BP: 134/66  Pulse: 60  SpO2: 97%  Weight: 227 lb 6.4 oz (103.1 kg)   Height: _0  (1.753 m)   Body mass index is 33.58 kg/m.  Gen: resting comfortably, no acute distress HEENT: no scleral icterus, pupils equal round and reactive, no palptable cervical adenopathy,  CV: RRR, no m/r/g, no jvd Resp: Clear to auscultation bilaterally GI: abdomen is soft, non-tender, non-distended, normal bowel sounds, no hepatosplenomegaly MSK: extremities are warm, no edema.  Skin: warm, no rash Neuro:  no focal deficits Psych: appropriate affect   Diagnostic Studies  July 2011 Echo: mild to moderately decreased LVEF (no LVEF given), apex akinetic, severe LVH, severe LAE   05/05/13 Clinic EKG: afib at 58 bpm, LAD, anteroseptal and anterolateral Q waves, inferior Q waves   05/2013 Echo Technically difficult study, severe LVH, elevated LA pressure. Difficult to evalute LV function even with contrast, appears low normal to normal.   01/2016 echo Study Conclusions  - Left ventricle: The cavity size was normal. Wall thickness was increased in a pattern of severe LVH. Systolic function was mildly reduced. The estimated ejection fraction was in the range of 45% to 50%. There is hypokinesis of the mid-apicalanteroseptal and apical myocardium. - Aortic valve: Trileaflet; moderately calcified leaflets. There was trivial regurgitation. - Aortic root: The aortic root was mildly dilated. - Mitral valve: Calcified annulus. There was trivial regurgitation. - Left atrium: The atrium was moderately dilated. - Right atrium: The atrium was mildly dilated. Central venous pressure (est): 8 mm Hg. - Tricuspid valve: There was trivial regurgitation. - Pulmonary arteries: Systolic pressure could not be accurately estimated. - Pericardium, extracardiac: There was no pericardial effusion.  Impressions:  - Severe LVH with LVEF approximately 45-50%. There is hypokinesis of the mid to apical anteroseptal wall and apex, no obvious LV mural thrombus with  Definity contrast. Indeterminate diastolic function in the setting of atrial fibrillation. Moderate left atrial enlargement. MAC with trivial mitral regurgitation. Mildly dilated aortic root with sclerotic aortic valve and trivial aortic stenosis. Mildly dilated right atrium. Trivial tricuspid regurgitation.     Assessment and Plan  1. CAD/ICM -denies symptoms, continue current meds  2. Afib  - has not been interested in NOACs, he will continue coumadin - no symptoms, continue current meds  3. HTN - at goal, continue current meds  4. Hyperlipidemia  - LDL remains at goal, continue statin  F/u 6 months      Arnoldo Lenis, M.D.

## 2020-08-13 ENCOUNTER — Ambulatory Visit (INDEPENDENT_AMBULATORY_CARE_PROVIDER_SITE_OTHER): Payer: Medicare PPO | Admitting: *Deleted

## 2020-08-13 DIAGNOSIS — Z5181 Encounter for therapeutic drug level monitoring: Secondary | ICD-10-CM | POA: Diagnosis not present

## 2020-08-13 DIAGNOSIS — I4891 Unspecified atrial fibrillation: Secondary | ICD-10-CM | POA: Diagnosis not present

## 2020-08-13 LAB — POCT INR: INR: 2 (ref 2.0–3.0)

## 2020-08-13 NOTE — Patient Instructions (Signed)
Continue warfarin 1/2 tablet daily  Recheck in 6 weeks  

## 2020-08-20 ENCOUNTER — Other Ambulatory Visit: Payer: Self-pay | Admitting: Cardiology

## 2020-09-24 ENCOUNTER — Other Ambulatory Visit: Payer: Self-pay

## 2020-09-24 ENCOUNTER — Ambulatory Visit (INDEPENDENT_AMBULATORY_CARE_PROVIDER_SITE_OTHER): Payer: Medicare PPO | Admitting: *Deleted

## 2020-09-24 DIAGNOSIS — I4891 Unspecified atrial fibrillation: Secondary | ICD-10-CM

## 2020-09-24 DIAGNOSIS — Z5181 Encounter for therapeutic drug level monitoring: Secondary | ICD-10-CM

## 2020-09-24 LAB — POCT INR: INR: 1.9 — AB (ref 2.0–3.0)

## 2020-09-24 NOTE — Patient Instructions (Signed)
Increase warfarin to 1/2 tablet daily except 1 tablet on Mondays  Recheck in 6 weeks.

## 2020-11-05 ENCOUNTER — Ambulatory Visit (INDEPENDENT_AMBULATORY_CARE_PROVIDER_SITE_OTHER): Payer: Medicare PPO | Admitting: *Deleted

## 2020-11-05 DIAGNOSIS — Z5181 Encounter for therapeutic drug level monitoring: Secondary | ICD-10-CM

## 2020-11-05 DIAGNOSIS — I4891 Unspecified atrial fibrillation: Secondary | ICD-10-CM

## 2020-11-05 LAB — POCT INR: INR: 1.8 — AB (ref 2.0–3.0)

## 2020-11-05 NOTE — Patient Instructions (Signed)
Increase warfarin to 1/2 tablet daily except 1 tablet on Mondays and Thursdays  Recheck in 3 weeks.

## 2020-12-04 ENCOUNTER — Ambulatory Visit (INDEPENDENT_AMBULATORY_CARE_PROVIDER_SITE_OTHER): Payer: Medicare PPO | Admitting: *Deleted

## 2020-12-04 DIAGNOSIS — I4891 Unspecified atrial fibrillation: Secondary | ICD-10-CM | POA: Diagnosis not present

## 2020-12-04 DIAGNOSIS — Z5181 Encounter for therapeutic drug level monitoring: Secondary | ICD-10-CM

## 2020-12-04 LAB — POCT INR: INR: 2.6 (ref 2.0–3.0)

## 2020-12-04 NOTE — Patient Instructions (Signed)
Continue warfarin 1/2 tablet daily except 1 tablet on Mondays and Thursdays  Recheck in 4 weeks 

## 2020-12-17 ENCOUNTER — Other Ambulatory Visit: Payer: Self-pay | Admitting: Cardiology

## 2021-01-04 ENCOUNTER — Ambulatory Visit (INDEPENDENT_AMBULATORY_CARE_PROVIDER_SITE_OTHER): Payer: Medicare PPO | Admitting: *Deleted

## 2021-01-04 ENCOUNTER — Other Ambulatory Visit: Payer: Self-pay

## 2021-01-04 DIAGNOSIS — Z5181 Encounter for therapeutic drug level monitoring: Secondary | ICD-10-CM

## 2021-01-04 DIAGNOSIS — I4891 Unspecified atrial fibrillation: Secondary | ICD-10-CM

## 2021-01-04 LAB — POCT INR: INR: 2.5 (ref 2.0–3.0)

## 2021-01-04 NOTE — Patient Instructions (Signed)
Continue warfarin 1/2 tablet daily except 1 tablet on Mondays and Thursdays °Recheck in 6 weeks °

## 2021-01-16 ENCOUNTER — Ambulatory Visit (INDEPENDENT_AMBULATORY_CARE_PROVIDER_SITE_OTHER): Payer: Medicare PPO | Admitting: Cardiology

## 2021-01-16 VITALS — BP 140/68 | HR 56 | Ht 69.0 in | Wt 224.8 lb

## 2021-01-16 DIAGNOSIS — I255 Ischemic cardiomyopathy: Secondary | ICD-10-CM | POA: Diagnosis not present

## 2021-01-16 DIAGNOSIS — I10 Essential (primary) hypertension: Secondary | ICD-10-CM

## 2021-01-16 DIAGNOSIS — I4891 Unspecified atrial fibrillation: Secondary | ICD-10-CM | POA: Diagnosis not present

## 2021-01-16 DIAGNOSIS — I2510 Atherosclerotic heart disease of native coronary artery without angina pectoris: Secondary | ICD-10-CM

## 2021-01-16 NOTE — Progress Notes (Signed)
Clinical Summary Mr. Helbing is a 87 y.o.male seen today for follow up of the following medical problems.    1. CAD/ICM   - prior CABG in Nov 2006 at [facility]  - echo 01/2016 LVEF 45-50%, severe LVH, apical hypokinesis, indeterminate diastolic function due to afib.       -no recent chest pain. No SOB/DOE - compliant with meds     2. Afib   - Has not been interested in NOACs   -no palpitations, no bleeding on coumadin   3. HTN   - compliant with meds   4. HL      04/2020 TC 135 TG 104 HDL 55 LDL 61 Labs followed by pcp - he is on statin     SH: wife passed within the last few years   Past Medical History:  Diagnosis Date   Anticoagulant disorder ([place])    ASCVD (arteriosclerotic cardiovascular disease)    CABG 11/06; MUGA 11/08 - 44%; 3/07 mildly decreased EF on echo   Atrial fibrillation ([place])    Initially occurred following CABG; event recorder in 11/08 - no AF; recurred 08/2009   CHF (congestive heart failure) (HCC)    Coronary artery disease    History of kidney stones    HOH (hard of hearing)    Hyperlipidemia    Hypertension    Myocardial infarction ([place])    Nephrolithiasis    Overweight(278.02)      Allergies  Allergen Reactions   Ibuprofen Rash and Other (See Comments)    Skin rash     Current Outpatient Medications  Medication Sig Dispense Refill   amLODipine-benazepril (LOTREL) 10-40 MG capsule TAKE 1 CAPSULE BY MOUTH ONCE DAILY. 30 capsule 6   atorvastatin (LIPITOR) 80 MG tablet TAKE 1 TABLET ONCE DAILY. 90 tablet 3   furosemide (LASIX) 20 MG tablet TAKE 1 TABLET ONCE DAILY. 90 tablet 3   metoprolol succinate (TOPROL-XL) 50 MG 24 hr tablet TAKE 1 TABLET ONCE A DAY IMMEDIATELY FOLLOWING A MEAL. 30 tablet 11   warfarin (COUMADIN) 5 MG tablet Take 1/2 tablet daily or as directed 24 tablet 6   No current facility-administered medications for this visit.     Past Surgical History:  Procedure Laterality Date   ANGIOPLASTY  1991    CATARACT EXTRACTION W/PHACO Right 02/22/2018   Procedure: CATARACT EXTRACTION PHACO AND INTRAOCULAR LENS PLACEMENT RIGHT EYE CDE=16.62;  Surgeon: Tonny Gaylon Bentz, MD;  Location: [facility];  Service: Ophthalmology;  Laterality: Right;  right   CATARACT EXTRACTION W/PHACO Left 03/29/2018   Procedure: CATARACT EXTRACTION PHACO AND INTRAOCULAR LENS PLACEMENT (IOC);  Surgeon: Tonny Sebastiano Luecke, MD;  Location: [facility];  Service: Ophthalmology;  Laterality: Left;  CDE: 16.37   COLONOSCOPY  2008   CORONARY ARTERY BYPASS GRAFT  04/2005     Allergies  Allergen Reactions   Ibuprofen Rash and Other (See Comments)    Skin rash      Family History  Problem Relation Age of Onset   Hypertension Father    Heart attack Father    Diabetes Brother      Social History Mr. Neenan reports that he has never smoked. He has never used smokeless tobacco. Mr. Seabrooks reports no history of alcohol use.   Review of Systems CONSTITUTIONAL: No weight loss, fever, chills, weakness or fatigue.  HEENT: Eyes: No visual loss, blurred vision, double vision or yellow sclerae.No hearing loss, sneezing, congestion, runny nose or sore throat.  SKIN: No rash or itching.  CARDIOVASCULAR: per hpi RESPIRATORY: No shortness of breath, cough or sputum.  GASTROINTESTINAL: No anorexia, nausea, vomiting or diarrhea. No abdominal pain or blood.  GENITOURINARY: No burning on urination, no polyuria NEUROLOGICAL: No headache, dizziness, syncope, paralysis, ataxia, numbness or tingling in the extremities. No change in bowel or bladder control.  MUSCULOSKELETAL: No muscle, back pain, joint pain or stiffness.  LYMPHATICS: No enlarged nodes. No history of splenectomy.  PSYCHIATRIC: No history of depression or anxiety.  ENDOCRINOLOGIC: No reports of sweating, cold or heat intolerance. No polyuria or polydipsia.  [name]   Physical Examination Today's Vitals   01/16/21 1408  BP: 140/68  Pulse: (!) 56  SpO2: 98%  Weight: 224 lb 12.8 oz (102 kg)   Height: _0  (1.753 m)   Body mass index is 33.2 kg/m.  Gen: resting comfortably, no acute distress HEENT: no scleral icterus, pupils equal round and reactive, no palptable cervical adenopathy,  CV: irreg, no m/r/g, no jvd Resp: Clear to auscultation bilaterally GI: abdomen is soft, non-tender, non-distended, normal bowel sounds, no hepatosplenomegaly MSK: extremities are warm, no edema.  Skin: warm, no rash Neuro:  no focal deficits Psych: appropriate affect   Diagnostic Studies   July 2011 Echo: mild to moderately decreased LVEF (no LVEF given), apex akinetic, severe LVH, severe LAE     05/05/13 Clinic EKG: afib at 58 bpm, LAD, anteroseptal and anterolateral Q waves, inferior Q waves     05/2013 Echo   Technically difficult study, severe LVH, elevated LA pressure. Difficult to evalute LV function even with contrast, appears low normal to normal.     01/2016 echo Study Conclusions   - Left ventricle: The cavity size was normal. Wall thickness was   increased in a pattern of severe LVH. Systolic function was   mildly reduced. The estimated ejection fraction was in the range   of 45% to 50%. There is hypokinesis of the mid-apicalanteroseptal   and apical myocardium. - Aortic valve: Trileaflet; moderately calcified leaflets. There   was trivial regurgitation. - Aortic root: The aortic root was mildly dilated. - Mitral valve: Calcified annulus. There was trivial regurgitation. - Left atrium: The atrium was moderately dilated. - Right atrium: The atrium was mildly dilated. Central venous   pressure (est): 8 mm Hg. - Tricuspid valve: There was trivial regurgitation. - Pulmonary arteries: Systolic pressure could not be accurately   estimated. - Pericardium, extracardiac: There was no pericardial effusion.   Impressions:   - Severe LVH with LVEF approximately 45-50%. There is hypokinesis   of the mid to apical anteroseptal wall and apex, no obvious LV   mural thrombus with  Definity contrast. Indeterminate diastolic   function in the setting of atrial fibrillation. Moderate left   atrial enlargement. MAC with trivial mitral regurgitation. Mildly   dilated aortic root with sclerotic aortic valve and trivial   aortic stenosis. Mildly dilated right atrium. Trivial tricuspid   regurgitation.    Assessment and Plan  1. CAD/ICM --denies any symptoms, continue current meds   2. Afib   - has not been interested in NOACs - no symptoms, continue current meds   3. HTN  -reasonable control, continue current meds   4. Hyperlipidemia   - upcoming labs with pcp, continue atorvastatin 63m daily.       JArnoldo Lenis M.D

## 2021-01-16 NOTE — Patient Instructions (Signed)
Medication Instructions:  Continue all current medications.   Labwork: none  Testing/Procedures: none  Follow-Up: 6 months   Any Other Special Instructions Will Be Listed Below (If Applicable).   If you need a refill on your cardiac medications before your next appointment, please call your pharmacy.  

## 2021-01-18 ENCOUNTER — Encounter (HOSPITAL_COMMUNITY): Payer: Self-pay | Admitting: *Deleted

## 2021-01-18 ENCOUNTER — Emergency Department (HOSPITAL_COMMUNITY): Payer: Medicare PPO

## 2021-01-18 ENCOUNTER — Other Ambulatory Visit: Payer: Self-pay

## 2021-01-18 ENCOUNTER — Emergency Department (HOSPITAL_COMMUNITY)
Admission: EM | Admit: 2021-01-18 | Discharge: 2021-01-18 | Disposition: A | Discharge status: Home / Self Care | Payer: Medicare PPO | Attending: Emergency Medicine | Admitting: Emergency Medicine

## 2021-01-18 DIAGNOSIS — U071 COVID-19: Secondary | ICD-10-CM

## 2021-01-18 DIAGNOSIS — Z7901 Long term (current) use of anticoagulants: Secondary | ICD-10-CM | POA: Diagnosis not present

## 2021-01-18 DIAGNOSIS — R531 Weakness: Secondary | ICD-10-CM | POA: Diagnosis not present

## 2021-01-18 DIAGNOSIS — Z79899 Other long term (current) drug therapy: Secondary | ICD-10-CM | POA: Insufficient documentation

## 2021-01-18 DIAGNOSIS — I4891 Unspecified atrial fibrillation: Secondary | ICD-10-CM | POA: Diagnosis not present

## 2021-01-18 DIAGNOSIS — I509 Heart failure, unspecified: Secondary | ICD-10-CM | POA: Diagnosis not present

## 2021-01-18 DIAGNOSIS — I2510 Atherosclerotic heart disease of native coronary artery without angina pectoris: Secondary | ICD-10-CM | POA: Insufficient documentation

## 2021-01-18 DIAGNOSIS — J9811 Atelectasis: Secondary | ICD-10-CM | POA: Diagnosis not present

## 2021-01-18 DIAGNOSIS — I714 Abdominal aortic aneurysm, without rupture: Secondary | ICD-10-CM | POA: Diagnosis not present

## 2021-01-18 DIAGNOSIS — Z951 Presence of aortocoronary bypass graft: Secondary | ICD-10-CM | POA: Diagnosis not present

## 2021-01-18 DIAGNOSIS — I517 Cardiomegaly: Secondary | ICD-10-CM | POA: Diagnosis not present

## 2021-01-18 DIAGNOSIS — I110 Hypertensive heart disease with heart failure: Secondary | ICD-10-CM | POA: Diagnosis not present

## 2021-01-18 DIAGNOSIS — K8020 Calculus of gallbladder without cholecystitis without obstruction: Secondary | ICD-10-CM | POA: Insufficient documentation

## 2021-01-18 LAB — CBC WITH DIFFERENTIAL/PLATELET
Abs Immature Granulocytes: 0.02 10*3/uL (ref 0.00–0.07)
Basophils Absolute: 0.1 10*3/uL (ref 0.0–0.1)
Basophils Relative: 1 %
Eosinophils Absolute: 0 10*3/uL (ref 0.0–0.5)
Eosinophils Relative: 0 %
HCT: 43.4 % (ref 39.0–52.0)
Hemoglobin: 15 g/dL (ref 13.0–17.0)
Immature Granulocytes: 0 %
Lymphocytes Relative: 6 %
Lymphs Abs: 0.6 10*3/uL — ABNORMAL LOW (ref 0.7–4.0)
MCH: 34.7 pg — ABNORMAL HIGH (ref 26.0–34.0)
MCHC: 34.6 g/dL (ref 30.0–36.0)
MCV: 100.5 fL — ABNORMAL HIGH (ref 80.0–100.0)
Monocytes Absolute: 0.8 10*3/uL (ref 0.1–1.0)
Monocytes Relative: 8 %
Neutro Abs: 9 10*3/uL — ABNORMAL HIGH (ref 1.7–7.7)
Neutrophils Relative %: 85 %
Platelets: 174 10*3/uL (ref 150–400)
RBC: 4.32 MIL/uL (ref 4.22–5.81)
RDW: 13.8 % (ref 11.5–15.5)
WBC: 10.6 10*3/uL — ABNORMAL HIGH (ref 4.0–10.5)
nRBC: 0 % (ref 0.0–0.2)

## 2021-01-18 LAB — COMPREHENSIVE METABOLIC PANEL
ALT: 14 U/L (ref 0–44)
AST: 18 U/L (ref 15–41)
Albumin: 3.7 g/dL (ref 3.5–5.0)
Alkaline Phosphatase: 86 U/L (ref 38–126)
Anion gap: 8 (ref 5–15)
BUN: 21 mg/dL (ref 8–23)
CO2: 25 mmol/L (ref 22–32)
Calcium: 8.5 mg/dL — ABNORMAL LOW (ref 8.9–10.3)
Chloride: 102 mmol/L (ref 98–111)
Creatinine, Ser: 0.93 mg/dL (ref 0.61–1.24)
GFR, Estimated: >60 mL/min (ref >60)
Glucose, Bld: 109 mg/dL — ABNORMAL HIGH (ref 70–99)
Potassium: 3.8 mmol/L (ref 3.5–5.1)
Sodium: 135 mmol/L (ref 135–145)
Total Bilirubin: 1.4 mg/dL — ABNORMAL HIGH (ref 0.3–1.2)
Total Protein: 7.1 g/dL (ref 6.5–8.1)

## 2021-01-18 LAB — BLOOD GAS, VENOUS
Acid-Base Excess: 1.2 mmol/L (ref 0.0–2.0)
Bicarbonate: 25.1 mmol/L (ref 20.0–28.0)
FIO2: 21
O2 Saturation: 59.9 %
Patient temperature: 37.2
pCO2, Ven: 34.5 mmHg — ABNORMAL LOW (ref 44.0–60.0)
pH, Ven: 7.467 — ABNORMAL HIGH (ref 7.250–7.430)
pO2, Ven: 31.2 mmHg — CL (ref 32.0–45.0)

## 2021-01-18 LAB — RESP PANEL BY RT-PCR (FLU A&B, COVID) ARPGX2
Influenza A by PCR: NEGATIVE
Influenza B by PCR: NEGATIVE
SARS Coronavirus 2 by RT PCR: POSITIVE — AB

## 2021-01-18 LAB — PROTIME-INR
INR: 1.7 — ABNORMAL HIGH (ref 0.8–1.2)
Prothrombin Time: 20.3 seconds — ABNORMAL HIGH (ref 11.4–15.2)

## 2021-01-18 LAB — LIPASE, BLOOD: Lipase: 31 U/L (ref 11–51)

## 2021-01-18 MED ORDER — SODIUM CHLORIDE 0.9 % IV BOLUS
500.0000 mL | Freq: Once | INTRAVENOUS | Status: AC
  Administered 2021-01-18: 500 mL via INTRAVENOUS

## 2021-01-18 MED ORDER — IOHEXOL 300 MG/ML  SOLN
100.0000 mL | Freq: Once | INTRAMUSCULAR | Status: AC | PRN
  Administered 2021-01-18: 100 mL via INTRAVENOUS

## 2021-01-18 MED ORDER — PANTOPRAZOLE SODIUM 40 MG IV SOLR
40.0000 mg | Freq: Once | INTRAVENOUS | Status: AC
  Administered 2021-01-18: 40 mg via INTRAVENOUS
  Filled 2021-01-18: qty 40

## 2021-01-18 MED ORDER — NIRMATRELVIR/RITONAVIR (PAXLOVID)TABLET
3.0000 | ORAL_TABLET | Freq: Two times a day (BID) | ORAL | Status: DC
Start: 2021-01-18 — End: 2021-01-18
  Administered 2021-01-18: 3 via ORAL
  Filled 2021-01-18: qty 30

## 2021-01-18 NOTE — ED Notes (Signed)
Date and time results received: 01/18/21 1228 (use smartphrase ".now" to insert current time)  Test: Venous Blood Gas PO2 Critical Value: 31.2  Name of Provider Notified: Effie Shy  Orders Received? Or Actions Taken?:

## 2021-01-18 NOTE — ED Notes (Signed)
Pt given meal

## 2021-01-18 NOTE — ED Triage Notes (Signed)
Pt brought in by [facility] from home with c/o weakness and diaphoresis since yesterday. CBG 123, BP 140/60, O2 sat 96% on RA, HR 64 for EMS.

## 2021-01-18 NOTE — ED Notes (Signed)
Pt had wet depend on upon arrival by ems; pt changed and new clean depend placed on pt

## 2021-01-18 NOTE — ED Notes (Signed)
Pt has had incontinence of bowel; pt cleaned and sitting in wheelchair waiting for ride

## 2021-01-18 NOTE — ED Provider Notes (Signed)
[facility] EMERGENCY DEPARTMENT Provider Note   CSN: [id number] Arrival date & time: 01/18/21  1114     History Chief Complaint  Patient presents with   Weakness    [name] is a 87 y.o. male.  HPI He presents by EMS for evaluation of weakness and diaphoresis. Patient acknowledged me when I came into the room, and told me what was bothering him.  He states that since 2 days ago after eating at [employer] for his birthday, he has had a sensation of fluid coming up into his mouth.  This is worse when he lies down.  He denies shortness of breath or chest pain.  When I asked him questions he was completely unable to hear me and therefore I could not elucidate further information from him.  Level 5 caveat-extremely hard of hearing.    Past Medical History:  Diagnosis Date   Anticoagulant disorder (HCC)    ASCVD (arteriosclerotic cardiovascular disease)    CABG 11/06; MUGA 11/08 - 44%; 3/07 mildly decreased EF on echo   Atrial fibrillation (HCC)    Initially occurred following CABG; event recorder in 11/08 - no AF; recurred 08/2009   CHF (congestive heart failure) (HCC)    Coronary artery disease    History of kidney stones    HOH (hard of hearing)    Hyperlipidemia    Hypertension    Myocardial infarction [facility])    Nephrolithiasis    Overweight(278.02)     Patient Active Problem List   Diagnosis Date Noted   Morbid obesity (HCC) 05/08/2020   Cardiomyopathy, ischemic 05/01/2016   Viral gastroenteritis 01/06/2015   Prediabetes 08/25/2014   Encounter for therapeutic drug monitoring 08/01/2013   Atrial fibrillation (HCC) 10/01/2010   Chronic anticoagulation 10/01/2010   Hyperlipidemia 12/18/2009   Overweight(278.02) 12/18/2009   NEPHROLITHIASIS 12/18/2009   Essential hypertension 08/07/2009    Past Surgical History:  Procedure Laterality Date   ANGIOPLASTY  1991   CATARACT EXTRACTION W/PHACO Right 02/22/2018   Procedure: CATARACT EXTRACTION PHACO AND  INTRAOCULAR LENS PLACEMENT RIGHT EYE CDE=16.62;  Surgeon: Gemma Payor, MD;  Location: [facility];  Service: Ophthalmology;  Laterality: Right;  right   CATARACT EXTRACTION W/PHACO Left 03/29/2018   Procedure: CATARACT EXTRACTION PHACO AND INTRAOCULAR LENS PLACEMENT (IOC);  Surgeon: Gemma Payor, MD;  Location: [facility];  Service: Ophthalmology;  Laterality: Left;  CDE: 16.37   COLONOSCOPY  2008   CORONARY ARTERY BYPASS GRAFT  04/2005       Family History  Problem Relation Age of Onset   Hypertension Father    Heart attack Father    Diabetes Brother     Social History   Tobacco Use   Smoking status: Never   Smokeless tobacco: Never  Vaping Use   Vaping Use: Never used  Substance Use Topics   Alcohol use: No   Drug use: No    Home Medications Prior to Admission medications   Medication Sig Start Date End Date Taking? Authorizing Provider  amLODipine-benazepril (LOTREL) 10-40 MG capsule TAKE 1 CAPSULE BY MOUTH ONCE DAILY. 04/18/20   Antoine Poche, MD  atorvastatin (LIPITOR) 80 MG tablet TAKE 1 TABLET ONCE DAILY. 12/17/20   Antoine Poche, MD  furosemide (LASIX) 20 MG tablet TAKE 1 TABLET ONCE DAILY. 12/17/20   Antoine Poche, MD  metoprolol succinate (TOPROL-XL) 50 MG 24 hr tablet TAKE 1 TABLET ONCE A DAY IMMEDIATELY FOLLOWING A MEAL. 05/21/20   Runell Gess, MD  warfarin (COUMADIN) 5  MG tablet Take 1/2 tablet daily or as directed 07/23/20   Antoine PocheBranch, Jonathan F, MD    Allergies    Ibuprofen  Review of Systems   Review of Systems  Unable to perform ROS: Other   Physical Exam Updated Vital Signs BP 135/70   Pulse (!) 57   Temp 99 F (37.2 C) (Oral)   Resp 17   Ht 5\' 9"  (1.753 m)   Wt 102 kg   SpO2 93%   BMI 33.20 kg/m   Physical Exam Vitals and nursing note reviewed.  Constitutional:      Appearance: He is well-developed. He is not ill-appearing.  HENT:     Head: Normocephalic and atraumatic.     Right Ear: External ear normal.     Left Ear: External  ear normal.     Nose: No congestion.  Eyes:     Conjunctiva/sclera: Conjunctivae normal.     Pupils: Pupils are equal, round, and reactive to light.  Neck:     Trachea: Phonation normal.  Cardiovascular:     Rate and Rhythm: Normal rate and regular rhythm.     Heart sounds: Normal heart sounds.  Pulmonary:     Effort: Pulmonary effort is normal.     Breath sounds: Normal breath sounds.  Abdominal:     General: There is no distension.     Palpations: Abdomen is soft. There is no mass.     Tenderness: There is no abdominal tenderness.     Hernia: No hernia is present.  Musculoskeletal:        General: Normal range of motion.     Cervical back: Normal range of motion and neck supple.  Skin:    General: Skin is warm and dry.  Neurological:     Mental Status: He is alert.     Cranial Nerves: No cranial nerve deficit.     Sensory: No sensory deficit.     Motor: No abnormal muscle tone.     Coordination: Coordination normal.  Psychiatric:        Mood and Affect: Mood normal.        Behavior: Behavior normal.    ED Results / Procedures / Treatments   Labs (all labs ordered are listed, but only abnormal results are displayed) Labs Reviewed  RESP PANEL BY RT-PCR (FLU A&B, COVID) ARPGX2 - Abnormal; Notable for the following components:      Result Value   SARS Coronavirus 2 by RT PCR POSITIVE (*)    All other components within normal limits  COMPREHENSIVE METABOLIC PANEL - Abnormal; Notable for the following components:   Glucose, Bld 109 (*)    Calcium 8.5 (*)    Total Bilirubin 1.4 (*)    All other components within normal limits  CBC WITH DIFFERENTIAL/PLATELET - Abnormal; Notable for the following components:   WBC 10.6 (*)    MCV 100.5 (*)    MCH 34.7 (*)    Neutro Abs 9.0 (*)    Lymphs Abs 0.6 (*)    All other components within normal limits  BLOOD GAS, VENOUS - Abnormal; Notable for the following components:   pH, Ven 7.467 (*)    pCO2, Ven 34.5 (*)    pO2, Ven  31.2 (*)    All other components within normal limits  PROTIME-INR - Abnormal; Notable for the following components:   Prothrombin Time 20.3 (*)    INR 1.7 (*)    All other components within normal limits  LIPASE, BLOOD  EKG EKG Interpretation  Date/Time:  Friday January 18 2021 11:38:00 EDT Ventricular Rate:  62 PR Interval:    QRS Duration: 128 QT Interval:  415 QTC Calculation: 422 R Axis:   -61 Text Interpretation: Atrial fibrillation Ventricular premature complex Nonspecific IVCD with LAD Anterolateral infarct, old Since last tracing pvc new Otherwise no significant change Confirmed by Mancel Bale [phone]) on 01/18/2021 1:46:19 PM  Radiology DG Chest 2 View  Result Date: 01/18/2021 CLINICAL DATA:  Reflux.  Weakness.  COVID positive 01/18/2021 EXAM: CHEST - 2 VIEW COMPARISON:  Chest radiograph 05/25/2017. Included lung bases from abdominal CT earlier performed concurrently. FINDINGS: Patient is rotated. Eventration of the right hemidiaphragm. Post median sternotomy. The heart is enlarged. Aortic atherosclerosis. Scattered atelectasis without confluent airspace disease. No pulmonary edema stage patient. L1 compression fracture was not previously included in the field of view. IMPRESSION: 1. Scattered atelectasis without confluent airspace disease. 2. Cardiomegaly post CABG.  Aortic atherosclerosis. Electronically Signed   By: Narda Rutherford M.D.   On: 01/18/2021 14:47   CT Abdomen Pelvis W Contrast  Result Date: 01/18/2021 CLINICAL DATA:  Abdominal abscess/infection suspected; weakness and diaphoresis; COVID positive EXAM: CT ABDOMEN AND PELVIS WITH CONTRAST TECHNIQUE: Multidetector CT imaging of the abdomen and pelvis was performed using the standard protocol following bolus administration of intravenous contrast. CONTRAST:  OMNIPAQUE IOHEXOL 300 MG/ML  SOLN COMPARISON:  August 21, 2014 FINDINGS: Lower chest: Cardiomegaly. Status post median sternotomy and CABG. Evaluation  of the parenchyma is limited secondary to respiratory motion. There is scattered atelectasis. Hepatobiliary: Cholelithiasis without evidence of acute cholecystitis. No intrahepatic or extrahepatic biliary ductal dilation. Portal vein is patent. No suspicious focal hepatic lesion within the limitations of artifact from respiratory motion. Pancreas: Unremarkable. No pancreatic ductal dilatation or surrounding inflammatory changes. Spleen: Splenule. No suspicious focal lesion within the limitations of artifact from respiratory motion. Adrenals/Urinary Tract: Adrenal glands are unremarkable. Kidneys enhance symmetrically. No hydronephrosis. Cyst of the inferior pole of the RIGHT kidney. No nephrolithiasis. Bladder is unremarkable. Stomach/Bowel: No evidence of bowel obstruction. No evidence of appendicitis. Small hiatal hernia. Vascular/Lymphatic: Severe atherosclerotic calcifications of the aorta. Severe atherosclerotic calcifications of the origin of the superior mesenteric artery result in moderate to severe narrowing. Confluent atherosclerotic calcifications of the origin of the celiac likely results in at least moderate stenosis. Flow seen distally. There is fusiform aneurysmal dilation of the infrarenal abdominal aorta to 3.4 x 3.3 cm. No suspicious lymphadenopathy. Reproductive: Prostate is unremarkable. Other: Fat containing RIGHT inguinal hernia. Musculoskeletal: New mild to moderate wedging of L1 since 2016. progressive advanced degenerative changes the large orals noted at L3-4. IMPRESSION: 1. No definitive CT etiology for acute abdominal pain identified. 2. Severe atherosclerotic calcifications at the origins of the celiac and SMA results in at least moderate to severe stenosis. Flow seen distally. No CT evidence of bowel ischemia. 3. New mild compression fracture deformity of L1. Recommend correlation with point tenderness. 4. Infrarenal abdominal aortic aneurysm to 3.4 cm. Recommend follow-up every 3  years. This recommendation follows ACR consensus guidelines: White Paper of the ACR Incidental Findings Committee II on Vascular Findings. J Am Coll Radiol 2013; 10:789-794. Aortic aneurysm NOS (ICD10-I71.9). Aortic Atherosclerosis (ICD10-I70.0). Electronically Signed   By: Meda Klinefelter MD   On: 01/18/2021 15:14    Procedures .Critical Care  Date/Time: 01/18/2021 6:06 PM Performed by: Mancel Bale, MD Authorized by: Mancel Bale, MD   Critical care provider statement:    Critical care time (minutes):  50  Critical care start time:  01/18/2021 11:35 AM   Critical care end time:  01/18/2021 4:45 PM   Critical care time was exclusive of:  Separately billable procedures and treating other patients   Critical care was necessary to treat or prevent imminent or life-threatening deterioration of the following conditions:  Respiratory failure   Critical care was time spent personally by me on the following activities:  Blood draw for specimens, development of treatment plan with patient or surrogate, discussions with consultants, evaluation of patient's response to treatment, examination of patient, obtaining history from patient or surrogate, ordering and performing treatments and interventions, ordering and review of laboratory studies, pulse oximetry, re-evaluation of patient's condition, review of old charts and ordering and review of radiographic studies   Medications Ordered in ED Medications  nirmatrelvir/ritonavir EUA (PAXLOVID) TABS 3 tablet (has no administration in time range)  sodium chloride 0.9 % bolus 500 mL (0 mLs Intravenous Stopped 01/18/21 1428)  pantoprazole (PROTONIX) injection 40 mg (40 mg Intravenous Given 01/18/21 1217)  iohexol (OMNIPAQUE) 300 MG/ML solution 100 mL (100 mLs Intravenous Contrast Given 01/18/21 1310)    ED Course  I have reviewed the triage vital signs and the nursing notes.  Pertinent labs & imaging results that were available during my care of the  patient were reviewed by me and considered in my medical decision making (see chart for details).    MDM Rules/Calculators/A&P                            Patient Vitals for the past 24 hrs:  BP Temp Temp src Pulse Resp SpO2 Height Weight  01/18/21 1500 135/70 -- -- -- -- -- -- --  01/18/21 1400 (!) 164/64 -- -- -- -- -- -- --  01/18/21 1300 (!) 141/53 -- -- (!) 57 17 -- -- --  01/18/21 1245 -- -- -- 63 18 93 % -- --  01/18/21 1230 121/61 -- -- (!) 59 18 92 % -- --  01/18/21 1200 (!) 135/59 -- -- (!) 53 14 93 % -- --  01/18/21 1136 (!) 139/54 99 F (37.2 C) Oral 71 18 94 % -- --  01/18/21 1129 -- -- -- -- -- -- 5\' 9"  (1.753 m) 102 kg    4:36 PM Reevaluation with update and discussion. After initial assessment and treatment, an updated evaluation reveals he remains comfortable and has no further complaints.   Medical Decision Making:  This patient is presenting for evaluation of reflux symptoms, which does require a range of treatment options, and is a complaint that involves a moderate risk of morbidity and mortality. The differential diagnoses include reflux, pneumonia, COVID infection, metabolic disorder. I decided to review old records, and in summary elderly man who is severely hard of hearing, and has a history of hypertension, chronic atrial fibrillation, and prediabetes..  I did not require additional historical information from anyone.  Clinical Laboratory Tests Ordered, included CBC, Metabolic panel, and venous blood gas, lipase, viral panel, INR . Review indicates normal except INR elevated, COVID-positive, white count elevated, MCV high, glucose high, calcium low. Radiologic Tests Ordered, included chest x-ray, CT abdomen pelvis.  I independently Visualized: Radiographic images, which show no acute abnormality   Critical Interventions-clinical evaluation, laboratory testing, radiography, observation and reassessment.  Consideration for Paxlovid treatment,  review of potential complications due to drug drug interactions.  Patient is a candidate for outpatient treatment with Paxlovid, while modifying certain medications.  After These Interventions, the Patient was reevaluated and was found stable for discharge.  COVID infection, acute, with stable respiratory and blood pressure findings.  Patient has been vaccinated and boosted against COVID.  He is stable for outpatient treatment.  CRITICAL CARE-yes Performed by: Mancel Bale  Nursing Notes Reviewed/ Care Coordinated Applicable Imaging Reviewed Interpretation of Laboratory Data incorporated into ED treatment  The patient appears reasonably screened and/or stabilized for discharge and I doubt any other medical condition or other [facility] requiring further screening, evaluation, or treatment in the ED at this time prior to discharge.  Plan: Home Medications-continue usual medicines with modifications as listed on AVS; Home Treatments-symptomatic, isolation; return here if the recommended treatment, does not improve the symptoms; Recommended follow up-PCP, as needed     Final Clinical Impression(s) / ED Diagnoses Final diagnoses:  COVID-19 virus infection    Rx / DC Orders ED Discharge Orders     None        Mancel Bale, MD 01/18/21 [phone]

## 2021-01-18 NOTE — Discharge Instructions (Signed)
You have a COVID infection, despite having had your vaccines.  To treat this we are starting you on Paxlovid, which you can tolerate with the following instructions.  Take your blood pressure medicine amlodipine/benazepril, every other day, starting on Sunday.  Do not take your atorvastatin, while on the Paxlovid.  You can restart your normal medicine, 1 day after completing the Paxlovid treatment.  To treat the symptoms of COVID, get plenty of rest, drink a lot of fluids and use Tylenol every 4 hours if needed for pain or fever.  You can use a cough medicine like Robitussin if needed.  See your doctor as needed for problems.  Return here if needed.

## 2021-01-28 ENCOUNTER — Ambulatory Visit: Payer: Medicare PPO | Admitting: Cardiology

## 2021-02-14 DIAGNOSIS — I4891 Unspecified atrial fibrillation: Secondary | ICD-10-CM | POA: Diagnosis not present

## 2021-02-14 DIAGNOSIS — G8929 Other chronic pain: Secondary | ICD-10-CM | POA: Diagnosis not present

## 2021-02-14 DIAGNOSIS — K08409 Partial loss of teeth, unspecified cause, unspecified class: Secondary | ICD-10-CM | POA: Diagnosis not present

## 2021-02-14 DIAGNOSIS — I252 Old myocardial infarction: Secondary | ICD-10-CM | POA: Diagnosis not present

## 2021-02-14 DIAGNOSIS — E785 Hyperlipidemia, unspecified: Secondary | ICD-10-CM | POA: Diagnosis not present

## 2021-02-14 DIAGNOSIS — E669 Obesity, unspecified: Secondary | ICD-10-CM | POA: Diagnosis not present

## 2021-02-14 DIAGNOSIS — D6869 Other thrombophilia: Secondary | ICD-10-CM | POA: Diagnosis not present

## 2021-02-14 DIAGNOSIS — I2510 Atherosclerotic heart disease of native coronary artery without angina pectoris: Secondary | ICD-10-CM | POA: Diagnosis not present

## 2021-02-14 DIAGNOSIS — I10 Essential (primary) hypertension: Secondary | ICD-10-CM | POA: Diagnosis not present

## 2021-02-18 ENCOUNTER — Ambulatory Visit (INDEPENDENT_AMBULATORY_CARE_PROVIDER_SITE_OTHER): Payer: Medicare PPO | Admitting: *Deleted

## 2021-02-18 DIAGNOSIS — I4891 Unspecified atrial fibrillation: Secondary | ICD-10-CM | POA: Diagnosis not present

## 2021-02-18 DIAGNOSIS — Z5181 Encounter for therapeutic drug level monitoring: Secondary | ICD-10-CM

## 2021-02-18 LAB — POCT INR: INR: 2.4 (ref 2.0–3.0)

## 2021-02-18 NOTE — Patient Instructions (Signed)
Continue warfarin 1/2 tablet daily except 1 tablet on Mondays and Thursdays °Recheck in 6 weeks °

## 2021-03-19 ENCOUNTER — Other Ambulatory Visit: Payer: Self-pay | Admitting: Cardiology

## 2021-03-30 ENCOUNTER — Inpatient Hospital Stay (HOSPITAL_COMMUNITY): Payer: Medicare PPO

## 2021-03-30 ENCOUNTER — Other Ambulatory Visit: Payer: Self-pay

## 2021-03-30 ENCOUNTER — Emergency Department (HOSPITAL_COMMUNITY): Payer: Medicare PPO

## 2021-03-30 ENCOUNTER — Inpatient Hospital Stay (HOSPITAL_COMMUNITY)
Admission: EM | Admit: 2021-03-30 | Discharge: 2021-04-30 | DRG: 308 | Disposition: E | Payer: Medicare PPO | Attending: Pulmonary Disease | Admitting: Pulmonary Disease

## 2021-03-30 DIAGNOSIS — Z6834 Body mass index (BMI) 34.0-34.9, adult: Secondary | ICD-10-CM | POA: Diagnosis not present

## 2021-03-30 DIAGNOSIS — R079 Chest pain, unspecified: Secondary | ICD-10-CM | POA: Diagnosis not present

## 2021-03-30 DIAGNOSIS — E669 Obesity, unspecified: Secondary | ICD-10-CM | POA: Diagnosis not present

## 2021-03-30 DIAGNOSIS — R570 Cardiogenic shock: Secondary | ICD-10-CM | POA: Diagnosis not present

## 2021-03-30 DIAGNOSIS — Z951 Presence of aortocoronary bypass graft: Secondary | ICD-10-CM

## 2021-03-30 DIAGNOSIS — I472 Ventricular tachycardia, unspecified: Secondary | ICD-10-CM | POA: Diagnosis present

## 2021-03-30 DIAGNOSIS — I672 Cerebral atherosclerosis: Secondary | ICD-10-CM | POA: Diagnosis not present

## 2021-03-30 DIAGNOSIS — I255 Ischemic cardiomyopathy: Secondary | ICD-10-CM | POA: Diagnosis not present

## 2021-03-30 DIAGNOSIS — I252 Old myocardial infarction: Secondary | ICD-10-CM

## 2021-03-30 DIAGNOSIS — J690 Pneumonitis due to inhalation of food and vomit: Secondary | ICD-10-CM | POA: Diagnosis not present

## 2021-03-30 DIAGNOSIS — I4901 Ventricular fibrillation: Principal | ICD-10-CM | POA: Diagnosis present

## 2021-03-30 DIAGNOSIS — I2510 Atherosclerotic heart disease of native coronary artery without angina pectoris: Secondary | ICD-10-CM | POA: Diagnosis present

## 2021-03-30 DIAGNOSIS — R0789 Other chest pain: Secondary | ICD-10-CM | POA: Diagnosis not present

## 2021-03-30 DIAGNOSIS — I469 Cardiac arrest, cause unspecified: Secondary | ICD-10-CM | POA: Diagnosis not present

## 2021-03-30 DIAGNOSIS — S2243XA Multiple fractures of ribs, bilateral, initial encounter for closed fracture: Secondary | ICD-10-CM | POA: Diagnosis not present

## 2021-03-30 DIAGNOSIS — I462 Cardiac arrest due to underlying cardiac condition: Secondary | ICD-10-CM | POA: Diagnosis present

## 2021-03-30 DIAGNOSIS — Z7901 Long term (current) use of anticoagulants: Secondary | ICD-10-CM

## 2021-03-30 DIAGNOSIS — K7200 Acute and subacute hepatic failure without coma: Secondary | ICD-10-CM | POA: Diagnosis not present

## 2021-03-30 DIAGNOSIS — E785 Hyperlipidemia, unspecified: Secondary | ICD-10-CM | POA: Diagnosis present

## 2021-03-30 DIAGNOSIS — I110 Hypertensive heart disease with heart failure: Secondary | ICD-10-CM | POA: Diagnosis present

## 2021-03-30 DIAGNOSIS — I5022 Chronic systolic (congestive) heart failure: Secondary | ICD-10-CM | POA: Diagnosis present

## 2021-03-30 DIAGNOSIS — E782 Mixed hyperlipidemia: Secondary | ICD-10-CM | POA: Diagnosis not present

## 2021-03-30 DIAGNOSIS — Y848 Other medical procedures as the cause of abnormal reaction of the patient, or of later complication, without mention of misadventure at the time of the procedure: Secondary | ICD-10-CM | POA: Diagnosis present

## 2021-03-30 DIAGNOSIS — Z66 Do not resuscitate: Secondary | ICD-10-CM | POA: Diagnosis not present

## 2021-03-30 DIAGNOSIS — E872 Acidosis, unspecified: Secondary | ICD-10-CM | POA: Diagnosis present

## 2021-03-30 DIAGNOSIS — N4342 Spermatocele of epididymis, multiple: Secondary | ICD-10-CM | POA: Diagnosis not present

## 2021-03-30 DIAGNOSIS — R778 Other specified abnormalities of plasma proteins: Secondary | ICD-10-CM | POA: Diagnosis present

## 2021-03-30 DIAGNOSIS — I4821 Permanent atrial fibrillation: Secondary | ICD-10-CM | POA: Diagnosis not present

## 2021-03-30 DIAGNOSIS — J9601 Acute respiratory failure with hypoxia: Secondary | ICD-10-CM | POA: Diagnosis present

## 2021-03-30 DIAGNOSIS — I10 Essential (primary) hypertension: Secondary | ICD-10-CM | POA: Diagnosis present

## 2021-03-30 DIAGNOSIS — Z20822 Contact with and (suspected) exposure to covid-19: Secondary | ICD-10-CM | POA: Diagnosis present

## 2021-03-30 DIAGNOSIS — N401 Enlarged prostate with lower urinary tract symptoms: Secondary | ICD-10-CM | POA: Diagnosis present

## 2021-03-30 DIAGNOSIS — M96A3 Multiple fractures of ribs associated with chest compression and cardiopulmonary resuscitation: Secondary | ICD-10-CM | POA: Diagnosis present

## 2021-03-30 DIAGNOSIS — J9811 Atelectasis: Secondary | ICD-10-CM | POA: Diagnosis not present

## 2021-03-30 DIAGNOSIS — R609 Edema, unspecified: Secondary | ICD-10-CM

## 2021-03-30 DIAGNOSIS — R404 Transient alteration of awareness: Secondary | ICD-10-CM | POA: Diagnosis not present

## 2021-03-30 DIAGNOSIS — Z79899 Other long term (current) drug therapy: Secondary | ICD-10-CM

## 2021-03-30 DIAGNOSIS — R338 Other retention of urine: Secondary | ICD-10-CM | POA: Diagnosis present

## 2021-03-30 DIAGNOSIS — Z87442 Personal history of urinary calculi: Secondary | ICD-10-CM

## 2021-03-30 DIAGNOSIS — Z833 Family history of diabetes mellitus: Secondary | ICD-10-CM

## 2021-03-30 DIAGNOSIS — Z515 Encounter for palliative care: Secondary | ICD-10-CM | POA: Diagnosis not present

## 2021-03-30 DIAGNOSIS — Z8249 Family history of ischemic heart disease and other diseases of the circulatory system: Secondary | ICD-10-CM

## 2021-03-30 DIAGNOSIS — R918 Other nonspecific abnormal finding of lung field: Secondary | ICD-10-CM | POA: Diagnosis not present

## 2021-03-30 DIAGNOSIS — I517 Cardiomegaly: Secondary | ICD-10-CM | POA: Diagnosis not present

## 2021-03-30 DIAGNOSIS — Z452 Encounter for adjustment and management of vascular access device: Secondary | ICD-10-CM

## 2021-03-30 DIAGNOSIS — I499 Cardiac arrhythmia, unspecified: Secondary | ICD-10-CM | POA: Diagnosis not present

## 2021-03-30 DIAGNOSIS — Z886 Allergy status to analgesic agent status: Secondary | ICD-10-CM

## 2021-03-30 LAB — PROTIME-INR
INR: 4.5 (ref 0.8–1.2)
Prothrombin Time: 42.7 seconds — ABNORMAL HIGH (ref 11.4–15.2)

## 2021-03-30 LAB — I-STAT CHEM 8, ED
BUN: 25 mg/dL — ABNORMAL HIGH (ref 8–23)
Calcium, Ion: 1.04 mmol/L — ABNORMAL LOW (ref 1.15–1.40)
Chloride: 108 mmol/L (ref 98–111)
Creatinine, Ser: 1 mg/dL (ref 0.61–1.24)
Glucose, Bld: 186 mg/dL — ABNORMAL HIGH (ref 70–99)
HCT: 39 % (ref 39.0–52.0)
Hemoglobin: 13.3 g/dL (ref 13.0–17.0)
Potassium: 3.8 mmol/L (ref 3.5–5.1)
Sodium: 140 mmol/L (ref 135–145)
TCO2: 21 mmol/L — ABNORMAL LOW (ref 22–32)

## 2021-03-30 LAB — I-STAT ARTERIAL BLOOD GAS, ED
Acid-base deficit: 10 mmol/L — ABNORMAL HIGH (ref 0.0–2.0)
Bicarbonate: 15.7 mmol/L — ABNORMAL LOW (ref 20.0–28.0)
Calcium, Ion: 1.23 mmol/L (ref 1.15–1.40)
HCT: 35 % — ABNORMAL LOW (ref 39.0–52.0)
Hemoglobin: 11.9 g/dL — ABNORMAL LOW (ref 13.0–17.0)
O2 Saturation: 98 %
Patient temperature: 98.6
Potassium: 3.4 mmol/L — ABNORMAL LOW (ref 3.5–5.1)
Sodium: 140 mmol/L (ref 135–145)
TCO2: 17 mmol/L — ABNORMAL LOW (ref 22–32)
pCO2 arterial: 33.1 mmHg (ref 32.0–48.0)
pH, Arterial: 7.284 — ABNORMAL LOW (ref 7.350–7.450)
pO2, Arterial: 108 mmHg (ref 83.0–108.0)

## 2021-03-30 LAB — COMPREHENSIVE METABOLIC PANEL
ALT: 242 U/L — ABNORMAL HIGH (ref 0–44)
AST: 223 U/L — ABNORMAL HIGH (ref 15–41)
Albumin: 2.9 g/dL — ABNORMAL LOW (ref 3.5–5.0)
Alkaline Phosphatase: 116 U/L (ref 38–126)
Anion gap: 14 (ref 5–15)
BUN: 19 mg/dL (ref 8–23)
CO2: 16 mmol/L — ABNORMAL LOW (ref 22–32)
Calcium: 8 mg/dL — ABNORMAL LOW (ref 8.9–10.3)
Chloride: 108 mmol/L (ref 98–111)
Creatinine, Ser: 1.2 mg/dL (ref 0.61–1.24)
GFR, Estimated: 58 mL/min — ABNORMAL LOW (ref >60)
Glucose, Bld: 193 mg/dL — ABNORMAL HIGH (ref 70–99)
Potassium: 3.8 mmol/L (ref 3.5–5.1)
Sodium: 138 mmol/L (ref 135–145)
Total Bilirubin: 0.9 mg/dL (ref 0.3–1.2)
Total Protein: 5.7 g/dL — ABNORMAL LOW (ref 6.5–8.1)

## 2021-03-30 LAB — CBC
HCT: 43.7 % (ref 39.0–52.0)
HCT: 37.9 % — ABNORMAL LOW (ref 39.0–52.0)
Hemoglobin: 13.9 g/dL (ref 13.0–17.0)
Hemoglobin: 12.5 g/dL — ABNORMAL LOW (ref 13.0–17.0)
MCH: 33.6 pg (ref 26.0–34.0)
MCH: 33.8 pg (ref 26.0–34.0)
MCHC: 31.8 g/dL (ref 30.0–36.0)
MCHC: 33 g/dL (ref 30.0–36.0)
MCV: 105.6 fL — ABNORMAL HIGH (ref 80.0–100.0)
MCV: 102.4 fL — ABNORMAL HIGH (ref 80.0–100.0)
Platelets: 195 10*3/uL (ref 150–400)
Platelets: 195 10*3/uL (ref 150–400)
RBC: 4.14 MIL/uL — ABNORMAL LOW (ref 4.22–5.81)
RBC: 3.7 MIL/uL — ABNORMAL LOW (ref 4.22–5.81)
RDW: 14.1 % (ref 11.5–15.5)
RDW: 14.2 % (ref 11.5–15.5)
WBC: 31.5 10*3/uL — ABNORMAL HIGH (ref 4.0–10.5)
WBC: 28.1 10*3/uL — ABNORMAL HIGH (ref 4.0–10.5)
nRBC: 0.1 % (ref 0.0–0.2)
nRBC: 0.1 % (ref 0.0–0.2)

## 2021-03-30 LAB — D-DIMER, QUANTITATIVE: D-Dimer, Quant: >20 ug/mL-FEU — ABNORMAL HIGH (ref 0.00–0.50)

## 2021-03-30 LAB — PROCALCITONIN: Procalcitonin: 0.85 ng/mL

## 2021-03-30 LAB — COOXEMETRY PANEL
Carboxyhemoglobin: 0.5 % (ref 0.5–1.5)
Methemoglobin: 0.8 % (ref 0.0–1.5)
O2 Saturation: 52.5 %
Total hemoglobin: 12.7 g/dL (ref 12.0–16.0)

## 2021-03-30 LAB — LACTIC ACID, PLASMA
Lactic Acid, Venous: 6.3 mmol/L (ref 0.5–1.9)
Lactic Acid, Venous: 8.1 mmol/L (ref 0.5–1.9)

## 2021-03-30 LAB — MAGNESIUM: Magnesium: 2.1 mg/dL (ref 1.7–2.4)

## 2021-03-30 LAB — RESP PANEL BY RT-PCR (FLU A&B, COVID) ARPGX2
Influenza A by PCR: NEGATIVE
Influenza B by PCR: NEGATIVE
SARS Coronavirus 2 by RT PCR: NEGATIVE

## 2021-03-30 LAB — TROPONIN I (HIGH SENSITIVITY)
Troponin I (High Sensitivity): 832 ng/L (ref <18)
Troponin I (High Sensitivity): 2996 ng/L (ref <18)

## 2021-03-30 LAB — CREATININE, SERUM
Creatinine, Ser: 1.52 mg/dL — ABNORMAL HIGH (ref 0.61–1.24)
GFR, Estimated: 44 mL/min — ABNORMAL LOW (ref >60)

## 2021-03-30 LAB — MRSA NEXT GEN BY PCR, NASAL: MRSA by PCR Next Gen: NOT DETECTED

## 2021-03-30 MED ORDER — MIDAZOLAM HCL 2 MG/2ML IJ SOLN
1.0000 mg | INTRAMUSCULAR | Status: DC | PRN
Start: 2021-03-30 — End: 2021-03-30

## 2021-03-30 MED ORDER — MIDAZOLAM HCL 2 MG/2ML IJ SOLN: 1.0000 mg | INTRAMUSCULAR | Status: DC | PRN

## 2021-03-30 MED ORDER — DEXTROSE 5 % IV SOLN: INTRAVENOUS | Status: DC

## 2021-03-30 MED ORDER — EPINEPHRINE 1 MG/10ML IJ SOSY
PREFILLED_SYRINGE | INTRAMUSCULAR | Status: AC | PRN
Start: 2021-03-30 — End: 2021-03-30
  Administered 2021-03-30: 1 mg via INTRAVENOUS

## 2021-03-30 MED ORDER — CHLORHEXIDINE GLUCONATE 0.12% ORAL RINSE (MEDLINE KIT)
15.0000 mL | Freq: Two times a day (BID) | OROMUCOSAL | Status: DC
  Administered 2021-03-30: 15 mL via OROMUCOSAL

## 2021-03-30 MED ORDER — CHLORHEXIDINE GLUCONATE CLOTH 2 % EX PADS: 6.0000 | MEDICATED_PAD | Freq: Every day | CUTANEOUS | Status: DC

## 2021-03-30 MED ORDER — FENTANYL CITRATE PF 50 MCG/ML IJ SOSY: 50.0000 ug | PREFILLED_SYRINGE | INTRAMUSCULAR | Status: DC | PRN

## 2021-03-30 MED ORDER — EPINEPHRINE 0.1 MG/10ML (10 MCG/ML) SYRINGE FOR IV PUSH (FOR BLOOD PRESSURE SUPPORT)
PREFILLED_SYRINGE | INTRAVENOUS | Status: AC | PRN
  Administered 2021-03-30 (×2): 40 ug via INTRAVENOUS

## 2021-03-30 MED ORDER — PANTOPRAZOLE SODIUM 40 MG IV SOLR: 40.0000 mg | Freq: Every day | INTRAVENOUS | Status: DC

## 2021-03-30 MED ORDER — FENTANYL CITRATE PF 50 MCG/ML IJ SOSY: 100.0000 ug | PREFILLED_SYRINGE | Freq: Once | INTRAMUSCULAR | Status: DC

## 2021-03-30 MED ORDER — SODIUM BICARBONATE 8.4 % IV SOLN: 50.0000 meq | Freq: Once | INTRAVENOUS | Status: DC

## 2021-03-30 MED ORDER — IOHEXOL 350 MG/ML SOLN
100.0000 mL | Freq: Once | INTRAVENOUS | Status: AC | PRN
  Administered 2021-03-30: 100 mL via INTRAVENOUS

## 2021-03-30 MED ORDER — DIPHENHYDRAMINE HCL 50 MG/ML IJ SOLN: 25.0000 mg | INTRAMUSCULAR | Status: DC | PRN

## 2021-03-30 MED ORDER — GLYCOPYRROLATE 0.2 MG/ML IJ SOLN: 0.2000 mg | INTRAMUSCULAR | Status: DC | PRN

## 2021-03-30 MED ORDER — DOCUSATE SODIUM 50 MG/5ML PO LIQD: 100.0000 mg | Freq: Every day | ORAL | Status: DC | PRN

## 2021-03-30 MED ORDER — FENTANYL CITRATE PF 50 MCG/ML IJ SOSY: 25.0000 ug | PREFILLED_SYRINGE | INTRAMUSCULAR | Status: DC | PRN

## 2021-03-30 MED ORDER — FENTANYL CITRATE PF 50 MCG/ML IJ SOSY
100.0000 ug | PREFILLED_SYRINGE | Freq: Once | INTRAMUSCULAR | Status: AC
  Administered 2021-03-30: 100 ug via INTRAVENOUS
  Filled 2021-03-30: qty 2

## 2021-03-30 MED ORDER — FENTANYL 2500MCG IN NS 250ML (10MCG/ML) PREMIX INFUSION
25.0000 ug/h | INTRAVENOUS | Status: DC
  Administered 2021-03-30: 25 ug/h via INTRAVENOUS
  Filled 2021-03-30: qty 250

## 2021-03-30 MED ORDER — MIDAZOLAM HCL 2 MG/2ML IJ SOLN: 1.0000 mg | Freq: Once | INTRAMUSCULAR | Status: DC

## 2021-03-30 MED ORDER — POLYETHYLENE GLYCOL 3350 17 G PO PACK: 17.0000 g | PACK | Freq: Every day | ORAL | Status: DC | PRN

## 2021-03-30 MED ORDER — POLYVINYL ALCOHOL 1.4 % OP SOLN
1.0000 [drp] | Freq: Four times a day (QID) | OPHTHALMIC | Status: DC | PRN
  Filled 2021-03-30: qty 15

## 2021-03-30 MED ORDER — SODIUM CHLORIDE 0.9 % IV SOLN
2.0000 g | INTRAVENOUS | Status: DC
  Administered 2021-03-30: 2 g via INTRAVENOUS
  Filled 2021-03-30: qty 20

## 2021-03-30 MED ORDER — AMIODARONE HCL IN DEXTROSE 360-4.14 MG/200ML-% IV SOLN
30.0000 mg/h | INTRAVENOUS | Status: DC
  Administered 2021-03-30: 30 mg/h via INTRAVENOUS
  Filled 2021-03-30: qty 200

## 2021-03-30 MED ORDER — GLYCOPYRROLATE 0.2 MG/ML IJ SOLN
0.2000 mg | INTRAMUSCULAR | Status: DC | PRN
  Administered 2021-03-30: 0.2 mg via INTRAVENOUS
  Filled 2021-03-30: qty 1

## 2021-03-30 MED ORDER — ACETAMINOPHEN 325 MG PO TABS: 650.0000 mg | ORAL_TABLET | Freq: Four times a day (QID) | ORAL | Status: DC | PRN

## 2021-03-30 MED ORDER — SODIUM CHLORIDE 0.9 % IV SOLN: INTRAVENOUS | Status: DC | PRN

## 2021-03-30 MED ORDER — FENTANYL BOLUS VIA INFUSION
25.0000 ug | INTRAVENOUS | Status: DC | PRN
  Filled 2021-03-30: qty 100

## 2021-03-30 MED ORDER — SODIUM CHLORIDE 0.9 % IV BOLUS
1000.0000 mL | Freq: Once | INTRAVENOUS | Status: AC
  Administered 2021-03-30: 1000 mL via INTRAVENOUS

## 2021-03-30 MED ORDER — FENTANYL CITRATE PF 50 MCG/ML IJ SOSY: 25.0000 ug | PREFILLED_SYRINGE | Freq: Once | INTRAMUSCULAR | Status: DC

## 2021-03-30 MED ORDER — VASOPRESSIN 20 UNITS/100 ML INFUSION FOR SHOCK
0.0000 [IU]/min | INTRAVENOUS | Status: DC
Start: 2021-03-30 — End: 2021-03-31
  Administered 2021-03-30: 0.03 [IU]/min via INTRAVENOUS
  Filled 2021-03-30 (×2): qty 100

## 2021-03-30 MED ORDER — MIDAZOLAM BOLUS VIA INFUSION
0.0000 mg | INTRAVENOUS | Status: DC | PRN
  Filled 2021-03-30: qty 5

## 2021-03-30 MED ORDER — DOCUSATE SODIUM 100 MG PO CAPS: 100.0000 mg | ORAL_CAPSULE | Freq: Two times a day (BID) | ORAL | Status: DC | PRN

## 2021-03-30 MED ORDER — FENTANYL BOLUS VIA INFUSION
100.0000 ug | INTRAVENOUS | Status: DC | PRN
  Filled 2021-03-30: qty 100

## 2021-03-30 MED ORDER — FENTANYL 2500MCG IN NS 250ML (10MCG/ML) PREMIX INFUSION: 0.0000 ug/h | INTRAVENOUS | Status: DC

## 2021-03-30 MED ORDER — CALCIUM CHLORIDE 10 % IV SOLN
INTRAVENOUS | Status: AC | PRN
  Administered 2021-03-30: 1 g via INTRAVENOUS

## 2021-03-30 MED ORDER — EPINEPHRINE HCL 5 MG/250ML IV SOLN IN NS
0.5000 ug/min | INTRAVENOUS | Status: DC
  Administered 2021-03-30 (×2): 20 ug/min via INTRAVENOUS
  Administered 2021-03-30: 10 ug/min via INTRAVENOUS
  Filled 2021-03-30: qty 250

## 2021-03-30 MED ORDER — NOREPINEPHRINE 4 MG/250ML-% IV SOLN
0.0000 ug/min | INTRAVENOUS | Status: DC
  Administered 2021-03-30: 2 ug/min via INTRAVENOUS
  Administered 2021-03-30: 20 ug/min via INTRAVENOUS
  Filled 2021-03-30 (×2): qty 250

## 2021-03-30 MED ORDER — FENTANYL CITRATE PF 50 MCG/ML IJ SOSY
25.0000 ug | PREFILLED_SYRINGE | INTRAMUSCULAR | Status: DC | PRN
  Administered 2021-03-30: 50 ug via INTRAVENOUS
  Filled 2021-03-30: qty 1

## 2021-03-30 MED ORDER — MIDAZOLAM HCL 2 MG/2ML IJ SOLN
2.0000 mg | INTRAMUSCULAR | Status: DC | PRN
  Administered 2021-03-30: 4 mg via INTRAVENOUS
  Filled 2021-03-30: qty 4

## 2021-03-30 MED ORDER — AMIODARONE HCL IN DEXTROSE 360-4.14 MG/200ML-% IV SOLN
60.0000 mg/h | INTRAVENOUS | Status: AC
  Administered 2021-03-30 (×2): 60 mg/h via INTRAVENOUS
  Filled 2021-03-30: qty 200

## 2021-03-30 MED ORDER — MIDAZOLAM-SODIUM CHLORIDE 100-0.9 MG/100ML-% IV SOLN
0.0000 mg/h | INTRAVENOUS | Status: DC
Start: 2021-03-30 — End: 2021-03-30
  Administered 2021-03-30: 1 mg/h via INTRAVENOUS
  Filled 2021-03-30: qty 100

## 2021-03-30 MED ORDER — ORAL CARE MOUTH RINSE
15.0000 mL | OROMUCOSAL | Status: DC
  Administered 2021-03-31: 15 mL via OROMUCOSAL

## 2021-03-30 MED ORDER — HEPARIN SODIUM (PORCINE) 5000 UNIT/ML IJ SOLN: 5000.0000 [IU] | Freq: Three times a day (TID) | INTRAMUSCULAR | Status: DC

## 2021-03-30 MED ORDER — GLYCOPYRROLATE 1 MG PO TABS
1.0000 mg | ORAL_TABLET | ORAL | Status: DC | PRN
  Filled 2021-03-30: qty 1

## 2021-03-30 MED ORDER — SODIUM CHLORIDE 0.9 % IV SOLN: INTRAVENOUS | Status: DC

## 2021-03-30 MED ORDER — ACETAMINOPHEN 650 MG RE SUPP: 650.0000 mg | Freq: Four times a day (QID) | RECTAL | Status: DC | PRN

## 2021-03-31 NOTE — Progress Notes (Signed)
Royston Bake RN and Ronne Binning RN wasted 70ml of Versed and of Fentanyl from patients gtt.

## 2021-04-01 ENCOUNTER — Ambulatory Visit: Payer: Self-pay | Admitting: *Deleted

## 2021-04-04 LAB — CULTURE, BLOOD (ROUTINE X 2)
Culture: NO GROWTH
Culture: NO GROWTH
Special Requests: ADEQUATE

## 2021-04-15 NOTE — Discharge Summary (Signed)
DEATH SUMMARY   Patient Details  Name: [name] MRN: [id number] DOB: 14-Sep-1930  Admission/Discharge Information   Admit Date:  2021-04-04  Date of Death: Date of Death: 04-04-21  Time of Death: Time of Death: Oct 12, 2340  Length of Stay: 1  Referring Physician: Babs Sciara, MD   Reason(s) for Hospitalization  VF cardiac arrest  Diagnoses  Preliminary cause of death:   Cardiogenic shock Secondary Diagnoses (including complications and co-morbidities):  Principal Problem:   Cardiac arrest [facility]) Active Problems:   Hyperlipidemia   Essential hypertension   Chronic anticoagulation   Cardiomyopathy, ischemic   Brief Hospital Course (including significant findings, care, treatment, and services provided and events leading to death)  [name] is a 87 y.o. year old male who presented with a VF cardiac arrest.  Brought in by EMS.  Significant history of coronary disease status post CABG dyslipidemia hypertension and atrial fibrillation.  Despite obtaining ROSC he remained in refractory vasopressor dependent shock with a persistently elevated lactate.  He did not show any signs of early neurological recovery.  Given refractory shock in an elderly patient with recent cardiac arrest, we had not early goals of care discussion with the family given the patient's dismal prognosis and decision was made to transition him to comfort care.   Pertinent Labs and Studies  Significant Diagnostic Studies CT HEAD WO CONTRAST ( )  Result Date: 2021/04/04 CLINICAL DATA:  Cardiac arrest. EXAM: CT HEAD WITHOUT CONTRAST TECHNIQUE: Contiguous axial images were obtained from the base of the skull through the vertex without intravenous contrast. COMPARISON:  MRI brain dated June 25, 2011. FINDINGS: Brain: No evidence of acute infarction, hemorrhage, hydrocephalus, extra-axial collection or mass lesion/mass effect. Mild-to-moderate generalized cerebral atrophy. Scattered moderate  periventricular and subcortical white matter hypodensities are nonspecific, but favored to reflect chronic microvascular ischemic changes. Vascular: Calcified atherosclerosis at the skullbase. No hyperdense vessel. Skull: Normal. Negative for fracture or focal lesion. Sinuses/Orbits: Small air-fluid level in the right maxillary sinus. Secretions in the posterior nasopharynx. Endotracheal and enteric tubes partially visualized. Other: None. IMPRESSION: 1. No acute intracranial abnormality. Electronically Signed   By: Obie Dredge M.D.   On: 2021-04-04 14:27   CT Angio Chest PE W and/or Wo Contrast  Addendum Date: 04-04-2021   ADDENDUM REPORT: 2021/04/04 14:33 ADDENDUM: Critical Value/emergent results were called by telephone on 04-Apr-2021 at 2:33 pm to provider JON KNAPP , who verbally acknowledged these results. Electronically Signed   By: Allegra Lai M.D.   On: April 04, 2021 14:33   Result Date: 2021/04/04 CLINICAL DATA:  Concern for pulmonary embolus EXAM: CT ANGIOGRAPHY CHEST WITH CONTRAST TECHNIQUE: Multidetector CT imaging of the chest was performed using the standard protocol during bolus administration of intravenous contrast. Multiplanar CT image reconstructions and MIPs were obtained to evaluate the vascular anatomy. CONTRAST:  OMNIPAQUE IOHEXOL 350 MG/ML SOLN COMPARISON:  None. FINDINGS: Cardiovascular: Adequate contrast opacification of the pulmonary arteries. No evidence of pulmonary embolus. Three-vessel coronary artery calcifications. Prior CABG. Mild cardiomegaly with no evidence of pericardial effusion. Mild stranding of the anterior mediastinal fat with no fluid collections. Mediastinum/Nodes: Enteric tube courses through the esophagus into the stomach. No pathologically enlarged lymph nodes seen in the chest. Lungs/Pleura: Bilateral bronchial wall thickening with scattered areas of endobronchial debris., most pronounced in the bilateral lower lungs. Mild interlobular septal  thickening. Dependent consolidations of the right upper and lower lobe. Additional bilateral nodular opacities which are most pronounced in the bilateral lower lungs and right upper lobe.  Small left and trace right basilar pneumothoraces. Upper Abdomen: Cholelithiasis.  No acute abnormality. Musculoskeletal: Bilateral anterior rib fractures spanning the 2nd-7th ribs. Soft tissue thickening is seen adjacent to some of the rib fractures, likely due to chest wall hematoma. Review of the MIP images confirms the above findings. IMPRESSION: No evidence of pulmonary embolus. Small left and trace right pneumothoraces. Mild stranding of the anterior mediastinal fat, likely due to mediastinal hematoma. Endobronchial debris, dependent consolidations of the right lung and bilateral nodular opacities, findings are likely due to aspiration, infection is an additional consideration. Areas of mild interlobular septal thickening suggest a component of pulmonary edema. Bilateral anterior rib fractures spanning the 2nd-7th ribs. Soft tissue thickening is seen adjacent to some of the rib fractures, likely due to chest wall hematomas. These results will be called to the ordering clinician by the Radiologist, and communication documented in the PACS. Electronically Signed: By: Allegra Lai M.D. On: 04/14/21 14:22   US SCROTUM  Result Date: 04-14-21 CLINICAL DATA:  Scrotal edema. EXAM: SCROTAL ULTRASOUND DOPPLER ULTRASOUND OF THE TESTICLES TECHNIQUE: Complete ultrasound examination of the testicles, epididymis, and other scrotal structures was performed. Color and spectral Doppler ultrasound were also utilized to evaluate blood flow to the testicles. COMPARISON:  None. FINDINGS: Right testicle Measurements: 5.7 cm x 1.9 cm x 1.8 cm. No mass or microlithiasis visualized. Left testicle The left testicle is not visualized. Right epididymis:  Normal in size and appearance. Left epididymis:  The left epididymis is not visualized.  Hydrocele:  None visualized. Varicocele:  None visualized. Other: A 13.3 cm x 9.0 cm x 9.8 cm spermatocele is noted on the LEFT. Pulsed Doppler interrogation of the RIGHT testicle demonstrates normal low resistance arterial and venous waveforms bilaterally. IMPRESSION: Large left-sided spermatocele, with subsequently limited visualization of the left testicle and left epididymis. Electronically Signed   By: Aram Candela M.D.   On: Apr 14, 2021 19:01   DG Chest Port 1 View  Result Date: Apr 14, 2021 CLINICAL DATA:  Intubation after cardiopulmonary resuscitation. EXAM: PORTABLE CHEST 1 VIEW COMPARISON:  Chest radiograph dated 04-14-21. FINDINGS: The heart is enlarged. Vascular calcifications are seen in the aortic arch. Atelectasis and bilateral interstitial opacities are seen throughout both lungs, most significant in the right upper lung. There is no pneumothorax. An endotracheal tube terminates in the upper thoracic trachea. An enteric tube terminates in the stomach. Defibrillator pads are noted. Median sternotomy wires are redemonstrated. A right internal jugular catheter tip overlies the superior aspect of the superior vena cava. Previously seen rib fractures are less well demonstrated on this exam. IMPRESSION: Atelectasis and bilateral interstitial opacities, most significant in the right upper lung. Aortic Atherosclerosis (ICD10-I70.0). Electronically Signed   By: Romona Curls M.D.   On: 04-14-21 15:20   DG Chest Portable 1 View  Result Date: 2021-04-14 CLINICAL DATA:  History of cardiac arrest EXAM: PORTABLE CHEST 1 VIEW COMPARISON:  Chest x-ray dated January 18, 2021 FINDINGS: ET tube tip is proximally 8.0 cm from the carina. Cardiac and mediastinal contours are unchanged post median sternotomy. Heterogeneous opacities of the bilateral upper lungs. No large pleural effusion or evidence of pneumothorax. IMPRESSION: ET tube tip is proximally 8.0 cm from the carina. Right-greater-than-left  heterogeneous opacities which are most pronounced in the upper lungs, differential possibility infection or aspiration. Electronically Signed   By: Allegra Lai M.D.   On: April 14, 2021 13:58   EEG adult  Result Date: April 14, 2021 Jefferson Fuel, MD     2021/04/14  9:42 PM  Routine EEG Report SOMA LIZAK is a 87 y.o. male with a history of cardiac arrest who is undergoing an EEG to evaluate for seizures. Report: This EEG was acquired with electrodes placed according to the International 10-20 electrode system (including Fp1, Fp2, F3, F4, C3, C4, P3, P4, O1, O2, T3, T4, T5, T6, A1, A2, Fz, Cz, Pz). The following electrodes were missing or displaced: none. The best background was continuous delta frequencies This activity is reactive to stimulation. There was no sleep architecture identified. There was EMG artifact throughout the recording in the bilateral anterior leads. There was no focal slowing. There were no interictal epileptiform discharges. There were no electrographic seizures identified. Photic stimulation and hyperventilation were not performed. Impression and clinical correlation: This EEG was obtained while comatose and is abnormal due to severe diffuse slowing indicative of global cerebral dysfunciton. Bing Neighbors, MD [facility] [phone] If 7pm- 7am, please page neurology on call as listed in AMION.    Microbiology No results found for this or any previous visit (from the past 240 hour(s)).  Lab Basic Metabolic Panel: No results for input(s): NA, K, CL, CO2, GLUCOSE, BUN, CREATININE, CALCIUM, MG, PHOS in the last 168 hours. Liver Function Tests: No results for input(s): AST, ALT, ALKPHOS, BILITOT, PROT, ALBUMIN in the last 168 hours. No results for input(s): LIPASE, AMYLASE in the last 168 hours. No results for input(s): AMMONIA in the last 168 hours. CBC: No results for input(s): WBC, NEUTROABS, HGB, HCT, MCV, PLT in the last 168 hours. Cardiac Enzymes: No results  for input(s): CKTOTAL, CKMB, CKMBINDEX, TROPONINI in the last 168 hours. Sepsis Labs: No results for input(s): PROCALCITON, WBC, LATICACIDVEN in the last 168 hours.  Procedures/Operations  Mechanical ventilation.   Shellye Zandi 04/15/2021, 11:50 AM

## 2021-04-30 NOTE — Progress Notes (Signed)
PCCM Progress Note   Notified that bedside RN unable to place Foley cath. On my assessment patients scrotum is very swollen and firm to touch. Family reports that patient has a history of scrotum swelling (reported due to hx of mumps) but has never had any issue with urination. Attempt made by myself for placement of coude cath but this was unsuccessful as well.   Urology consult was called and will come to bedside to attempt to place Foley.   Given concern for sepsis for completion will also obtain testicular ultrasound to rule out Fournier Gangrene.   [name] D. Tiburcio Pea, NP-C [facility] Personal contact information can be found on Amion  2021/04/16, 6:09 PM

## 2021-04-30 NOTE — Procedures (Signed)
Routine EEG Report  [name] is a 87 y.o. male with a history of cardiac arrest who is undergoing an EEG to evaluate for seizures.  Report: This EEG was acquired with electrodes placed according to the International 10-20 electrode system (including Fp1, Fp2, F3, F4, C3, C4, P3, P4, O1, O2, T3, T4, T5, T6, A1, A2, Fz, Cz, Pz). The following electrodes were missing or displaced: none.  The best background was continuous delta frequencies This activity is reactive to stimulation. There was no sleep architecture identified. There was EMG artifact throughout the recording in the bilateral anterior leads. There was no focal slowing. There were no interictal epileptiform discharges. There were no electrographic seizures identified. Photic stimulation and hyperventilation were not performed.  Impression and clinical correlation: This EEG was obtained while comatose and is abnormal due to severe diffuse slowing indicative of global cerebral dysfunciton.   Bing Neighbors, MD [facility] [phone]  If 7pm- 7am, please page neurology on call as listed in AMION.

## 2021-04-30 NOTE — ED Notes (Signed)
Epidrip at 10 mcg min

## 2021-04-30 NOTE — Progress Notes (Signed)
Pt is requiring higher amount of vasopressor support and having increased arrhythmias. RN called Marylu Lund and Gary Fleet and made them aware. Family at bedside along with Lorin Picket, NP. Goals of care are discussed and family Olegario Messier and Marylu Lund are in agreement that they would like to proceed with Comfort Care measures. Dr. Silvestre Gunner present at bedside as well during this conversation.

## 2021-04-30 NOTE — Progress Notes (Signed)
  Interdisciplinary Goals of Care Family Meeting   Date carried out:: 04/22/2021  Location of the meeting: Bedside  Member's involved: Nurse Practitioner, Bedside Registered Nurse, Family Member or next of kin, and Other: Jimmy Cain RN  Durable Power of Attorney or acting medical decision maker: [name] and [name]      Discussion: Was called to the bedside to speak with family in the setting of increasing pressor requirements and poor neurological exam after cardiac arrest with 45 minutes of downtime prior to ROSC. Met with [name] and [name] at bedside. We discussed goals of care for [name] and his current clinical status. [name] states that Adi has a living will that they do not know the contents of, however he would not want to suffer like this.The feel that we are prolonging his suffering and he has lived a great life. He would want to be made comfortable so that he could be with [name]. We discussed starting [name] on medications for pain and anxiety and removing ventilatory support to allow for a natural death.  [name] was called on the phone and this was also explained to her. She is in agreance with [name]'s wishes and this plan. This was confirmed over the phone by [name].  The patient will be made comfort care  Code status: Full DNR  Disposition: In-patient comfort care  Time spent for the meeting: 20 minutes  [name] 2021-04-22, 10:24 PM

## 2021-04-30 NOTE — Consult Note (Signed)
Urology Consult  Referring physician: Dr. Lynetta Mare Reason for referral: Difficult foley placement  Chief Complaint: urinary retention  History of Present Illness: [name] is a 87yo admitted with cardiac arrest and concern for sepsis. He is currently intubated and sedated. Multiple attempts were made by the nursing staff to place a foley which were unsuccessful. No prior BPH history.  Past Medical History:  Diagnosis Date   Anticoagulant disorder ([place])    ASCVD (arteriosclerotic cardiovascular disease)    CABG 11/06; MUGA 11/08 - 44%; 3/07 mildly decreased EF on echo   Atrial fibrillation ([facility])    Initially occurred following CABG; event recorder in 11/08 - no AF; recurred 08/2009   CHF (congestive heart failure) ([place])    Coronary artery disease    History of kidney stones    HOH (hard of hearing)    Hyperlipidemia    Hypertension    Myocardial infarction ([place])    Nephrolithiasis    Overweight(278.02)    Past Surgical History:  Procedure Laterality Date   ANGIOPLASTY  1991   CATARACT EXTRACTION W/PHACO Right 02/22/2018   Procedure: CATARACT EXTRACTION PHACO AND INTRAOCULAR LENS PLACEMENT RIGHT EYE CDE=16.62;  Surgeon: Tonny Branch, MD;  Location: [facility];  Service: Ophthalmology;  Laterality: Right;  right   CATARACT EXTRACTION W/PHACO Left 03/29/2018   Procedure: CATARACT EXTRACTION PHACO AND INTRAOCULAR LENS PLACEMENT (IOC);  Surgeon: Tonny Branch, MD;  Location: [facility];  Service: Ophthalmology;  Laterality: Left;  CDE: 16.37   COLONOSCOPY  2008   CORONARY ARTERY BYPASS GRAFT  04/2005    Medications: I have reviewed the patient's current medications. Allergies:  Allergies  Allergen Reactions   Ibuprofen Rash and Other (See Comments)    Skin rash    Family History  Problem Relation Age of Onset   Hypertension Father    Heart attack Father    Diabetes Brother    Social History:  reports that he has never smoked. He has never used smokeless tobacco. He reports that he does  not drink alcohol and does not use drugs.  Review of Systems  Unable to perform ROS: Intubated   Physical Exam:  Vital signs in last 24 hours: Temp:  [96.5 F (35.8 C)-97.8 F (36.6 C)] 96.5 F (35.8 C) (10/01 1927) Pulse Rate:  [36-102] 69 (10/01 2245) Resp:  [12-28] 21 (10/01 2245) BP: (67-171)/(31-86) 115/62 (10/01 2245) SpO2:  [82 %-100 %] 100 % (10/01 2245) FiO2 (%):  [90 %-100 %] 90 % (10/01 2000) Weight:  [104.7 kg] 104.7 kg (10/01 1830) Physical Exam Vitals reviewed.  Constitutional:      Appearance: Normal appearance.  HENT:     Head: Normocephalic and atraumatic.     Nose: Nose normal.  Cardiovascular:     Rate and Rhythm: Normal rate.  Pulmonary:     Effort: No respiratory distress.  Abdominal:     General: Abdomen is flat. There is distension.  Genitourinary:    Penis: Normal.      Testes:        Left: Testicular hydrocele present.  Skin:    General: Skin is warm and dry.    Laboratory Data:  Results for orders placed or performed during the hospital encounter of 04-04-2021 (from the past 72 hour(s))  Lactic acid, plasma     Status: Abnormal   Collection Time: 04/04/21  1:11 PM  Result Value Ref Range   Lactic Acid, Venous 6.3 (HH) 0.5 - 1.9 mmol/L    Comment: CRITICAL RESULT CALLED TO,  READ BACK BY AND VERIFIED WITH: K BONEY RN BY SSTEPHENS 1422 [id number] Performed at [facility], [place] [address], [place], [place] [address]   Troponin I (High Sensitivity)     Status: Abnormal   Collection Time: 2021-04-09  1:17 PM  Result Value Ref Range   Troponin I (High Sensitivity) 832 (HH) <18 ng/L    Comment: CRITICAL RESULT CALLED TO, READ BACK BY AND VERIFIED WITH: K BONEY RN BY SSTEPHENS 1422 [id number] (NOTE) Elevated high sensitivity troponin I (hsTnI) values and significant  changes across serial measurements may suggest ACS but many other  chronic and acute conditions are known to elevate hsTnI results.  Refer to the Links section for chest pain algorithms  and additional  guidance. Performed at [facility], [place] [address] [place], [place] [address]   CBC     Status: Abnormal   Collection Time: April 09, 2021  1:17 PM  Result Value Ref Range   WBC 31.5 (H) 4.0 - 10.5 K/uL   RBC 4.14 (L) 4.22 - 5.81 MIL/uL   Hemoglobin 13.9 13.0 - 17.0 g/dL   HCT 43.7 39.0 - 52.0 %   MCV 105.6 (H) 80.0 - 100.0 fL   MCH 33.6 26.0 - 34.0 pg   MCHC 31.8 30.0 - 36.0 g/dL   RDW 14.1 11.5 - 15.5 %   Platelets 195 150 - 400 K/uL   nRBC 0.1 0.0 - 0.2 %    Comment: Performed at [facility], [place] [address] [place], [place] [address]  Comprehensive metabolic panel     Status: Abnormal   Collection Time: 04-09-21  1:17 PM  Result Value Ref Range   Sodium 138 135 - 145 mmol/L   Potassium 3.8 3.5 - 5.1 mmol/L   Chloride 108 98 - 111 mmol/L   CO2 16 (L) 22 - 32 mmol/L   Glucose, Bld 193 (H) 70 - 99 mg/dL    Comment: Glucose reference range applies only to samples taken after fasting for at least 8 hours.   BUN 19 8 - 23 mg/dL   Creatinine, Ser 1.20 0.61 - 1.24 mg/dL   Calcium 8.0 (L) 8.9 - 10.3 mg/dL   Total Protein 5.7 (L) 6.5 - 8.1 g/dL   Albumin 2.9 (L) 3.5 - 5.0 g/dL   AST 223 (H) 15 - 41 U/L   ALT 242 (H) 0 - 44 U/L   Alkaline Phosphatase 116 38 - 126 U/L   Total Bilirubin 0.9 0.3 - 1.2 mg/dL   GFR, Estimated 58 (L) >60 mL/min    Comment: (NOTE) Calculated using the CKD-EPI Creatinine Equation (2021)    Anion gap 14 5 - 15    Comment: Performed at [facility], [place] [address], [place], [place] [address]  Magnesium     Status: None   Collection Time: 2021/04/09  1:17 PM  Result Value Ref Range   Magnesium 2.1 1.7 - 2.4 mg/dL    Comment: Performed at [facility], [place] [address], [place], [place] [address]  D-dimer, quantitative     Status: Abnormal   Collection Time: Apr 09, 2021  1:17 PM  Result Value Ref Range   D-Dimer, Quant >20.00 (H) 0.00 - 0.50 ug/mL-FEU    Comment: (NOTE) At the manufacturer cut-off value of 0.5 g/mL  FEU, this assay has a negative predictive value of 95-100%.This assay is intended for use in conjunction with a clinical pretest probability (PTP) assessment model to exclude pulmonary embolism (PE) and deep  venous thrombosis (DVT) in outpatients suspected of PE or DVT. Results should be correlated with clinical presentation. Performed at [facility], [place] [address] [place], [place] [address]   I-Stat Chem 8, ED     Status: Abnormal   Collection Time: 2021-04-07  1:23 PM  Result Value Ref Range   Sodium 140 135 - 145 mmol/L   Potassium 3.8 3.5 - 5.1 mmol/L   Chloride 108 98 - 111 mmol/L   BUN 25 (H) 8 - 23 mg/dL   Creatinine, Ser 1.00 0.61 - 1.24 mg/dL   Glucose, Bld 186 (H) 70 - 99 mg/dL    Comment: Glucose reference range applies only to samples taken after fasting for at least 8 hours.   Calcium, Ion 1.04 (L) 1.15 - 1.40 mmol/L   TCO2 21 (L) 22 - 32 mmol/L   Hemoglobin 13.3 13.0 - 17.0 g/dL   HCT 39.0 39.0 - 52.0 %  Resp Panel by RT-PCR (Flu A&B, Covid) Nasopharyngeal Swab     Status: None   Collection Time: 2021/04/07  1:29 PM   Specimen: Nasopharyngeal Swab; Nasopharyngeal(NP) swabs in vial transport medium  Result Value Ref Range   SARS Coronavirus 2 by RT PCR NEGATIVE NEGATIVE    Comment: (NOTE) SARS-CoV-2 target nucleic acids are NOT DETECTED.  The SARS-CoV-2 RNA is generally detectable in upper respiratory specimens during the acute phase of infection. The lowest concentration of SARS-CoV-2 viral copies this assay can detect is 138 copies/mL. A negative result does not preclude SARS-Cov-2 infection and should not be used as the sole basis for treatment or other patient management decisions. A negative result may occur with  improper specimen collection/handling, submission of specimen other than nasopharyngeal swab, presence of viral mutation(s) within the areas targeted by this assay, and inadequate number of viral copies(<138 copies/mL). A negative result must  be combined with clinical observations, patient history, and epidemiological information. The expected result is Negative.  Fact Sheet for Patients:  EntrepreneurPulse.com.au  Fact Sheet for Healthcare Providers:  IncredibleEmployment.be  This test is no t yet approved or cleared by the [place] FDA and  has been authorized for detection and/or diagnosis of SARS-CoV-2 by FDA under an Emergency Use Authorization (EUA). This EUA will remain  in effect (meaning this test can be used) for the duration of the COVID-19 declaration under Section 564(b)(1) of the Act, 21 U.S.C.section 360bbb-3(b)(1), unless the authorization is terminated  or revoked sooner.       Influenza A by PCR NEGATIVE NEGATIVE   Influenza B by PCR NEGATIVE NEGATIVE    Comment: (NOTE) The Xpert Xpress SARS-CoV-2/FLU/RSV plus assay is intended as an aid in the diagnosis of influenza from Nasopharyngeal swab specimens and should not be used as a sole basis for treatment. Nasal washings and aspirates are unacceptable for Xpert Xpress SARS-CoV-2/FLU/RSV testing.  Fact Sheet for Patients: EntrepreneurPulse.com.au  Fact Sheet for Healthcare Providers: IncredibleEmployment.be  This test is not yet approved or cleared by the [place] FDA and has been authorized for detection and/or diagnosis of SARS-CoV-2 by FDA under an Emergency Use Authorization (EUA). This EUA will remain in effect (meaning this test can be used) for the duration of the COVID-19 declaration under Section 564(b)(1) of the Act, 21 U.S.C. section 360bbb-3(b)(1), unless the authorization is terminated or revoked.  Performed at [facility], [place] [address], [place], [place] [address]   I-Stat arterial blood gas, [facility] ED)     Status: Abnormal   Collection Time:  04/01/2021  2:35 PM  Result Value Ref Range   pH, Arterial 7.284 (L) 7.350 - 7.450   pCO2 arterial 33.1  32.0 - 48.0 mmHg   pO2, Arterial 108 83.0 - 108.0 mmHg   Bicarbonate 15.7 (L) 20.0 - 28.0 mmol/L   TCO2 17 (L) 22 - 32 mmol/L   O2 Saturation 98.0 %   Acid-base deficit 10.0 (H) 0.0 - 2.0 mmol/L   Sodium 140 135 - 145 mmol/L   Potassium 3.4 (L) 3.5 - 5.1 mmol/L   Calcium, Ion 1.23 1.15 - 1.40 mmol/L   HCT 35.0 (L) 39.0 - 52.0 %   Hemoglobin 11.9 (L) 13.0 - 17.0 g/dL   Patient temperature 98.6 F    Collection site Radial    Drawn by RT    Sample type ARTERIAL   Lactic acid, plasma     Status: Abnormal   Collection Time: Apr 01, 2021  4:38 PM  Result Value Ref Range   Lactic Acid, Venous 8.1 (HH) 0.5 - 1.9 mmol/L    Comment: CRITICAL VALUE NOTED.  VALUE IS CONSISTENT WITH PREVIOUSLY REPORTED AND CALLED VALUE. Performed at [facility], [place] [address], [place], [place] [address]   Protime-INR     Status: Abnormal   Collection Time: 04-01-2021  4:38 PM  Result Value Ref Range   Prothrombin Time 42.7 (H) 11.4 - 15.2 seconds   INR 4.5 (HH) 0.8 - 1.2    Comment: REPEATED TO VERIFY CRITICAL RESULT CALLED TO, READ BACK BY AND VERIFIED WITH: [employer] RN 1826 2021-04-01  LIN AUNG  (NOTE) INR goal varies based on device and disease states. Performed at [facility], [place] [address], [place], [place] [address]   Troponin I (High Sensitivity)     Status: Abnormal   Collection Time: 04/01/2021  4:38 PM  Result Value Ref Range   Troponin I (High Sensitivity) 2,996 (HH) <18 ng/L    Comment: CRITICAL VALUE NOTED.  VALUE IS CONSISTENT WITH PREVIOUSLY REPORTED AND CALLED VALUE. (NOTE) Elevated high sensitivity troponin I (hsTnI) values and significant  changes across serial measurements may suggest ACS but many other  chronic and acute conditions are known to elevate hsTnI results.  Refer to the Links section for chest pain algorithms and additional  guidance. Performed at [facility], [place] [address], [place], [place] [address]   CBC     Status: Abnormal   Collection Time:  2021/04/01  4:38 PM  Result Value Ref Range   WBC 28.1 (H) 4.0 - 10.5 K/uL   RBC 3.70 (L) 4.22 - 5.81 MIL/uL   Hemoglobin 12.5 (L) 13.0 - 17.0 g/dL   HCT 37.9 (L) 39.0 - 52.0 %   MCV 102.4 (H) 80.0 - 100.0 fL   MCH 33.8 26.0 - 34.0 pg   MCHC 33.0 30.0 - 36.0 g/dL   RDW 14.2 11.5 - 15.5 %   Platelets 195 150 - 400 K/uL   nRBC 0.1 0.0 - 0.2 %    Comment: Performed at [facility], [place] [address] [place], [place] [address]  Creatinine, serum     Status: Abnormal   Collection Time: 04/01/21  4:38 PM  Result Value Ref Range   Creatinine, Ser 1.52 (H) 0.61 - 1.24 mg/dL   GFR, Estimated 44 (L) >60 mL/min    Comment: (NOTE) Calculated using the CKD-EPI Creatinine Equation (2021) Performed at [place] [facility] [address], [place], [place] [address]   .Cooxemetry Panel (carboxy, met,  total hgb, O2 sat)     Status: None   Collection Time: 04-02-2021  4:38 PM  Result Value Ref Range   Total hemoglobin 12.7 12.0 - 16.0 g/dL   O2 Saturation 52.5 %   Carboxyhemoglobin 0.5 0.5 - 1.5 %   Methemoglobin 0.8 0.0 - 1.5 %    Comment: Performed at [place] [address] [place], [place] [address]  Procalcitonin - Baseline     Status: None   Collection Time: Apr 02, 2021  4:38 PM  Result Value Ref Range   Procalcitonin 0.85 ng/mL    Comment:        Interpretation: PCT > 0.5 ng/mL and <= 2 ng/mL: Systemic infection (sepsis) is possible, but other conditions are known to elevate PCT as well. (NOTE)       Sepsis PCT Algorithm           Lower Respiratory Tract                                      Infection PCT Algorithm    ----------------------------     ----------------------------         PCT < 0.25 ng/mL                PCT < 0.10 ng/mL          Strongly encourage             Strongly discourage   discontinuation of antibiotics    initiation of antibiotics    ----------------------------     -----------------------------       PCT 0.25 - 0.50 ng/mL            PCT 0.10 -  0.25 ng/mL               OR       >80% decrease in PCT            Discourage initiation of                                            antibiotics      Encourage discontinuation           of antibiotics    ----------------------------     -----------------------------         PCT >= 0.50 ng/mL              PCT 0.26 - 0.50 ng/mL                AND       <80% decrease in PCT             Encourage initiation of                                             antibiotics       Encourage continuation           of antibiotics    ----------------------------     -----------------------------        PCT >= 0.50 ng/mL                  PCT > 0.50 ng/mL  AND         increase in PCT                  Strongly encourage                                      initiation of antibiotics    Strongly encourage escalation           of antibiotics                                     -----------------------------                                           PCT <= 0.25 ng/mL                                                 OR                                        > 80% decrease in PCT                                      Discontinue / Do not initiate                                             antibiotics  Performed at [facility], [address] [place], [place] [address]   MRSA Next Gen by PCR, Nasal     Status: None   Collection Time: 04-19-21  5:23 PM   Specimen: Nasal Mucosa; Nasal Swab  Result Value Ref Range   MRSA by PCR Next Gen NOT DETECTED NOT DETECTED    Comment: (NOTE) The GeneXpert MRSA Assay (FDA approved for NASAL specimens only), is one component of a comprehensive MRSA colonization surveillance program. It is not intended to diagnose MRSA infection nor to guide or monitor treatment for MRSA infections. Test performance is not FDA approved in patients less than 53 years old. Performed at [facility], [place] [address], [place], [place] [address]    Recent  Results (from the past 240 hour(s))  Resp Panel by RT-PCR (Flu A&B, Covid) Nasopharyngeal Swab     Status: None   Collection Time: 19-Apr-2021  1:29 PM   Specimen: Nasopharyngeal Swab; Nasopharyngeal(NP) swabs in vial transport medium  Result Value Ref Range Status   SARS Coronavirus 2 by RT PCR NEGATIVE NEGATIVE Final    Comment: (NOTE) SARS-CoV-2 target nucleic acids are NOT DETECTED.  The SARS-CoV-2 RNA is generally detectable in upper respiratory specimens during the acute phase of infection. The lowest concentration of SARS-CoV-2 viral copies this assay can detect is 138 copies/mL. A negative result does not preclude SARS-Cov-2 infection and should not be used as the  sole basis for treatment or other patient management decisions. A negative result may occur with  improper specimen collection/handling, submission of specimen other than nasopharyngeal swab, presence of viral mutation(s) within the areas targeted by this assay, and inadequate number of viral copies(<138 copies/mL). A negative result must be combined with clinical observations, patient history, and epidemiological information. The expected result is Negative.  Fact Sheet for Patients:  EntrepreneurPulse.com.au  Fact Sheet for Healthcare Providers:  IncredibleEmployment.be  This test is no t yet approved or cleared by the [place] FDA and  has been authorized for detection and/or diagnosis of SARS-CoV-2 by FDA under an Emergency Use Authorization (EUA). This EUA will remain  in effect (meaning this test can be used) for the duration of the COVID-19 declaration under Section 564(b)(1) of the Act, 21 U.S.C.section 360bbb-3(b)(1), unless the authorization is terminated  or revoked sooner.       Influenza A by PCR NEGATIVE NEGATIVE Final   Influenza B by PCR NEGATIVE NEGATIVE Final    Comment: (NOTE) The Xpert Xpress SARS-CoV-2/FLU/RSV plus assay is intended as an aid in the  diagnosis of influenza from Nasopharyngeal swab specimens and should not be used as a sole basis for treatment. Nasal washings and aspirates are unacceptable for Xpert Xpress SARS-CoV-2/FLU/RSV testing.  Fact Sheet for Patients: EntrepreneurPulse.com.au  Fact Sheet for Healthcare Providers: IncredibleEmployment.be  This test is not yet approved or cleared by the [place] FDA and has been authorized for detection and/or diagnosis of SARS-CoV-2 by FDA under an Emergency Use Authorization (EUA). This EUA will remain in effect (meaning this test can be used) for the duration of the COVID-19 declaration under Section 564(b)(1) of the Act, 21 U.S.C. section 360bbb-3(b)(1), unless the authorization is terminated or revoked.  Performed at [facility], [place] [address] [place], [place] [address]   MRSA Next Gen by PCR, Nasal     Status: None   Collection Time: 04/18/21  5:23 PM   Specimen: Nasal Mucosa; Nasal Swab  Result Value Ref Range Status   MRSA by PCR Next Gen NOT DETECTED NOT DETECTED Final    Comment: (NOTE) The GeneXpert MRSA Assay (FDA approved for NASAL specimens only), is one component of a comprehensive MRSA colonization surveillance program. It is not intended to diagnose MRSA infection nor to guide or monitor treatment for MRSA infections. Test performance is not FDA approved in patients less than 58 years old. Performed at [facility], [place] [address] [place], [place] [address]    Creatinine: Recent Labs    04/18/21 1317 18-Apr-2021 1323 2021/04/18 1638  CREATININE 1.20 1.00 1.52*   Baseline Creatinine: 1  Impression/Assessment:  87yo with BPh and difficult foley placement  Plan:  16 french foley placed without incident ant 20cc of clear urine drained. The foley was flushed with 200cc NS and only 20cc drained. Continue foley while in ICU  Nicolette Bang 04-18-2021, 11:10 PM

## 2021-04-30 NOTE — Progress Notes (Signed)
ANTICOAGULATION CONSULT NOTE - Initial Consult  Pharmacy Consult for Heparin Indication: chest pain/ACS  Allergies  Allergen Reactions   Ibuprofen Rash and Other (See Comments)    Skin rash    Patient Measurements: Height: 5\' 9"  (175.3 cm) Weight: 104.7 kg (230 lb 13.2 oz) IBW/kg (Calculated) : 70.7 Heparin Dosing Weight: 93 kg  Vital Signs: Temp: 97.8 F (36.6 C) (10/01 1630) Temp Source: Oral (10/01 1630) BP: 92/48 (10/01 1830) Pulse Rate: 55 (10/01 1830)  Labs: Recent Labs    04-Apr-2021 1317 Apr 04, 2021 1323 04/04/2021 1435 Apr 04, 2021 1638  HGB 13.9 13.3 11.9* 12.5*  HCT 43.7 39.0 35.0* 37.9*  PLT 195  --   --  195  LABPROT  --   --   --  42.7*  INR  --   --   --  4.5*  CREATININE 1.20 1.00  --   --   TROPONINIHS 832*  --   --   --     Estimated Creatinine Clearance: 60.9 mL/min (by C-G formula based on SCr of 1 mg/dL).   Medical History: Past Medical History:  Diagnosis Date   Anticoagulant disorder ([facility])    ASCVD (arteriosclerotic cardiovascular disease)    CABG 11/06; MUGA 11/08 - 44%; 3/07 mildly decreased EF on echo   Atrial fibrillation ([facility])    Initially occurred following CABG; event recorder in 11/08 - no AF; recurred 08/2009   CHF (congestive heart failure) (H[facility])    Coronary artery disease    History of kidney stones    HOH (hard of hearing)    Hyperlipidemia    Hypertension    Myocardial infarction ([facility])    Nephrolithiasis    Overweight(278.02)      Assessment: 87 yo M with out-of-hospital VT/VF cardiac arrest s/p ACLS and ROSC. Trop 83.  Patient was on warfarin at home for afib (CHADS2VASc = 5). Pharmacy consulted for heparin for ACS.    INR pending as of 16:40. Lab in room to draw stat. Last available INR 8/22 was 2.4 so will not start heparin until current INR results.  INR is 4.5. Per lab, INR is not affected by lactic acidosis. H/H, plt stable. Will hold heparin until INR is less than 2.  PTA warfarin dose: 5mg  on Mon/Thurs, 2.5mg  all  other days (last ACC visit 8/22)   Goal of Therapy:  Heparin level 0.3-0.7 units/ml Monitor platelets by anticoagulation protocol: Yes   Plan:   Hold heparin until INR is less than 2 Monitor daily INR, CBC/plt Monitor for signs/symptoms of bleeding    , PharmD, BCPS, BCCP Clinical Pharmacist  Please check AMION for all [facility] phone numbers After 10:00 PM, call Main Pharmacy [phone]

## 2021-04-30 NOTE — ED Notes (Addendum)
Watch, wallet, glasses, dentures, clothing, phone and other pt belonging given to [employer], [place]

## 2021-04-30 NOTE — Consult Note (Signed)
CONSULTATION NOTE   Patient Name: [name] Date of Encounter: April 13, 2021 Cardiologist: Dina Rich, MD Electrophysiologist: None Advanced Heart Failure: None   Chief Complaint   Intubated, cardiac arrest  Patient Profile   87 yo male with history of CABG, remote MI, ischemic CHF with LVEF 45-50%, presents today for cardiac arrest after complaining of chest pain  HPI   [name] is a 87 y.o. male who is being seen today for the evaluation of cardiac arrest at the request of Dr. Lynelle Doctor. This is an 87 year old male patient followed by Dr. Wyline Mood with a history of coronary artery disease status post CABG in 2006.  Last echo in 2017 showed LVEF 45 to 50%, severe LVH, apical hypokinesis, he also has a history of atrial fibrillation and has been maintained on warfarin.  According to EMS prior to arrival he had complained of some chest pain and the patient was noted to be in VT arrest.  He underwent ACLS protocol and received compressions, shock and amiodarone with CPR in progress on arrival.  The patient was intubated and eventually ROSC was obtained.  The patient was placed on an epinephrine drip.  Total downtime was unclear.  Initial lab work showed a high troponin of 832, lactic acid of 6.3, marked leukocytosis at 31,500 and elevated D-dimer.  CT chest was negative for pulmonary embolus but did demonstrate rib fractures and signs of possible aspiration pneumonitis.  Cardiology was consulted for additional cardiac management.  EKG, which is personally reviewed shows atrial fibrillation with an IVCD but no clear ST elevation.  Old inferior infarct is noted with Q waves in 2, 3 and aVF.  PMHx   Past Medical History:  Diagnosis Date   Anticoagulant disorder (HCC)    ASCVD (arteriosclerotic cardiovascular disease)    CABG 11/06; MUGA 11/08 - 44%; 3/07 mildly decreased EF on echo   Atrial fibrillation (HCC)    Initially occurred following CABG; event recorder in 11/08 - no AF;  recurred 08/2009   CHF (congestive heart failure) (HCC)    Coronary artery disease    History of kidney stones    HOH (hard of hearing)    Hyperlipidemia    Hypertension    Myocardial infarction (HCC)    Nephrolithiasis    Overweight(278.02)     Past Surgical History:  Procedure Laterality Date   ANGIOPLASTY  1991   CATARACT EXTRACTION W/PHACO Right 02/22/2018   Procedure: CATARACT EXTRACTION PHACO AND INTRAOCULAR LENS PLACEMENT RIGHT EYE CDE=16.62;  Surgeon: Gemma Payor, MD;  Location: [facility];  Service: Ophthalmology;  Laterality: Right;  right   CATARACT EXTRACTION W/PHACO Left 03/29/2018   Procedure: CATARACT EXTRACTION PHACO AND INTRAOCULAR LENS PLACEMENT (IOC);  Surgeon: Gemma Payor, MD;  Location: [facility];  Service: Ophthalmology;  Laterality: Left;  CDE: 16.37   COLONOSCOPY  2008   CORONARY ARTERY BYPASS GRAFT  04/2005    FAMHx   Family History  Problem Relation Age of Onset   Hypertension Father    Heart attack Father    Diabetes Brother     SOCHx    reports that he has never smoked. He has never used smokeless tobacco. He reports that he does not drink alcohol and does not use drugs.  Outpatient Medications   No current facility-administered medications on file prior to encounter.   Current Outpatient Medications on File Prior to Encounter  Medication Sig Dispense Refill   amLODipine-benazepril (LOTREL) 10-40 MG capsule TAKE 1 CAPSULE BY MOUTH ONCE DAILY. (  Patient taking differently: Take 1 capsule by mouth daily.) 30 capsule 6   atorvastatin (LIPITOR) 80 MG tablet TAKE 1 TABLET ONCE DAILY. (Patient taking differently: Take 80 mg by mouth daily.) 90 tablet 3   furosemide (LASIX) 20 MG tablet TAKE 1 TABLET ONCE DAILY. (Patient taking differently: Take 20 mg by mouth daily.) 90 tablet 3   metoprolol succinate (TOPROL-XL) 50 MG 24 hr tablet TAKE 1 TABLET ONCE A DAY IMMEDIATELY FOLLOWING A MEAL. (Patient taking differently: Take 50 mg by mouth daily. Take immediately  following a meal) 30 tablet 11   warfarin (COUMADIN) 5 MG tablet Take 1/2 tablet daily  except 1 tablet on Mondays and Thursdays or as directed (Patient taking differently: Take 5 mg by mouth See admin instructions. Take 1 tablet (5 mg) by mouth on Monday and Thursday, take 1/2 tablet (2.5 mg) on Sunday, Tuesday, Wednesday, Friday, Saturday or as directed by anti-coag clinic.) 30 tablet 5    Inpatient Medications    Scheduled Meds:  fentaNYL (SUBLIMAZE) injection  100 mcg Intravenous Once   fentaNYL (SUBLIMAZE) injection  25 mcg Intravenous Once   heparin  5,000 Units Subcutaneous Q8H   midazolam  1 mg Intravenous Once   pantoprazole (PROTONIX) IV  40 mg Intravenous QHS   sodium bicarbonate  50 mEq Intravenous Once    Continuous Infusions:  sodium chloride     amiodarone 60 mg/hr (April 12, 2021 1327)   amiodarone     cefTRIAXone (ROCEPHIN)  IV     epinephrine 20 mcg/min (April 12, 2021 1411)   fentaNYL infusion INTRAVENOUS     midazolam     norepinephrine (LEVOPHED) Adult infusion 8 mcg/min (04/12/21 1606)   vasopressin 0.03 Units/min (Apr 12, 2021 1502)    PRN Meds: calcium chloride, docusate, EPINEPHrine, EPINEPHrine, fentaNYL, midazolam, polyethylene glycol   ALLERGIES   Allergies  Allergen Reactions   Ibuprofen Rash and Other (See Comments)    Skin rash    ROS   Pertinent items noted in HPI and remainder of comprehensive ROS otherwise negative.  Vitals   Vitals:   04/12/21 1525 04/12/21 1535 04/12/21 1545 04-12-2021 1550  BP:  (!) 87/45 (!) 86/56 (!) 86/31  Pulse: 87 (!) 102 63 74  Resp: (!) 27 (!) 24 (!) 24 (!) 24  Temp:    (!) 97.4 F (36.3 C)  TempSrc:    Rectal  SpO2: 100% 100% 100% 100%  Height:       No intake or output data in the 24 hours ending 2021-04-12 1615 There were no vitals filed for this visit.  Physical Exam   General appearance: Intubated on the ventilator Neck: Thick neck, no JVD, right IJ triple-lumen catheter in place Lungs: diminished breath  sounds bilaterally Heart: regular rate and rhythm Abdomen: Obese Extremities: edema trace to 1+ Pulses: Weak distal pulses, cool extremities Skin: Pale Neurologic: Mental status: Intubated on mechanical ventilator, notable decorticate posturing Psych: Not assessed  Labs   Results for orders placed or performed during the hospital encounter of April 12, 2021 (from the past 48 hour(s))  Lactic acid, plasma     Status: Abnormal   Collection Time: 04-12-21  1:11 PM  Result Value Ref Range   Lactic Acid, Venous 6.3 (HH) 0.5 - 1.9 mmol/L    Comment: CRITICAL RESULT CALLED TO, READ BACK BY AND VERIFIED WITH: K BONEY RN BY SSTEPHENS 1422 [id number] Performed at [facility], [address], [place], [place] [address]   Troponin I (High Sensitivity)     Status: Abnormal  Collection Time: 04/04/2021  1:17 PM  Result Value Ref Range   Troponin I (High Sensitivity) 832 (HH) <18 ng/L    Comment: CRITICAL RESULT CALLED TO, READ BACK BY AND VERIFIED WITH: K BONEY RN BY SSTEPHENS 1422 [id number] (NOTE) Elevated high sensitivity troponin I (hsTnI) values and significant  changes across serial measurements may suggest ACS but many other  chronic and acute conditions are known to elevate hsTnI results.  Refer to the Links section for chest pain algorithms and additional  guidance. Performed at [facility], [address] [place], [place] [address]   CBC     Status: Abnormal   Collection Time: 04-04-2021  1:17 PM  Result Value Ref Range   WBC 31.5 (H) 4.0 - 10.5 K/uL   RBC 4.14 (L) 4.22 - 5.81 MIL/uL   Hemoglobin 13.9 13.0 - 17.0 g/dL   HCT 60.4 54.0 - 98.1 %   MCV 105.6 (H) 80.0 - 100.0 fL   MCH 33.6 26.0 - 34.0 pg   MCHC 31.8 30.0 - 36.0 g/dL   RDW 19.1 47.8 - 29.5 %   Platelets 195 150 - 400 K/uL   nRBC 0.1 0.0 - 0.2 %    Comment: Performed at [facility], [address], [place], [place] [address]  Comprehensive metabolic panel     Status: Abnormal   Collection Time: 2021-04-04   1:17 PM  Result Value Ref Range   Sodium 138 135 - 145 mmol/L   Potassium 3.8 3.5 - 5.1 mmol/L   Chloride 108 98 - 111 mmol/L   CO2 16 (L) 22 - 32 mmol/L   Glucose, Bld 193 (H) 70 - 99 mg/dL    Comment: Glucose reference range applies only to samples taken after fasting for at least 8 hours.   BUN 19 8 - 23 mg/dL   Creatinine, Ser 8.65 0.61 - 1.24 mg/dL   Calcium 8.0 (L) 8.9 - 10.3 mg/dL   Total Protein 5.7 (L) 6.5 - 8.1 g/dL   Albumin 2.9 (L) 3.5 - 5.0 g/dL   AST 784 (H) 15 - 41 U/L   ALT 242 (H) 0 - 44 U/L   Alkaline Phosphatase 116 38 - 126 U/L   Total Bilirubin 0.9 0.3 - 1.2 mg/dL   GFR, Estimated 58 (L) >60 mL/min    Comment: (NOTE) Calculated using the CKD-EPI Creatinine Equation (2021)    Anion gap 14 5 - 15    Comment: Performed at [facility], [address], [place], [place] [address]  Magnesium     Status: None   Collection Time: 04-04-2021  1:17 PM  Result Value Ref Range   Magnesium 2.1 1.7 - 2.4 mg/dL    Comment: Performed at [facility], [address], [place], [place] [address]  D-dimer, quantitative     Status: Abnormal   Collection Time: 04/04/21  1:17 PM  Result Value Ref Range   D-Dimer, Quant >20.00 (H) 0.00 - 0.50 ug/mL-FEU    Comment: (NOTE) At the manufacturer cut-off value of 0.5 g/mL FEU, this assay has a negative predictive value of 95-100%.This assay is intended for use in conjunction with a clinical pretest probability (PTP) assessment model to exclude pulmonary embolism (PE) and deep venous thrombosis (DVT) in outpatients suspected of PE or DVT. Results should be correlated with clinical presentation. Performed at [facility], [address], [place], [place] [address]   I-Stat Chem 8, ED     Status:  Abnormal   Collection Time: 2021/04/27  1:23 PM  Result Value Ref Range   Sodium 140 135 - 145 mmol/L   Potassium 3.8 3.5 - 5.1 mmol/L   Chloride 108 98 - 111 mmol/L   BUN 25 (H) 8 - 23 mg/dL   Creatinine, Ser 9.98 0.61 -  1.24 mg/dL   Glucose, Bld 338 (H) 70 - 99 mg/dL    Comment: Glucose reference range applies only to samples taken after fasting for at least 8 hours.   Calcium, Ion 1.04 (L) 1.15 - 1.40 mmol/L   TCO2 21 (L) 22 - 32 mmol/L   Hemoglobin 13.3 13.0 - 17.0 g/dL   HCT 25.0 53.9 - 76.7 %  Resp Panel by RT-PCR (Flu A&B, Covid) Nasopharyngeal Swab     Status: None   Collection Time: Apr 27, 2021  1:29 PM   Specimen: Nasopharyngeal Swab; Nasopharyngeal(NP) swabs in vial transport medium  Result Value Ref Range   SARS Coronavirus 2 by RT PCR NEGATIVE NEGATIVE    Comment: (NOTE) SARS-CoV-2 target nucleic acids are NOT DETECTED.  The SARS-CoV-2 RNA is generally detectable in upper respiratory specimens during the acute phase of infection. The lowest concentration of SARS-CoV-2 viral copies this assay can detect is 138 copies/mL. A negative result does not preclude SARS-Cov-2 infection and should not be used as the sole basis for treatment or other patient management decisions. A negative result may occur with  improper specimen collection/handling, submission of specimen other than nasopharyngeal swab, presence of viral mutation(s) within the areas targeted by this assay, and inadequate number of viral copies(<138 copies/mL). A negative result must be combined with clinical observations, patient history, and epidemiological information. The expected result is Negative.  Fact Sheet for Patients:  BloggerCourse.com  Fact Sheet for Healthcare Providers:  SeriousBroker.it  This test is no t yet approved or cleared by the [place] FDA and  has been authorized for detection and/or diagnosis of SARS-CoV-2 by FDA under an Emergency Use Authorization (EUA). This EUA will remain  in effect (meaning this test can be used) for the duration of the COVID-19 declaration under Section 564(b)(1) of the Act, 21 U.S.C.section 360bbb-3(b)(1), unless the  authorization is terminated  or revoked sooner.       Influenza A by PCR NEGATIVE NEGATIVE   Influenza B by PCR NEGATIVE NEGATIVE    Comment: (NOTE) The Xpert Xpress SARS-CoV-2/FLU/RSV plus assay is intended as an aid in the diagnosis of influenza from Nasopharyngeal swab specimens and should not be used as a sole basis for treatment. Nasal washings and aspirates are unacceptable for Xpert Xpress SARS-CoV-2/FLU/RSV testing.  Fact Sheet for Patients: BloggerCourse.com  Fact Sheet for Healthcare Providers: SeriousBroker.it  This test is not yet approved or cleared by the [place] FDA and has been authorized for detection and/or diagnosis of SARS-CoV-2 by FDA under an Emergency Use Authorization (EUA). This EUA will remain in effect (meaning this test can be used) for the duration of the COVID-19 declaration under Section 564(b)(1) of the Act, 21 U.S.C. section 360bbb-3(b)(1), unless the authorization is terminated or revoked.  Performed at [facility], [address], [place], [place] [address]   I-Stat arterial blood gas, [facility] ED)     Status: Abnormal   Collection Time: 04/27/2021  2:35 PM  Result Value Ref Range   pH, Arterial 7.284 (L) 7.350 - 7.450   pCO2 arterial 33.1 32.0 - 48.0 mmHg   pO2, Arterial 108 83.0 - 108.0 mmHg   Bicarbonate 15.7 (  L) 20.0 - 28.0 mmol/L   TCO2 17 (L) 22 - 32 mmol/L   O2 Saturation 98.0 %   Acid-base deficit 10.0 (H) 0.0 - 2.0 mmol/L   Sodium 140 135 - 145 mmol/L   Potassium 3.4 (L) 3.5 - 5.1 mmol/L   Calcium, Ion 1.23 1.15 - 1.40 mmol/L   HCT 35.0 (L) 39.0 - 52.0 %   Hemoglobin 11.9 (L) 13.0 - 17.0 g/dL   Patient temperature 34.7 F    Collection site Radial    Drawn by RT    Sample type ARTERIAL     ECG   A. fib with IVCD, inferior Q waves- Personally Reviewed  Telemetry   A. fib with CVR- Personally Reviewed  Radiology   CT HEAD WO CONTRAST ( )  Result Date:  2021-04-21 CLINICAL DATA:  Cardiac arrest. EXAM: CT HEAD WITHOUT CONTRAST TECHNIQUE: Contiguous axial images were obtained from the base of the skull through the vertex without intravenous contrast. COMPARISON:  MRI brain dated June 25, 2011. FINDINGS: Brain: No evidence of acute infarction, hemorrhage, hydrocephalus, extra-axial collection or mass lesion/mass effect. Mild-to-moderate generalized cerebral atrophy. Scattered moderate periventricular and subcortical white matter hypodensities are nonspecific, but favored to reflect chronic microvascular ischemic changes. Vascular: Calcified atherosclerosis at the skullbase. No hyperdense vessel. Skull: Normal. Negative for fracture or focal lesion. Sinuses/Orbits: Small air-fluid level in the right maxillary sinus. Secretions in the posterior nasopharynx. Endotracheal and enteric tubes partially visualized. Other: None. IMPRESSION: 1. No acute intracranial abnormality. Electronically Signed   By: Obie Dredge M.D.   On: Apr 21, 2021 14:27   CT Angio Chest PE W and/or Wo Contrast  Addendum Date: 04-21-21   ADDENDUM REPORT: 21-Apr-2021 14:33 ADDENDUM: Critical Value/emergent results were called by telephone on 2021-04-21 at 2:33 pm to provider JON KNAPP , who verbally acknowledged these results. Electronically Signed   By: Allegra Lai M.D.   On: April 21, 2021 14:33   Result Date: April 21, 2021 CLINICAL DATA:  Concern for pulmonary embolus EXAM: CT ANGIOGRAPHY CHEST WITH CONTRAST TECHNIQUE: Multidetector CT imaging of the chest was performed using the standard protocol during bolus administration of intravenous contrast. Multiplanar CT image reconstructions and MIPs were obtained to evaluate the vascular anatomy. CONTRAST:  OMNIPAQUE IOHEXOL 350 MG/ML SOLN COMPARISON:  None. FINDINGS: Cardiovascular: Adequate contrast opacification of the pulmonary arteries. No evidence of pulmonary embolus. Three-vessel coronary artery calcifications. Prior CABG. Mild  cardiomegaly with no evidence of pericardial effusion. Mild stranding of the anterior mediastinal fat with no fluid collections. Mediastinum/Nodes: Enteric tube courses through the esophagus into the stomach. No pathologically enlarged lymph nodes seen in the chest. Lungs/Pleura: Bilateral bronchial wall thickening with scattered areas of endobronchial debris., most pronounced in the bilateral lower lungs. Mild interlobular septal thickening. Dependent consolidations of the right upper and lower lobe. Additional bilateral nodular opacities which are most pronounced in the bilateral lower lungs and right upper lobe. Small left and trace right basilar pneumothoraces. Upper Abdomen: Cholelithiasis.  No acute abnormality. Musculoskeletal: Bilateral anterior rib fractures spanning the 2nd-7th ribs. Soft tissue thickening is seen adjacent to some of the rib fractures, likely due to chest wall hematoma. Review of the MIP images confirms the above findings. IMPRESSION: No evidence of pulmonary embolus. Small left and trace right pneumothoraces. Mild stranding of the anterior mediastinal fat, likely due to mediastinal hematoma. Endobronchial debris, dependent consolidations of the right lung and bilateral nodular opacities, findings are likely due to aspiration, infection is an additional consideration. Areas of mild interlobular septal thickening suggest a  component of pulmonary edema. Bilateral anterior rib fractures spanning the 2nd-7th ribs. Soft tissue thickening is seen adjacent to some of the rib fractures, likely due to chest wall hematomas. These results will be called to the ordering clinician by the Radiologist, and communication documented in the PACS. Electronically Signed: By: Allegra Lai M.D. On: 04/24/21 14:22   DG Chest Port 1 View  Result Date: 24-Apr-2021 CLINICAL DATA:  Intubation after cardiopulmonary resuscitation. EXAM: PORTABLE CHEST 1 VIEW COMPARISON:  Chest radiograph dated 2021/04/24.  FINDINGS: The heart is enlarged. Vascular calcifications are seen in the aortic arch. Atelectasis and bilateral interstitial opacities are seen throughout both lungs, most significant in the right upper lung. There is no pneumothorax. An endotracheal tube terminates in the upper thoracic trachea. An enteric tube terminates in the stomach. Defibrillator pads are noted. Median sternotomy wires are redemonstrated. A right internal jugular catheter tip overlies the superior aspect of the superior vena cava. Previously seen rib fractures are less well demonstrated on this exam. IMPRESSION: Atelectasis and bilateral interstitial opacities, most significant in the right upper lung. Aortic Atherosclerosis (ICD10-I70.0). Electronically Signed   By: Romona Curls M.D.   On: 2021/04/24 15:20   DG Chest Portable 1 View  Result Date: 24-Apr-2021 CLINICAL DATA:  History of cardiac arrest EXAM: PORTABLE CHEST 1 VIEW COMPARISON:  Chest x-ray dated January 18, 2021 FINDINGS: ET tube tip is proximally 8.0 cm from the carina. Cardiac and mediastinal contours are unchanged post median sternotomy. Heterogeneous opacities of the bilateral upper lungs. No large pleural effusion or evidence of pneumothorax. IMPRESSION: ET tube tip is proximally 8.0 cm from the carina. Right-greater-than-left heterogeneous opacities which are most pronounced in the upper lungs, differential possibility infection or aspiration. Electronically Signed   By: Allegra Lai M.D.   On: 04-24-21 13:58    Cardiac Studies   Echo pending  Impression   Principal Problem:   Cardiac arrest [facility]) Active Problems:   Hyperlipidemia   Essential hypertension   Permanent atrial fibrillation (HCC)   Chronic anticoagulation   Cardiomyopathy, ischemic   Recommendation   [name] suffered cardiac arrest with possible primary cardiac etiology as there was complaint of chest pain prior.  His bypass grafts are quite old and could have lost the graft as an  explanation for his symptoms however it is not as common to have cardiac arrest in the setting.  He also could have developed worsening cardiomyopathy and perhaps suffered his ventricular related to that.  He does have a history of a cardiomyopathy with LVEF 45 to 50% however this was not recently reassessed.  He did have COVID-19 over the summer but received paxlovid and according to his family did pretty well with that.  With initial elevations in troponin, would recommend heparinization.  We will get a 2D echo, but no acute indication for cardiac catheterization at this time. Continue amiodarone given history of recent VT. I am concerned about his decorticate posturing and the implication of either ongoing seizure activity or perhaps hypoxic encephalopathy.  EEG is planned per critical care.  The case was discussed with Dr. Denese Killings and the family at the bedside.  [name] does have some advanced directives, but he is daughter-in-law who is power of attorney could not state whether he wishes to be DNR at this time.  Thanks for the consultation, cardiology will follow with you.  CRITICAL CARE TIME: I have spent a total of 45 minutes with patient reviewing hospital notes, telemetry, EKGs, labs and examining  the patient as well as establishing an assessment and plan that was discussed with the patient.  > 50% of time was spent in direct patient care. The patient is critically ill with multi-organ system failure and requires high complexity decision making for assessment and support, frequent evaluation and titration of therapies, application of advanced monitoring technologies and extensive interpretation of multiple databases.   Length of Stay:  LOS: 0 days   Chrystie Nose, MD, [facility], FACP  [place]  [facility]  Medical Director of the Advanced Lipid Disorders &  Cardiovascular Risk Reduction Clinic Diplomate of the American Board of Clinical Lipidology Attending Cardiologist  Direct  Dial: [phone]  Fax: [phone]  Website:  [email or web address] Nicely Lang [email or web address] 2021-04-26, 4:15 PM

## 2021-04-30 NOTE — Procedures (Signed)
Extubation Procedure Note  Patient Details:   Name: [name] DOB: 1931/05/25 MRN: [id number]   Airway Documentation:    Vent end date: Apr 15, 2021 Vent end time: 2316   Evaluation  O2 sats: currently acceptable Complications: No apparent complications Patient did not tolerate procedure well. Bilateral Breath Sounds: Clear, Diminished   No  Roanna Raider 2021-04-15, 11:17 PM

## 2021-04-30 NOTE — Progress Notes (Signed)
Critical care attending attestation note:  Patient seen and examined and relevant ancillary tests reviewed.  I agree with the assessment and plan of care as outlined by Janeann Forehand, NP.   Synopsis of assessment and plan:  87 year old man who is critically ill due to witnessed VF cardiac arrest, now in undifferentiated shock likely distributive following cardiac arrest and cardiogenic from known coronary disease.  Also requiring mechanical ventilation for acute hypoxic respiratory failure.  On examination he appears his stated age.  He is orally intubated.  He is flexor posturing in both upper extremities.  There is no other response to pain.  He is tachypneic with high airway pressures.  Breath sounds are distant.  Heart sounds are likewise distant extremities are cool but there is no peripheral edema.  Abdomen is soft and nontender.  Unable to obtain point-of-care echocardiogram.  ABG shows mild metabolic acidosis.  Electrolytes are unremarkable.  Creatinine is normal.  Mild LFT elevation.  Troponin is elevated at 832.  Marked lactic acidosis 6.3.  Marked leukocytosis at 31.5.  D-dimer is elevated at greater than 20.  CT PE protocol however shows no evidence of pulmonary embolism.  There is evidence of right-sided aspiration.  Initial CT head is unremarkable.  Assessment:  -Status post cardiac arrest.  Prognosis is poor given slow neurological recovery and age.  He is also in shock which further complicates matters.  I have had a frank discussion with the family regarding realistic expectations for recovery. -First priority is a hemodynamic stabilization we will attempt to get a formal echocardiogram, obtain an SCV O2 to help optimize vasopressor medications.  CT scan shows no evidence of clear infections apart from the aspiration which we will cover with ceftriaxone. -If we are able to obtain hemodynamic stabilization we will proceed with neuro prognostication at the 96-hour mark.  In the  interim we will provide sedation with fentanyl and Versed due to hypotension   CRITICAL CARE Performed by: Lynnell Catalan   Total critical care time: 40 minutes  Critical care time was exclusive of separately billable procedures and treating other patients.  Critical care was necessary to treat or prevent imminent or life-threatening deterioration.  Critical care was time spent personally by me on the following activities: development of treatment plan with patient and/or surrogate as well as nursing, discussions with consultants, evaluation of patient's response to treatment, examination of patient, obtaining history from patient or surrogate, ordering and performing treatments and interventions, ordering and review of laboratory studies, ordering and review of radiographic studies, pulse oximetry, re-evaluation of patient's condition and participation in multidisciplinary rounds.  Lynnell Catalan, MD [facility] ICU Physician [facility]  Pager: [phone] Mobile: [phone] After hours: [phone].  04-23-21, 3:51 PM

## 2021-04-30 NOTE — Progress Notes (Signed)
EEG complete - results pending 

## 2021-04-30 NOTE — ED Notes (Signed)
Family updated as to patient's status.

## 2021-04-30 NOTE — Progress Notes (Signed)
Cardiac Time of Death pronounced by two RNs at 10/25/40.  Royston Bake, RN Peggye Ley, RN

## 2021-04-30 NOTE — Code Documentation (Addendum)
Pt arrived with CPR in progress. Pt was at [facility]. States that he had CP and collapsed. They shocked once. EMS arrived and pt was pulseless again. Resumed  CPR and states he had an episode of vfi. Gave 300 of amiodaron. Pt went pulseless again and CPR resumed.  Received 6-7 rounds of Epi.

## 2021-04-30 NOTE — Progress Notes (Signed)
I responded to a page from the nurse to provide spiritual support for the patient's family. I arrived at the patient's room with family members present. I shared words of encouragement and comfort, read from the scriptures, and led in prayer. I provided spiritual care through pastoral presence and remained with the family until the patient died. I shared that the Chaplain is available for additional support as needed or requested.    Apr 17, 2021 2357  Clinical Encounter Type  Visited With Patient and family together  Visit Type Spiritual support;Death  Referral From Nurse  Consult/Referral To Chaplain  Spiritual Encounters  Spiritual Needs Prayer;Emotional;Grief support    Chaplain Dr Melvyn Novas

## 2021-04-30 NOTE — Progress Notes (Signed)
Red Seal of Foley Catheter removed by Urology and flushed.

## 2021-04-30 NOTE — ED Notes (Signed)
Epi at 15 mcg per/min

## 2021-04-30 NOTE — Progress Notes (Signed)
Patient came in intubated with a 7.0 ETT taped at 25 cm at the lip good BBS ausculted over lung fields, good color change on ETCO2 detector, SATS 93%, placed on above vent settings, will continue to monitor patient.

## 2021-04-30 NOTE — H&P (Signed)
NAME:  [name], MRN:  [id number], DOB:  Nov 13, 1930, LOS: 0 ADMISSION DATE:  04-17-2021, CONSULTATION DATE:  Apr 17, 2021 REFERRING MD:  Dr. Lynelle Doctor, CHIEF COMPLAINT:  Cardiac arrest    History of Present Illness:  [name] is a 87 y.o. male with a PMH significant for prior MI with CAD s/p CABG, A-fib, HLD, HTN, and obesity who presented to the ED after suffering OOH cardiac arrest. On EMS arrival patient was seen in VT/VT arrest and patient received ACLS protocol with compression, shock, and amio pushes with active CPR in progress on ED presentation. Patient intubated on ED arrival with eventual ROSC achieved in ED with Epi drip in place.   Labwork on admit significant for glucose 193, elevated LFT, HS troponin 832, lactic acid 6.3, WBC 31.5, d-dimer >20. CTA chest was negative for PE but revealed trace pleural effusions, signs of aspiration PNA with endobronchial debris, bilateral rib fractures 2-7 with likely chest wall edema. PCCM consulted for further management and admission.   Pertinent  Medical History  Prior MI with CAD s/p CABG A-fib HLD HTN, Obesity  Significant Hospital Events:  10/1 OOS cardiac arrest   Interim History / Subjective:  As above   Objective   Blood pressure (!) 67/50, pulse (!) 51, resp. rate (!) 24, height 5\' 9"  (1.753 m), SpO2 94 %.    Vent Mode: PRVC FiO2 (%):  [100 %] 100 % Set Rate:  [20 bmp] 20 bmp Vt Set:  mL] 680 mL PEEP:  [5 cmH20] 5 cmH20 Plateau Pressure:  [13 cmH20] 13 cmH20  No intake or output data in the 24 hours ending 2021-04-17 1447 There were no vitals filed for this visit.  Examination: General: Acute on chronic ill appearing elderly male on mechanical ventilation, in NAD HEENT: ETT, MM pink/moist, PERRL,  Neuro: Sedated on vent with posturing signs  CV: s1s2 regular rate and rhythm, no murmur, rubs, or gallops,  PULM:  Rhonchi bilaterally, tolerating vent GI: soft, bowel sounds active in all 4 quadrants, non-tender,  non-distended Extremities: warm/dry, no edema  Skin: no rashes or lesions  Resolved Hospital Problem list     Assessment & Plan:  OOH Cardiac arrest -Seen in VT/VF with EMS  -45 minutes downtime prior to ROSC with signs of an anxiouc injury on arrival and multi-system injury  Prior MI with CAD s/p CABG Hx of A-fib P: Normothermia protocol  CVL, in place  Place arterial line on ICU arrival  Continue pressor support for MAP goal > 65  Assess CVP's, echo, UDS Trend troponin and lactate. Cardiology following, appreciate assistance  Continuous telemetry   Undifferentiated shock Possible underlying sepsis vs cardiogenic   -Given cardiac arrest on arrival vital signs difficult to assess for criteria but lactic is elevated at 6.3 with WBC 31.5 -Signs of aspiration on CTA chest  P: Admit ICU Vent support as above  Pan cultures prior to antibiotic IV ceftriaxone  Pressors as above for MAP goal > 65 Trend lactic acid Monitor urine output Obtain ECHO  Obtain COOX  Acute hypoxic respiratory failure secondary to above  -Intubated on ED arrival  -CTA negative for PE  P: Continue ventilator support with lung protective strategies  Wean PEEP and FiO2 for sats greater than 90%. Head of bed elevated 30 degrees. Plateau pressures less than 30 cm H20.  Follow intermittent chest x-ray and ABG.   Hold SBT until off NMB. Ensure adequate pulmonary hygiene  Follow cultures  VAP bundle in place  PAD protocol  At risk for anoxic encephalopathy. -Appears to be decorticate posturing on bedside assessment  P: PAD protocol with RASS goal -2 Management per neurology  Maintain neuro protective measures; goal for eurothermia, euglycemia, eunatermia, normoxia, and PCO2 goal of 35-40 Nutrition and bowel regiment  Seizure precautions  EEG stat  Aspirations precautions   Elevated LFTs -Possible shock liver in the setting of cardiac arrest  P: Avoid hepatotoxins  Supportive care as above    Best Practice    Diet/type: NPO DVT prophylaxis: prophylactic heparin  GI prophylaxis: PPI Lines: Central line Foley:  Yes, and it is still needed Code Status:  full code Last date of multidisciplinary goals of care discussion: GOC discussed with family who states that patient has a living will and they would like to review this prior to any change in dose status. As of now patient will remain a FULL code. Family understand that patient is critically ill and has a very guarded prognosis.  Labs   CBC: Recent Labs  Lab 04/25/21 1317 2021/04/25 1323 04-25-2021 1435  WBC 31.5*  --   --   HGB 13.9 13.3 11.9*  HCT 43.7 39.0 35.0*  MCV 105.6*  --   --   PLT 195  --   --     Basic Metabolic Panel: Recent Labs  Lab 04/25/2021 1317 04/25/2021 1323 04-25-21 1435  NA 138 140 140  K 3.8 3.8 3.4*  CL 108 108  --   CO2 16*  --   --   GLUCOSE 193* 186*  --   BUN 19 25*  --   CREATININE 1.20 1.00  --   CALCIUM 8.0*  --   --   MG 2.1  --   --    GFR: CrCl cannot be calculated (Unknown ideal weight.). Recent Labs  Lab 04/25/2021 1311 Apr 25, 2021 1317  WBC  --  31.5*  LATICACIDVEN 6.3*  --     Liver Function Tests: Recent Labs  Lab Apr 25, 2021 1317  AST 223*  ALT 242*  ALKPHOS 116  BILITOT 0.9  PROT 5.7*  ALBUMIN 2.9*   No results for input(s): LIPASE, AMYLASE in the last 168 hours. No results for input(s): AMMONIA in the last 168 hours.  ABG    Component Value Date/Time   PHART 7.284 (L) April 25, 2021 1435   PCO2ART 33.1 Apr 25, 2021 1435   PO2ART 108 04/25/2021 1435   HCO3 15.7 (L) 04-25-21 1435   TCO2 17 (L) 25-Apr-2021 1435   ACIDBASEDEF 10.0 (H) 2021/04/25 1435   O2SAT 98.0 04/25/2021 1435     Coagulation Profile: No results for input(s): INR, PROTIME in the last 168 hours.  Cardiac Enzymes: No results for input(s): CKTOTAL, CKMB, CKMBINDEX, TROPONINI in the last 168 hours.  HbA1C: Hgb A1c MFr Bld  Date/Time Value Ref Range Status  09/28/2017 03:29 PM 5.8 (H) 4.8  - 5.6 % Final    Comment:             Prediabetes: 5.7 - 6.4          Diabetes: >6.4          Glycemic control for adults with diabetes: <7.0   06/03/2016 10:05 AM 5.9 (H) 4.8 - 5.6 % Final    Comment:             Pre-diabetes: 5.7 - 6.4          Diabetes: >6.4          Glycemic control for adults with diabetes: <  7.0     CBG: No results for input(s): GLUCAP in the last 168 hours.  Review of Systems:   Unable to assess   Past Medical History:  He,  has a past medical history of Anticoagulant disorder (HCC), ASCVD (arteriosclerotic cardiovascular disease), Atrial fibrillation (HCC), CHF (congestive heart failure) (HCC), Coronary artery disease, History of kidney stones, HOH (hard of hearing), Hyperlipidemia, Hypertension, Myocardial infarction (HCC), Nephrolithiasis, and Overweight(278.02).   Surgical History:   Past Surgical History:  Procedure Laterality Date   ANGIOPLASTY  1991   CATARACT EXTRACTION W/PHACO Right 02/22/2018   Procedure: CATARACT EXTRACTION PHACO AND INTRAOCULAR LENS PLACEMENT RIGHT EYE CDE=16.62;  Surgeon: Gemma Payor, MD;  Location: [facility];  Service: Ophthalmology;  Laterality: Right;  right   CATARACT EXTRACTION W/PHACO Left 03/29/2018   Procedure: CATARACT EXTRACTION PHACO AND INTRAOCULAR LENS PLACEMENT (IOC);  Surgeon: Gemma Payor, MD;  Location: [facility];  Service: Ophthalmology;  Laterality: Left;  CDE: 16.37   COLONOSCOPY  2008   CORONARY ARTERY BYPASS GRAFT  04/2005     Social History:   reports that he has never smoked. He has never used smokeless tobacco. He reports that he does not drink alcohol and does not use drugs.   Family History:  His family history includes Diabetes in his brother; Heart attack in his father; Hypertension in his father.   Allergies Allergies  Allergen Reactions   Ibuprofen Rash and Other (See Comments)    Skin rash     Home Medications  Prior to Admission medications   Medication Sig Start Date End Date Taking?  Authorizing Provider  amLODipine-benazepril (LOTREL) 10-40 MG capsule TAKE 1 CAPSULE BY MOUTH ONCE DAILY. 04/18/20   Antoine Poche, MD  atorvastatin (LIPITOR) 80 MG tablet TAKE 1 TABLET ONCE DAILY. 12/17/20   Antoine Poche, MD  furosemide (LASIX) 20 MG tablet TAKE 1 TABLET ONCE DAILY. 12/17/20   Antoine Poche, MD  metoprolol succinate (TOPROL-XL) 50 MG 24 hr tablet TAKE 1 TABLET ONCE A DAY IMMEDIATELY FOLLOWING A MEAL. 05/21/20   Runell Gess, MD  warfarin (COUMADIN) 5 MG tablet Take 1/2 tablet daily  except 1 tablet on Mondays and Thursdays or as directed 03/19/21   Antoine Poche, MD     Critical care time:    Performed by: Omar Orrego D. Harris  Total critical care time: 55 minutes  Critical care time was exclusive of separately billable procedures and treating other patients.  Critical care was necessary to treat or prevent imminent or life-threatening deterioration.  Critical care was time spent personally by me on the following activities: development of treatment plan with patient and/or surrogate as well as nursing, discussions with consultants, evaluation of patient's response to treatment, examination of patient, obtaining history from patient or surrogate, ordering and performing treatments and interventions, ordering and review of laboratory studies, ordering and review of radiographic studies, pulse oximetry and re-evaluation of patient's condition.  Aleric Froelich D. Tiburcio Pea, NP-C [facility] Personal contact information can be found on Amion  04/22/21, 3:58 PM

## 2021-04-30 NOTE — Code Documentation (Signed)
Pulse present

## 2021-04-30 NOTE — ED Triage Notes (Signed)
Pt arrived with CPR in progress. Pt was at [facility]. States that he had CP and collapsed, falling out of chair.They shocked once. EMS arrived and pt was pulseless again. Resumed  CPR and states he had an episode of vfi. Gave 300 of amiodaron. Pt went pulseless again and CPR resumed.  Received 6-7 rounds of Epi.

## 2021-04-30 NOTE — ED Provider Notes (Signed)
[facility] EMERGENCY DEPARTMENT Provider Note   CSN: [id number] Arrival date & time: April 25, 2021  1309    Level 5 caveat: Cardiac arrest History Chief complaint: Cardiac arrest  [name] is a 87 y.o. male.  HPI  Patient presented to the ED for evaluation after cardiac arrest.  Patient was at the [facility].  He suddenly started having chest pain and shortly thereafter collapsed and fell out of a chair.  EMS was contacted.  Patient did have 1 shockable rhythm.  During his resuscitation he had intermittent episodes of PEA as well as V. fib.  He was treated with amiodarone, CPR, intubation, 6-7 rounds of epinephrine with intermittent episodes of spontaneous circulation.  Past Medical History:  Diagnosis Date   Anticoagulant disorder ([place])    ASCVD (arteriosclerotic cardiovascular disease)    CABG 11/06; MUGA 11/08 - 44%; 3/07 mildly decreased EF on echo   Atrial fibrillation (HCC)    Initially occurred following CABG; event recorder in 11/08 - no AF; recurred 08/2009   CHF (congestive heart failure) ([place])    Coronary artery disease    History of kidney stones    HOH (hard of hearing)    Hyperlipidemia    Hypertension    Myocardial infarction [facility])    Nephrolithiasis    Overweight(278.02)     Patient Active Problem List   Diagnosis Date Noted   Cardiac arrest ([place]) 04/25/2021   Morbid obesity ([place]) 05/08/2020   Cardiomyopathy, ischemic 05/01/2016   Viral gastroenteritis 01/06/2015   Prediabetes 08/25/2014   Encounter for therapeutic drug monitoring 08/01/2013   Permanent atrial fibrillation ([place]) 10/01/2010   Chronic anticoagulation 10/01/2010   Hyperlipidemia 12/18/2009   Overweight(278.02) 12/18/2009   NEPHROLITHIASIS 12/18/2009   Essential hypertension 08/07/2009    Past Surgical History:  Procedure Laterality Date   ANGIOPLASTY  1991   CATARACT EXTRACTION W/PHACO Right 02/22/2018   Procedure: CATARACT EXTRACTION PHACO AND INTRAOCULAR LENS  PLACEMENT RIGHT EYE CDE=16.62;  Surgeon: Tonny Branch, MD;  Location: [facility] ORS;  Service: Ophthalmology;  Laterality: Right;  right   CATARACT EXTRACTION W/PHACO Left 03/29/2018   Procedure: CATARACT EXTRACTION PHACO AND INTRAOCULAR LENS PLACEMENT (IOC);  Surgeon: Tonny Branch, MD;  Location: [facility];  Service: Ophthalmology;  Laterality: Left;  CDE: 16.37   COLONOSCOPY  2008   CORONARY ARTERY BYPASS GRAFT  04/2005       Family History  Problem Relation Age of Onset   Hypertension Father    Heart attack Father    Diabetes Brother     Social History   Tobacco Use   Smoking status: Never   Smokeless tobacco: Never  Vaping Use   Vaping Use: Never used  Substance Use Topics   Alcohol use: No   Drug use: No    Home Medications Prior to Admission medications   Medication Sig Start Date End Date Taking? Authorizing Provider  amLODipine-benazepril (LOTREL) 10-40 MG capsule TAKE 1 CAPSULE BY MOUTH ONCE DAILY. Patient taking differently: Take 1 capsule by mouth daily. 04/18/20   Arnoldo Lenis, MD  atorvastatin (LIPITOR) 80 MG tablet TAKE 1 TABLET ONCE DAILY. Patient taking differently: Take 80 mg by mouth daily. 12/17/20   Arnoldo Lenis, MD  furosemide (LASIX) 20 MG tablet TAKE 1 TABLET ONCE DAILY. Patient taking differently: Take 20 mg by mouth daily. 12/17/20   Arnoldo Lenis, MD  metoprolol succinate (TOPROL-XL) 50 MG 24 hr tablet TAKE 1 TABLET ONCE A DAY IMMEDIATELY FOLLOWING A MEAL. Patient taking  differently: Take 50 mg by mouth daily. Take immediately following a meal 05/21/20   Lorretta Harp, MD  warfarin (COUMADIN) 5 MG tablet Take 1/2 tablet daily  except 1 tablet on Mondays and Thursdays or as directed Patient taking differently: Take 5 mg by mouth See admin instructions. Take 1 tablet (5 mg) by mouth on Monday and Thursday, take 1/2 tablet (2.5 mg) on Sunday, Tuesday, Wednesday, Friday, Saturday or as directed by anti-coag clinic. 03/19/21   Arnoldo Lenis, MD     Allergies    Ibuprofen  Review of Systems   Review of Systems  Unable to perform ROS: Mental status change  All other systems reviewed and are negative.  Physical Exam Updated Vital Signs BP (!) 105/49 (BP Location: Right Leg)   Pulse 64   Temp (!) 96.5 F (35.8 C) (Axillary)   Resp (!) 23   Ht 1.753 m (_0 )   Wt 104.7 kg   SpO2 100%   BMI 34.09 kg/m   Physical Exam Vitals and nursing note reviewed.  Constitutional:      General: He is in acute distress.     Appearance: He is well-developed. He is ill-appearing.  HENT:     Head: Normocephalic and atraumatic.     Right Ear: External ear normal.     Left Ear: External ear normal.  Eyes:     General: No scleral icterus.       Right eye: No discharge.        Left eye: No discharge.     Conjunctiva/sclera: Conjunctivae normal.  Neck:     Trachea: No tracheal deviation.  Cardiovascular:     Rate and Rhythm: Bradycardia present.     Comments: Weak femoral pulses  Pulmonary:     Effort: No respiratory distress.     Breath sounds: No stridor. No wheezing or rales.     Comments: Accessory muscle use Abdominal:     General: Bowel sounds are normal. There is no distension.     Palpations: Abdomen is soft.     Tenderness: There is no abdominal tenderness. There is no guarding or rebound.  Musculoskeletal:        General: No tenderness or deformity.     Cervical back: Neck supple.  Skin:    General: Skin is warm and dry.     Findings: No rash.  Neurological:     General: No focal deficit present.     Cranial Nerves: No cranial nerve deficit (no facial droop, extraocular movements intact, no slurred speech).     Sensory: No sensory deficit.     Motor: No abnormal muscle tone or seizure activity.     Coordination: Coordination normal.  Psychiatric:        Mood and Affect: Mood normal.    ED Results / Procedures / Treatments   Labs (all labs ordered are listed, but only abnormal results are displayed) Labs  Reviewed  CBC - Abnormal; Notable for the following components:      Result Value   WBC 31.5 (*)    RBC 4.14 (*)    MCV 105.6 (*)    All other components within normal limits  COMPREHENSIVE METABOLIC PANEL - Abnormal; Notable for the following components:   CO2 16 (*)    Glucose, Bld 193 (*)    Calcium 8.0 (*)    Total Protein 5.7 (*)    Albumin 2.9 (*)    AST 223 (*)    ALT 242 (*)  GFR, Estimated 58 (*)    All other components within normal limits  D-DIMER, QUANTITATIVE - Abnormal; Notable for the following components:   D-Dimer, Quant >20.00 (*)    All other components within normal limits  LACTIC ACID, PLASMA - Abnormal; Notable for the following components:   Lactic Acid, Venous 6.3 (*)    All other components within normal limits  LACTIC ACID, PLASMA - Abnormal; Notable for the following components:   Lactic Acid, Venous 8.1 (*)    All other components within normal limits  PROTIME-INR - Abnormal; Notable for the following components:   Prothrombin Time 42.7 (*)    INR 4.5 (*)    All other components within normal limits  CBC - Abnormal; Notable for the following components:   WBC 28.1 (*)    RBC 3.70 (*)    Hemoglobin 12.5 (*)    HCT 37.9 (*)    MCV 102.4 (*)    All other components within normal limits  CREATININE, SERUM - Abnormal; Notable for the following components:   Creatinine, Ser 1.52 (*)    GFR, Estimated 44 (*)    All other components within normal limits  I-STAT CHEM 8, ED - Abnormal; Notable for the following components:   BUN 25 (*)    Glucose, Bld 186 (*)    Calcium, Ion 1.04 (*)    TCO2 21 (*)    All other components within normal limits  I-STAT ARTERIAL BLOOD GAS, ED - Abnormal; Notable for the following components:   pH, Arterial 7.284 (*)    Bicarbonate 15.7 (*)    TCO2 17 (*)    Acid-base deficit 10.0 (*)    Potassium 3.4 (*)    HCT 35.0 (*)    Hemoglobin 11.9 (*)    All other components within normal limits  TROPONIN I (HIGH  SENSITIVITY) - Abnormal; Notable for the following components:   Troponin I (High Sensitivity) 832 (*)    All other components within normal limits  TROPONIN I (HIGH SENSITIVITY) - Abnormal; Notable for the following components:   Troponin I (High Sensitivity) 2,996 (*)    All other components within normal limits  RESP PANEL BY RT-PCR (FLU A&B, COVID) ARPGX2  MRSA NEXT GEN BY PCR, NASAL  CULTURE, BLOOD (ROUTINE X 2)  CULTURE, BLOOD (ROUTINE X 2)  URINE CULTURE  CULTURE, RESPIRATORY W GRAM STAIN  MAGNESIUM  COOXEMETRY PANEL  PROCALCITONIN  URINALYSIS, ROUTINE W REFLEX MICROSCOPIC  CBC  BASIC METABOLIC PANEL  MAGNESIUM  PHOSPHORUS  PROCALCITONIN  PROTIME-INR  CBG MONITORING, ED    EKG EKG Interpretation  Date/Time:  2021-04-17 13:09:49 EDT Ventricular Rate:  86 PR Interval:  150 QRS Duration: 125 QT Interval:  399 QTC Calculation: 489 R Axis:   -65 Text Interpretation: Sinus rhythm Ventricular premature complex IVCD, consider atypical RBBB Inferior infarct, old Anterior infarct, old Confirmed by Dorie Rank [phone]) on 04-17-2021 1:11:54 PM  Radiology CT HEAD WO CONTRAST (5MM)  Result Date: April 17, 2021 CLINICAL DATA:  Cardiac arrest. EXAM: CT HEAD WITHOUT CONTRAST TECHNIQUE: Contiguous axial images were obtained from the base of the skull through the vertex without intravenous contrast. COMPARISON:  MRI brain dated June 25, 2011. FINDINGS: Brain: No evidence of acute infarction, hemorrhage, hydrocephalus, extra-axial collection or mass lesion/mass effect. Mild-to-moderate generalized cerebral atrophy. Scattered moderate periventricular and subcortical white matter hypodensities are nonspecific, but favored to reflect chronic microvascular ischemic changes. Vascular: Calcified atherosclerosis at the skullbase. No hyperdense vessel. Skull: Normal. Negative for  fracture or focal lesion. Sinuses/Orbits: Small air-fluid level in the right maxillary sinus. Secretions in  the posterior nasopharynx. Endotracheal and enteric tubes partially visualized. Other: None. IMPRESSION: 1. No acute intracranial abnormality. Electronically Signed   By: Titus Dubin M.D.   On: Apr 29, 2021 14:27   CT Angio Chest PE W and/or Wo Contrast  Addendum Date: 2021-04-29   ADDENDUM REPORT: 29-Apr-2021 14:33 ADDENDUM: Critical Value/emergent results were called by telephone on 04/29/2021 at 2:33 pm to provider Rei Contee , who verbally acknowledged these results. Electronically Signed   By: Yetta Glassman M.D.   On: 2021-04-29 14:33   Result Date: Apr 29, 2021 CLINICAL DATA:  Concern for pulmonary embolus EXAM: CT ANGIOGRAPHY CHEST WITH CONTRAST TECHNIQUE: Multidetector CT imaging of the chest was performed using the standard protocol during bolus administration of intravenous contrast. Multiplanar CT image reconstructions and MIPs were obtained to evaluate the vascular anatomy. CONTRAST:  125m OMNIPAQUE IOHEXOL 350 MG/ML SOLN COMPARISON:  None. FINDINGS: Cardiovascular: Adequate contrast opacification of the pulmonary arteries. No evidence of pulmonary embolus. Three-vessel coronary artery calcifications. Prior CABG. Mild cardiomegaly with no evidence of pericardial effusion. Mild stranding of the anterior mediastinal fat with no fluid collections. Mediastinum/Nodes: Enteric tube courses through the esophagus into the stomach. No pathologically enlarged lymph nodes seen in the chest. Lungs/Pleura: Bilateral bronchial wall thickening with scattered areas of endobronchial debris., most pronounced in the bilateral lower lungs. Mild interlobular septal thickening. Dependent consolidations of the right upper and lower lobe. Additional bilateral nodular opacities which are most pronounced in the bilateral lower lungs and right upper lobe. Small left and trace right basilar pneumothoraces. Upper Abdomen: Cholelithiasis.  No acute abnormality. Musculoskeletal: Bilateral anterior rib fractures spanning the  2nd-7th ribs. Soft tissue thickening is seen adjacent to some of the rib fractures, likely due to chest wall hematoma. Review of the MIP images confirms the above findings. IMPRESSION: No evidence of pulmonary embolus. Small left and trace right pneumothoraces. Mild stranding of the anterior mediastinal fat, likely due to mediastinal hematoma. Endobronchial debris, dependent consolidations of the right lung and bilateral nodular opacities, findings are likely due to aspiration, infection is an additional consideration. Areas of mild interlobular septal thickening suggest a component of pulmonary edema. Bilateral anterior rib fractures spanning the 2nd-7th ribs. Soft tissue thickening is seen adjacent to some of the rib fractures, likely due to chest wall hematomas. These results will be called to the ordering clinician by the Radiologist, and communication documented in the PACS. Electronically Signed: By: LYetta GlassmanM.D. On: 110/31/2214:22   [place]  Result Date: 1October 31, 2022CLINICAL DATA:  Scrotal edema. EXAM: SCROTAL ULTRASOUND DOPPLER ULTRASOUND OF THE TESTICLES TECHNIQUE: Complete ultrasound examination of the testicles, epididymis, and other scrotal structures was performed. Color and spectral Doppler ultrasound were also utilized to evaluate blood flow to the testicles. COMPARISON:  None. FINDINGS: Right testicle Measurements: 5.7 cm x 1.9 cm x 1.8 cm. No mass or microlithiasis visualized. Left testicle The left testicle is not visualized. Right epididymis:  Normal in size and appearance. Left epididymis:  The left epididymis is not visualized. Hydrocele:  None visualized. Varicocele:  None visualized. Other: A 13.3 cm x 9.0 cm x 9.8 cm spermatocele is noted on the LEFT. Pulsed Doppler interrogation of the RIGHT testicle demonstrates normal low resistance arterial and venous waveforms bilaterally. IMPRESSION: Large left-sided spermatocele, with subsequently limited visualization of the left  testicle and left epididymis. Electronically Signed   By: TVirgina NorfolkM.D.   On: 1October 31, 202219:01  DG Chest Port 1 View  Result Date: 03/31/2021 CLINICAL DATA:  Intubation after cardiopulmonary resuscitation. EXAM: PORTABLE CHEST 1 VIEW COMPARISON:  Chest radiograph dated 03/31/2021. FINDINGS: The heart is enlarged. Vascular calcifications are seen in the aortic arch. Atelectasis and bilateral interstitial opacities are seen throughout both lungs, most significant in the right upper lung. There is no pneumothorax. An endotracheal tube terminates in the upper thoracic trachea. An enteric tube terminates in the stomach. Defibrillator pads are noted. Median sternotomy wires are redemonstrated. A right internal jugular catheter tip overlies the superior aspect of the superior vena cava. Previously seen rib fractures are less well demonstrated on this exam. IMPRESSION: Atelectasis and bilateral interstitial opacities, most significant in the right upper lung. Aortic Atherosclerosis (ICD10-I70.0). Electronically Signed   By: Zerita Boers M.D.   On: 31-Mar-2021 15:20   DG Chest Portable 1 View  Result Date: 03/31/2021 CLINICAL DATA:  History of cardiac arrest EXAM: PORTABLE CHEST 1 VIEW COMPARISON:  Chest x-ray dated January 18, 2021 FINDINGS: ET tube tip is proximally 8.0 cm from the carina. Cardiac and mediastinal contours are unchanged post median sternotomy. Heterogeneous opacities of the bilateral upper lungs. No large pleural effusion or evidence of pneumothorax. IMPRESSION: ET tube tip is proximally 8.0 cm from the carina. Right-greater-than-left heterogeneous opacities which are most pronounced in the upper lungs, differential possibility infection or aspiration. Electronically Signed   By: Yetta Glassman M.D.   On: 03-31-2021 13:58    Procedures .Critical Care Performed by: Dorie Rank, MD Authorized by: Dorie Rank, MD   Critical care provider statement:    Critical care time (minutes):  45    Critical care was time spent personally by me on the following activities:  Discussions with consultants, evaluation of patient's response to treatment, examination of patient, ordering and performing treatments and interventions, ordering and review of laboratory studies, ordering and review of radiographic studies, pulse oximetry, re-evaluation of patient's condition, obtaining history from patient or surrogate and review of old charts .Central Line  Date/Time: 2021/03/31 4:37 PM Performed by: Dorie Rank, MD Authorized by: Dorie Rank, MD   Consent:    Consent obtained:  Emergent situation Universal protocol:    Patient identity confirmed:  Anonymous protocol, patient vented/unresponsive Pre-procedure details:    Indication(s): central venous access     Hand hygiene: Hand hygiene performed prior to insertion     Sterile barrier technique: All elements of maximal sterile technique followed     Skin preparation:  Chlorhexidine Sedation:    Sedation type:  None Anesthesia:    Anesthesia method:  None Procedure details:    Location:  R internal jugular   Procedural supplies:  Triple lumen   Ultrasound guidance: yes     Ultrasound guidance timing: real time     Sterile ultrasound techniques: Sterile gel and sterile probe covers were used     Number of attempts:  1   Successful placement: yes   Post-procedure details:    Post-procedure:  Dressing applied   Assessment:  Blood return through all ports   Procedure completion:  Tolerated well, no immediate complications   Medications Ordered in ED Medications  sodium chloride 0.9 % bolus 1,000 mL (1,000 mLs Intravenous New Bag/Given 03/31/21 1444)    And  0.9 %  sodium chloride infusion ( Intravenous Infusion Verify Mar 31, 2021 2000)  EPINEPHrine (ADRENALIN) 5 mg in NS 250 mL (0.02 mg/mL) premix infusion (21 mcg/min Intravenous Infusion Verify 2021-03-31 2000)  amiodarone (NEXTERONE PREMIX) 360-4.14 MG/200ML-% (1.8 mg/mL)  IV infusion (30 mg/hr  Intravenous Rate/Dose Change 2021/04/29 1829)  amiodarone (NEXTERONE PREMIX) 360-4.14 MG/200ML-% (1.8 mg/mL) IV infusion (30 mg/hr Intravenous Infusion Verify 2021/04/29 2000)  vasopressin (PITRESSIN) 20 Units in sodium chloride 0.9 % 100 mL infusion-*FOR SHOCK* (0.03 Units/min Intravenous New Bag/Given 04-29-2021 1502)  polyethylene glycol (MIRALAX / GLYCOLAX) packet 17 g (has no administration in time range)  pantoprazole (PROTONIX) injection 40 mg (has no administration in time range)  fentaNYL (SUBLIMAZE) injection 25 mcg (25 mcg Intravenous Not Given 2021/04/29 1532)  fentaNYL 2531mg in NS 2556m(1081mml) infusion-PREMIX (50 mcg/hr Intravenous Infusion Verify 03/2021-03-3099)  fentaNYL (SUBLIMAZE) bolus via infusion 25-100 mcg (has no administration in time range)  docusate (COLACE) 50 MG/5ML liquid 100 mg (has no administration in time range)  midazolam (VERSED) 100 mg/100 mL (1 mg/mL) premix infusion (2 mg/hr Intravenous Infusion Verify 10/October 31, 202200)  midazolam (VERSED) bolus via infusion 0-5 mg (has no administration in time range)  fentaNYL (SUBLIMAZE) injection 100 mcg (100 mcg Intravenous Not Given 03/2021/03/3107)  norepinephrine (LEVOPHED) 4mg36m 250mL77mmix infusion (15 mcg/min Intravenous Infusion Verify 10/1/Apr 29, 2021)  midazolam (VERSED) injection 1 mg (1 mg Intravenous Not Given 04/20/2021-10-31)  cefTRIAXone (ROCEPHIN) 2 g in sodium chloride 0.9 % 100 mL IVPB (2 g Intravenous New Bag/Given 04/21/2021-10-31)  0.9 %  sodium chloride infusion (has no administration in time range)  chlorhexidine gluconate (MEDLINE KIT) (PERIDEX) 0.12 % solution 15 mL (15 mLs Mouth Rinse Given 10/1/Apr 29, 2021)  MEDLINE mouth rinse (has no administration in time range)  Chlorhexidine Gluconate Cloth 2 % PADS 6 each (has no administration in time range)  EPINEPHrine (ADRENALIN) 1 MG/10ML injection (1 mg Intravenous Given 03/30/09-31-2022)  calcium chloride injection (1 g Intravenous Given 04/29/2021/10/31)  EPINEPhrine 10 mcg/mL Adult  IV Push Syringe (For Blood Pressure Support) (40 mcg Intravenous Given 03/30/09/31/2022)  iohexol (OMNIPAQUE) 350 MG/ML injection 100 mL (100 mLs Intravenous Contrast Given 10/1/Apr 29, 2021)  fentaNYL (SUBLIMAZE) injection 100 mcg (100 mcg Intravenous Given 04/12/2021/10/31)    ED Course  I have reviewed the triage vital signs and the nursing notes.  Pertinent labs & imaging results that were available during my care of the patient were reviewed by me and considered in my medical decision making (see chart for details).  Clinical Course as of 10/01Oct 31, 2022  Sat Oct 010/31/22 show elevated LFTs.  Bicarb is decreased at 16 [JK]  1434 Troponin is elevated at 832 [JK]  1434 CT head without acute findings [JK]  1435 Chest CT findings reviewed.  Small pneumothoraces noted, aspiration noted, rib fractures noted.  All likely related to his CPR.  No PE noted [JK]  1435 Case discussed with cardiology earlier who will come evaluate the patient [JK]    Clinical Course User Index [JK] KnappDorie Rank  MDM Rules/Calculators/A&P                           Patient presented to the ED after cardiac arrest precipitated by chest pain.  ROSC eventually obtained after extensive efforts.  Pt hypotensive in the ED requiring vasopressor support. Amiodarone infusion,  EPI drip, vasopressin added.  CT angio to evaluate for PE.  Negative for PE but small PEs noted, rib fractures, aspiration, related to the resuscitation.  Suspect primary cardiac event.  Central line place.d Cardiology and critical care consulted. Final Clinical Impression(s) / ED Diagnoses Cardiac arrest, Acute myocardia injury, Rib  fractures, Aspiration    Dorie Rank, MD April 07, 2021 2049

## 2021-04-30 DEATH — deceased

## 2021-07-19 ENCOUNTER — Ambulatory Visit: Payer: Medicare PPO | Admitting: Cardiology

## 2021-10-20 IMAGING — DX DG CHEST 2V
3 series · 3 of 3 positions shown · non-contrast
Comparison: Chest radiograph 05/25/2017. Included lung bases from
abdominal CT earlier performed concurrently.

CLINICAL DATA: Reflux.  Weakness.  COVID positive 01/18/2021

EXAM:
CHEST - 2 VIEW

[chest lat (1 of 2)]
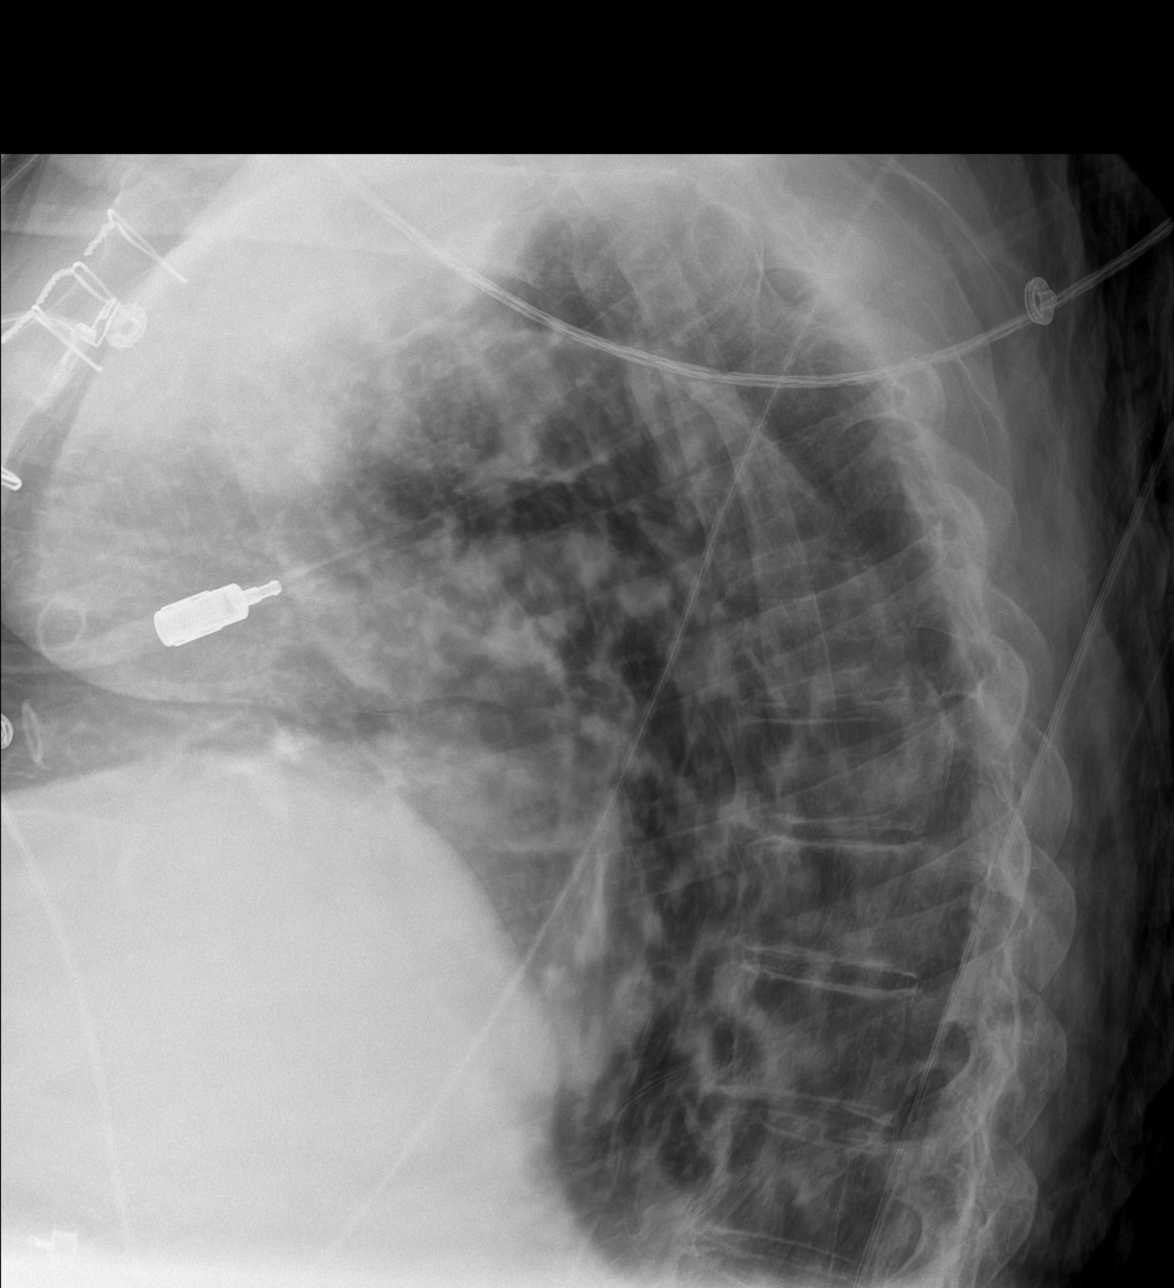

[chest ap]
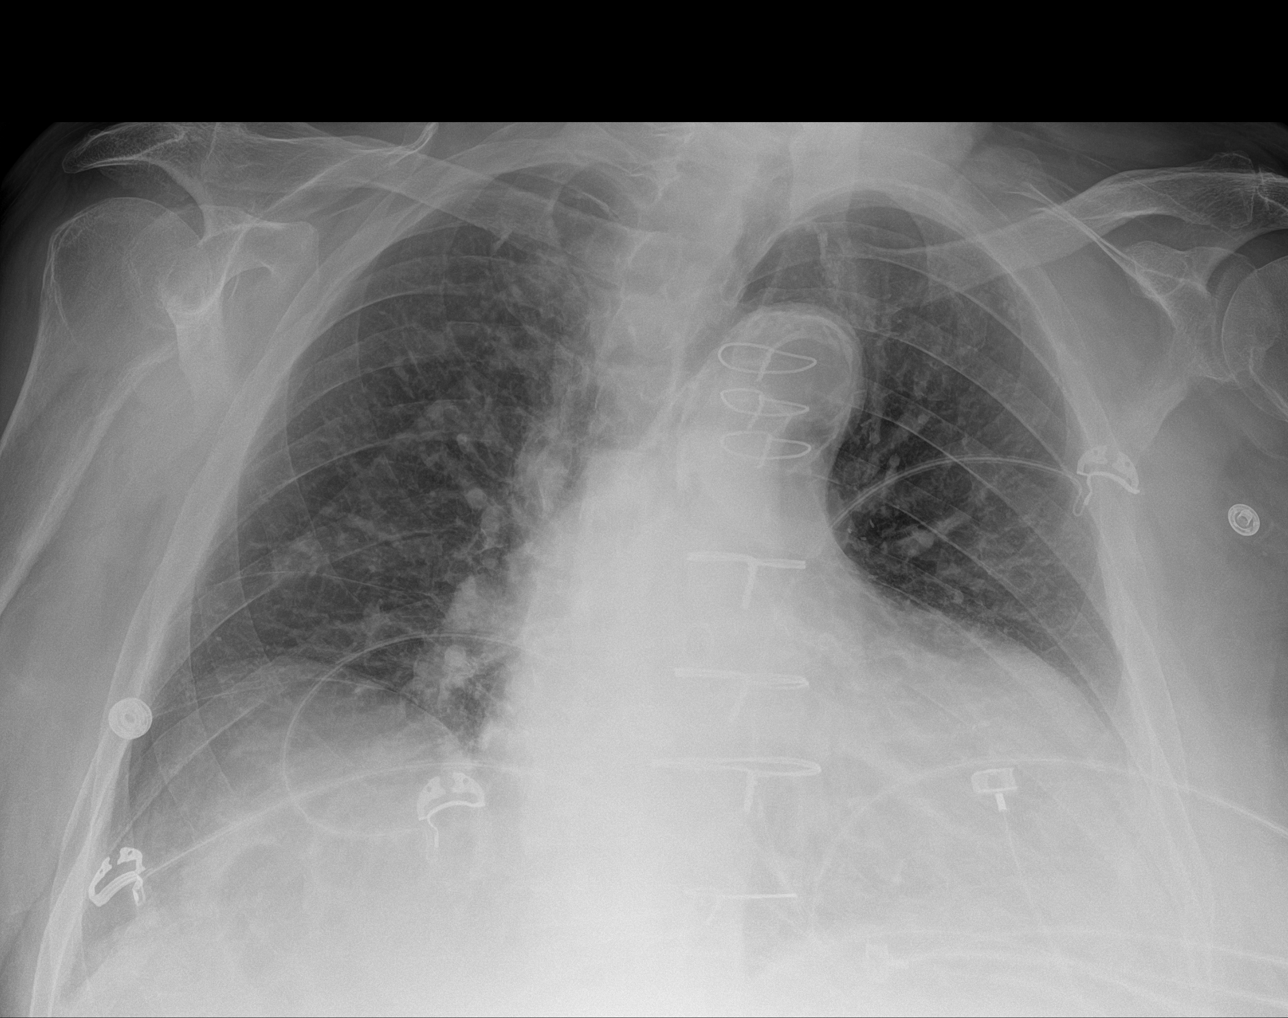

[chest lat (2 of 2)]
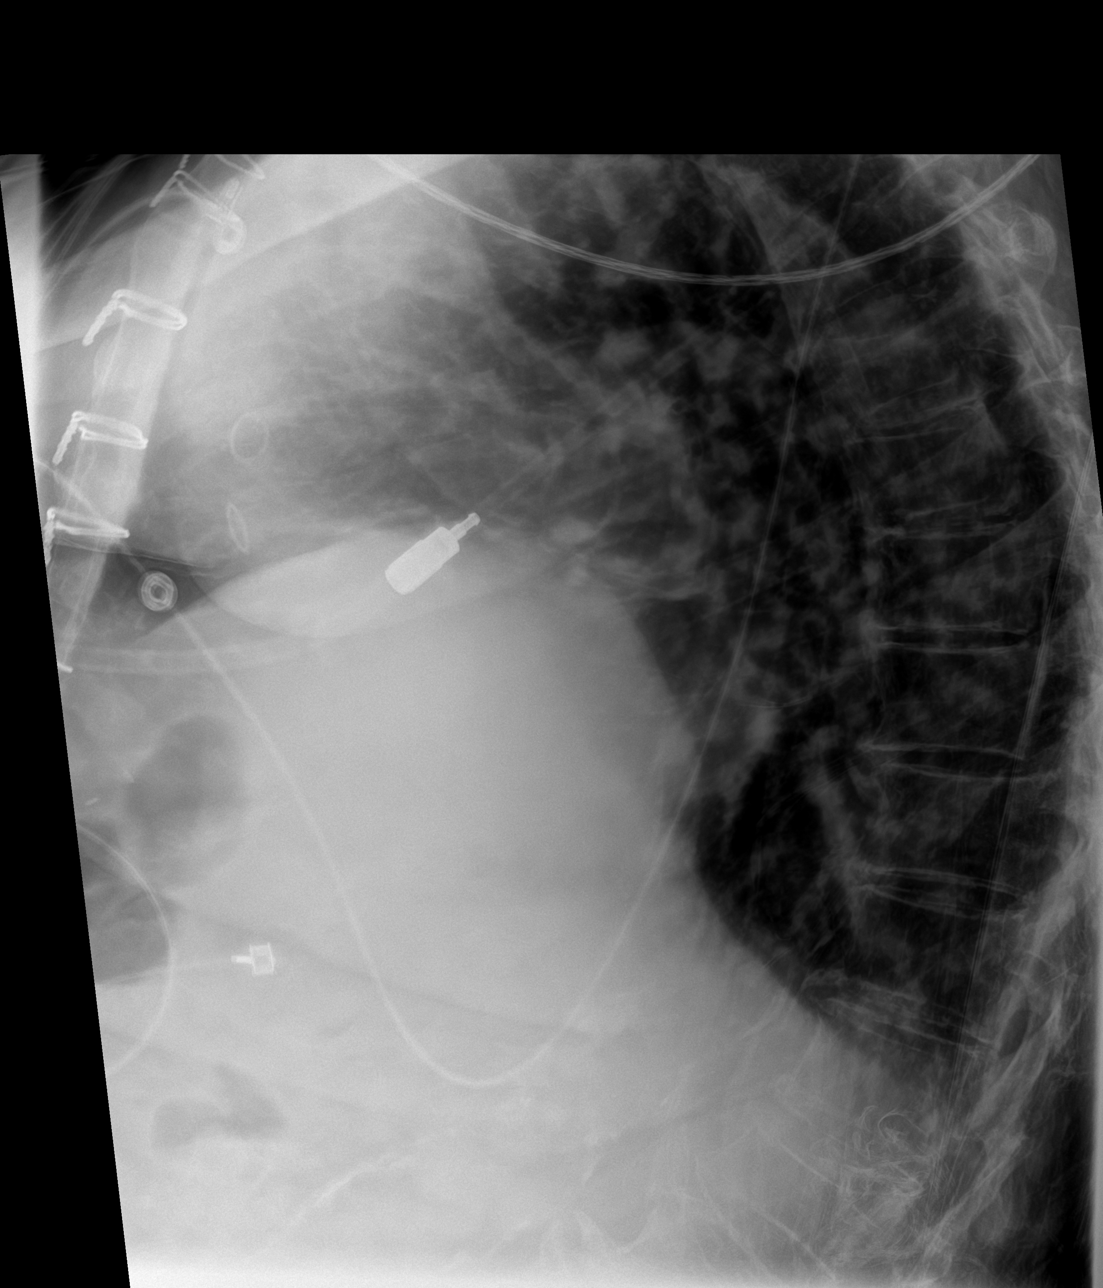

[3 of 3 positions shown; findings below may reference images not displayed]

FINDINGS: Patient is rotated. Eventration of the right hemidiaphragm. Post
median sternotomy. The heart is enlarged. Aortic atherosclerosis.
Scattered atelectasis without confluent airspace disease. No
pulmonary edema stage patient. L1 compression fracture was not
previously included in the field of view.
IMPRESSION: 1. Scattered atelectasis without confluent airspace disease.
2. Cardiomegaly post CABG.  Aortic atherosclerosis.

## 2021-10-20 IMAGING — CT CT ABD-PELV W/ CM
2 of 5 series · 16 of 46 positions shown, 18 images · IV contrast (Omnipaque or Isovue)
Comparison: August 21, 2014

CLINICAL DATA: Abdominal abscess/infection suspected; weakness and
diaphoresis; COVID positive

EXAM:
CT ABDOMEN AND PELVIS WITH CONTRAST
TECHNIQUE: Multidetector CT imaging of the abdomen and pelvis was performed
using the standard protocol following bolus administration of
intravenous contrast.
CONTRAST:  100mL OMNIPAQUE IOHEXOL 300 MG/ML  SOLN

[Series 2: axial st · axial · 0.98mm/px · z∈[+927,+1407]mm · 13 of 108 slices shown, 15 images]
[im 6/108  soft-tissue]
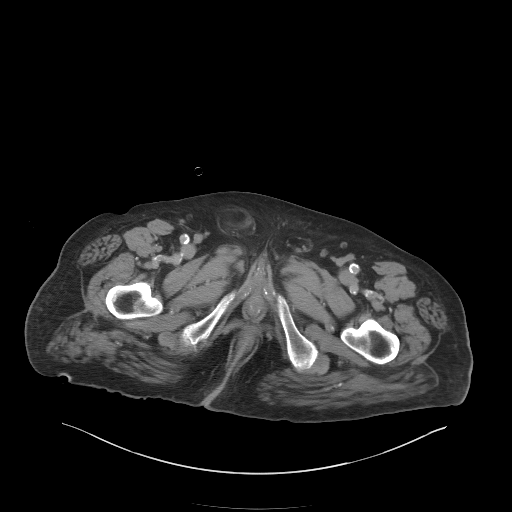
[im 6/108  bone]
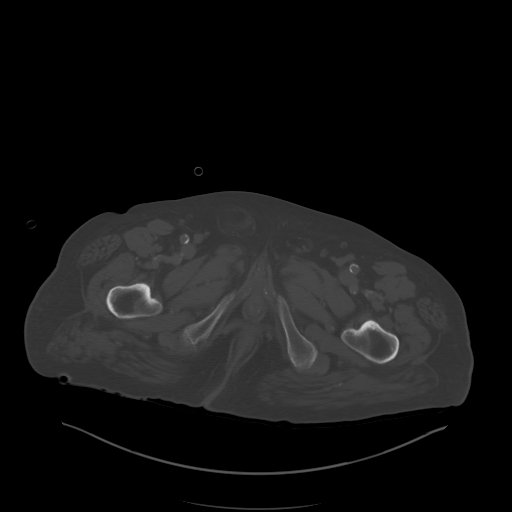
[im 12/108  soft-tissue]
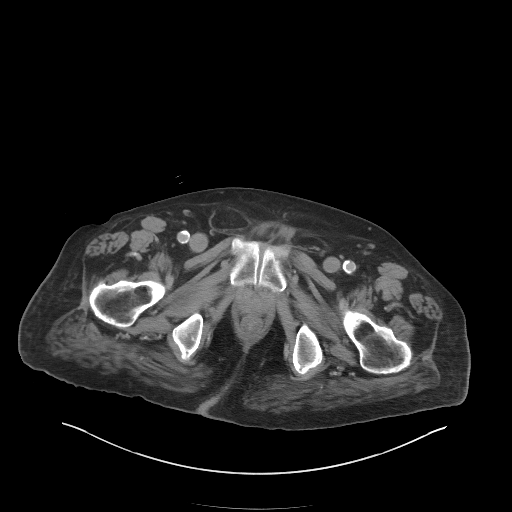
[im 24/108  soft-tissue]
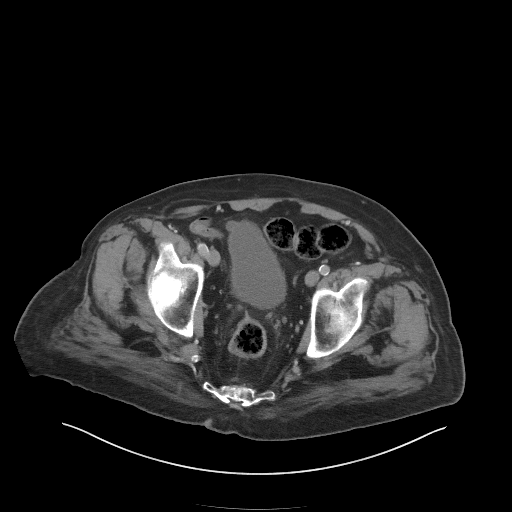
[im 30/108  soft-tissue]
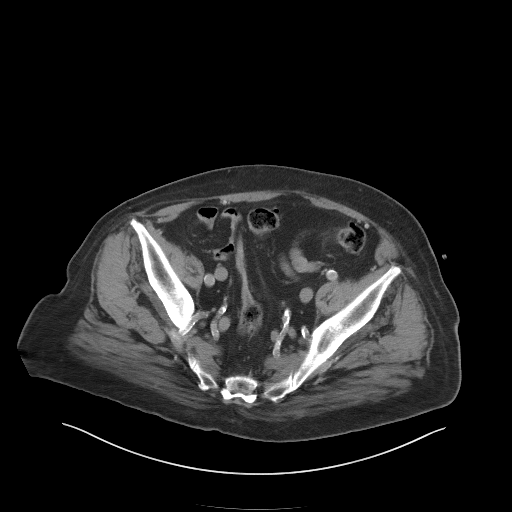
[im 36/108  soft-tissue]
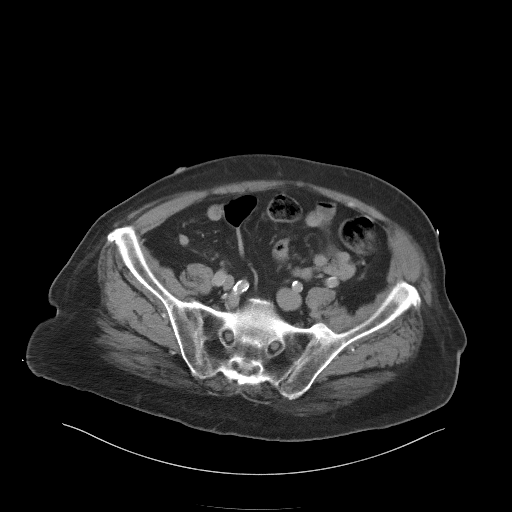
[im 48/108  soft-tissue]
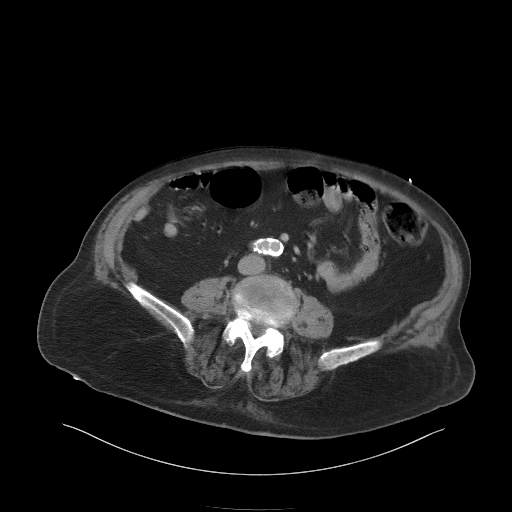
[im 54/108  soft-tissue]
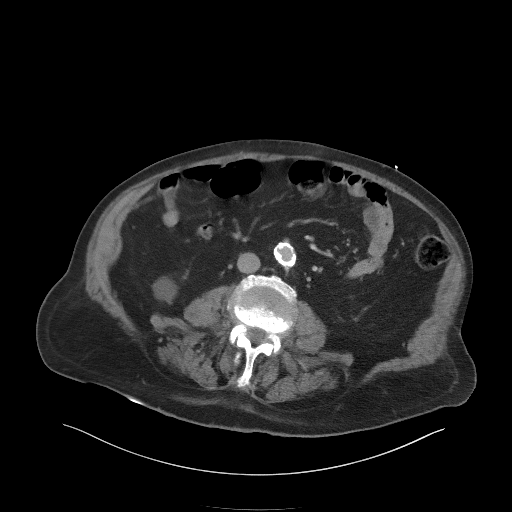
[im 60/108  soft-tissue]
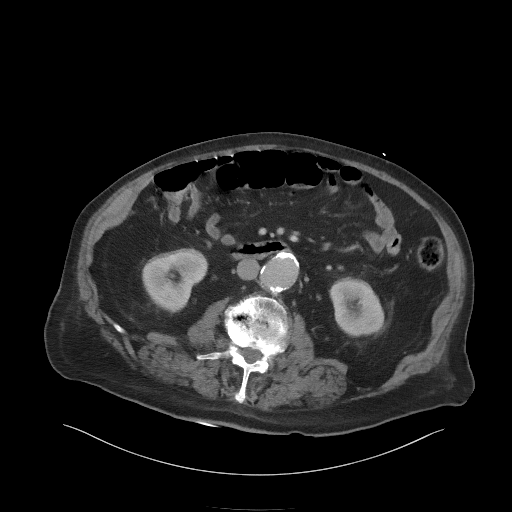
[im 72/108  soft-tissue]
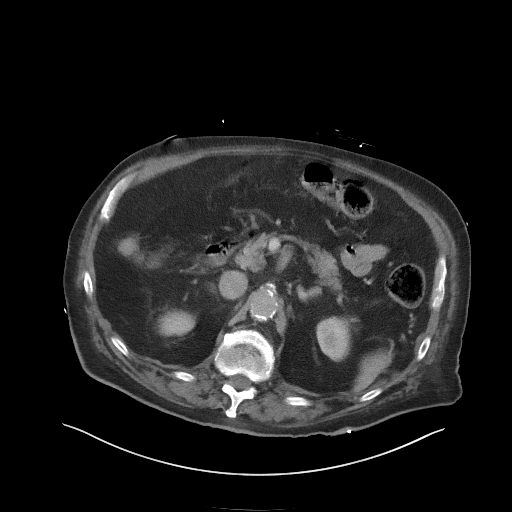
[im 72/108  bone]
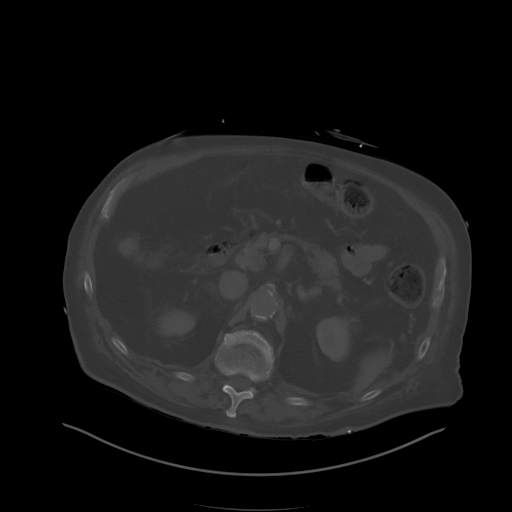
[im 78/108  soft-tissue]
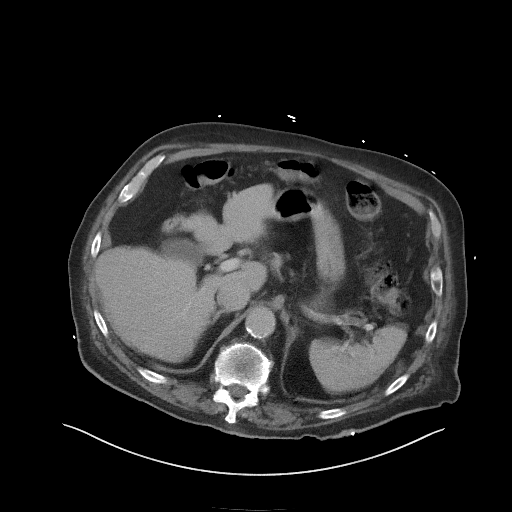
[im 84/108  soft-tissue]
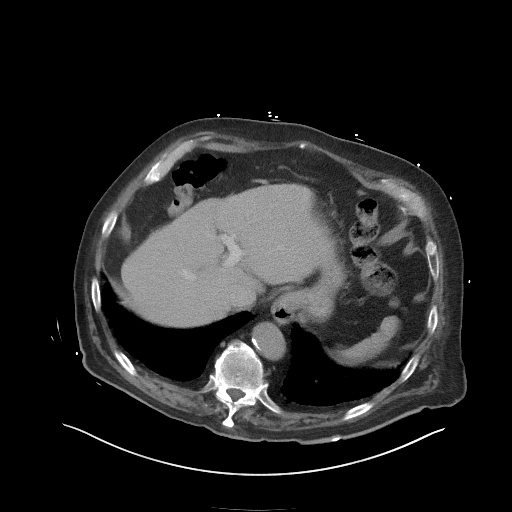
[im 96/108  soft-tissue]
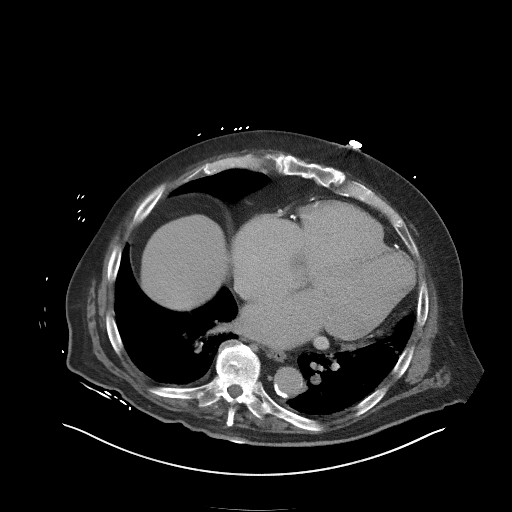
[im 102/108  soft-tissue]
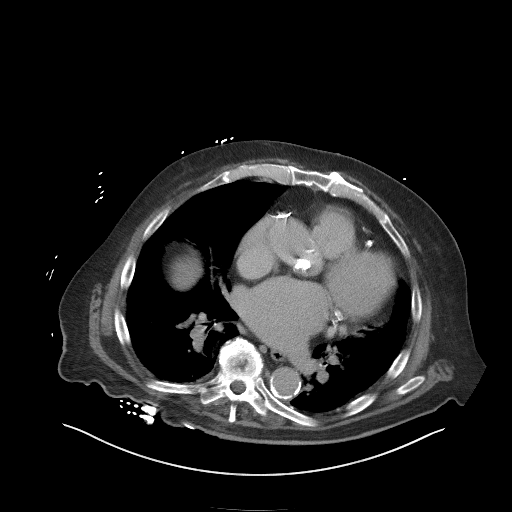

[Series 5: coronal st · coronal · 0.94mm/px · 3 of 113 slices shown]
[im 38/113  soft-tissue]
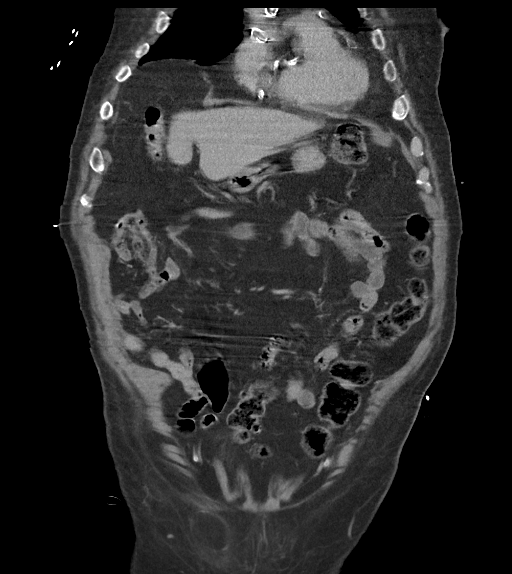
[im 50/113  soft-tissue]
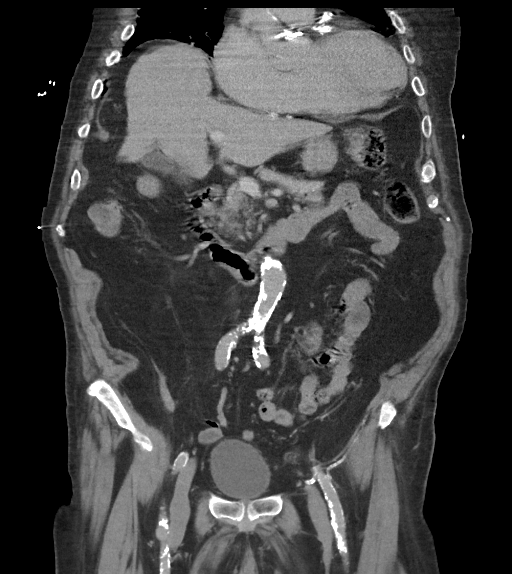
[im 63/113  soft-tissue]
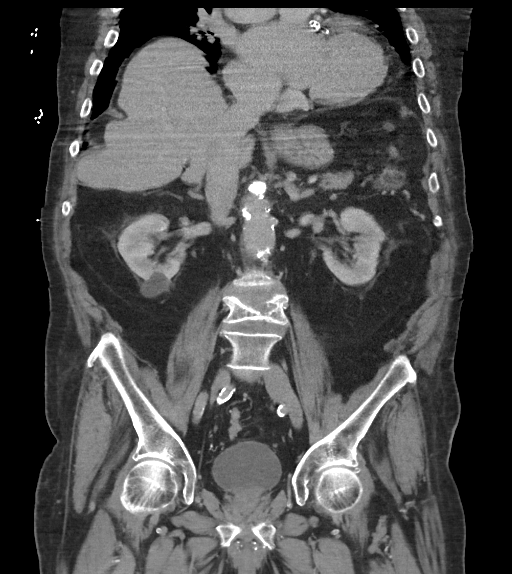

[16 of 46 positions shown; findings below may reference images not displayed]

FINDINGS: Lower chest: Cardiomegaly. Status post median sternotomy and CABG.
Evaluation of the parenchyma is limited secondary to respiratory
motion. There is scattered atelectasis.

Hepatobiliary: Cholelithiasis without evidence of acute
cholecystitis. No intrahepatic or extrahepatic biliary ductal
dilation. Portal vein is patent. No suspicious focal hepatic lesion
within the limitations of artifact from respiratory motion.

Pancreas: Unremarkable. No pancreatic ductal dilatation or
surrounding inflammatory changes.

Spleen: Splenule. No suspicious focal lesion within the limitations
of artifact from respiratory motion.

Adrenals/Urinary Tract: Adrenal glands are unremarkable. Kidneys
enhance symmetrically. No hydronephrosis. Cyst of the inferior pole
of the RIGHT kidney. No nephrolithiasis. Bladder is unremarkable.

Stomach/Bowel: No evidence of bowel obstruction. No evidence of
appendicitis. Small hiatal hernia.

Vascular/Lymphatic: Severe atherosclerotic calcifications of the
aorta. Severe atherosclerotic calcifications of the origin of the
superior mesenteric artery result in moderate to severe narrowing.
Confluent atherosclerotic calcifications of the origin of the celiac
likely results in at least moderate stenosis. Flow seen distally.
There is fusiform aneurysmal dilation of the infrarenal abdominal
aorta to 3.4 x 3.3 cm. No suspicious lymphadenopathy.

Reproductive: Prostate is unremarkable.

Other: Fat containing RIGHT inguinal hernia.

Musculoskeletal: New mild to moderate wedging of L1 since 9336.
progressive advanced degenerative changes the large orals noted at
L3-4.
IMPRESSION: 1. No definitive CT etiology for acute abdominal pain identified.
2. Severe atherosclerotic calcifications at the origins of the
celiac and SMA results in at least moderate to severe stenosis. Flow
seen distally. No CT evidence of bowel ischemia.
3. New mild compression fracture deformity of L1. Recommend
correlation with point tenderness.
4. Infrarenal abdominal aortic aneurysm to 3.4 cm. Recommend
follow-up every 3 years. This recommendation follows ACR consensus
guidelines: White Paper of the ACR Incidental Findings Committee II
on Vascular Findings. [HOSPITAL] [id number]; [DATE].

Aortic aneurysm NOS ([id number].N). Aortic Atherosclerosis
([id number].J).
